# Patient Record
Sex: Female | Born: 1995 | Race: Black or African American | Hispanic: No | Marital: Single | Smoking: Never smoker
Health system: Southern US, Community
[De-identification: ages and names within clinical notes are randomized; demographics above are authoritative.]

## PROBLEM LIST (undated history)

## (undated) ENCOUNTER — Inpatient Hospital Stay (HOSPITAL_COMMUNITY): Payer: Self-pay

## (undated) ENCOUNTER — Ambulatory Visit: Admission: EM | Disposition: A | Discharge status: Home / Self Care | Payer: BC Managed Care – PPO

## (undated) DIAGNOSIS — O24419 Gestational diabetes mellitus in pregnancy, unspecified control: Secondary | ICD-10-CM

## (undated) DIAGNOSIS — E871 Hypo-osmolality and hyponatremia: Secondary | ICD-10-CM

## (undated) DIAGNOSIS — I959 Hypotension, unspecified: Secondary | ICD-10-CM

## (undated) DIAGNOSIS — F419 Anxiety disorder, unspecified: Secondary | ICD-10-CM

## (undated) HISTORY — DX: Hypo-osmolality and hyponatremia: E87.1

## (undated) HISTORY — DX: Hypotension, unspecified: I95.9

---
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
## 2018-02-21 ENCOUNTER — Other Ambulatory Visit: Payer: Self-pay

## 2018-02-21 ENCOUNTER — Emergency Department (HOSPITAL_COMMUNITY): Payer: No Typology Code available for payment source

## 2018-02-21 ENCOUNTER — Emergency Department (HOSPITAL_COMMUNITY)
Admission: EM | Admit: 2018-02-21 | Discharge: 2018-02-21 | Disposition: A | Discharge status: Home / Self Care | Payer: No Typology Code available for payment source | Attending: Emergency Medicine | Admitting: Emergency Medicine

## 2018-02-21 ENCOUNTER — Encounter (HOSPITAL_COMMUNITY): Payer: Self-pay | Admitting: Emergency Medicine

## 2018-02-21 DIAGNOSIS — S29019A Strain of muscle and tendon of unspecified wall of thorax, initial encounter: Secondary | ICD-10-CM

## 2018-02-21 DIAGNOSIS — S39012A Strain of muscle, fascia and tendon of lower back, initial encounter: Secondary | ICD-10-CM | POA: Diagnosis not present

## 2018-02-21 DIAGNOSIS — Y998 Other external cause status: Secondary | ICD-10-CM | POA: Insufficient documentation

## 2018-02-21 DIAGNOSIS — Y92410 Unspecified street and highway as the place of occurrence of the external cause: Secondary | ICD-10-CM | POA: Diagnosis not present

## 2018-02-21 DIAGNOSIS — Y939 Activity, unspecified: Secondary | ICD-10-CM | POA: Diagnosis not present

## 2018-02-21 DIAGNOSIS — S29012A Strain of muscle and tendon of back wall of thorax, initial encounter: Secondary | ICD-10-CM | POA: Insufficient documentation

## 2018-02-21 DIAGNOSIS — S299XXA Unspecified injury of thorax, initial encounter: Secondary | ICD-10-CM | POA: Diagnosis present

## 2018-02-21 MED ORDER — DIAZEPAM 5 MG PO TABS
5.0000 mg | ORAL_TABLET | Freq: Once | ORAL | Status: AC
  Administered 2018-02-21: 5 mg via ORAL
  Filled 2018-02-21: qty 1

## 2018-02-21 MED ORDER — IBUPROFEN 600 MG PO TABS: 600.0000 mg | ORAL_TABLET | Freq: Four times a day (QID) | ORAL | 0 refills | Status: DC | PRN

## 2018-02-21 MED ORDER — KETOROLAC TROMETHAMINE 30 MG/ML IJ SOLN
30.0000 mg | Freq: Once | INTRAMUSCULAR | Status: AC
  Administered 2018-02-21: 30 mg via INTRAMUSCULAR
  Filled 2018-02-21: qty 1

## 2018-02-21 MED ORDER — DIAZEPAM 5 MG PO TABS: 5.0000 mg | ORAL_TABLET | Freq: Two times a day (BID) | ORAL | 0 refills | Status: DC

## 2018-02-21 MED ORDER — HYDROCODONE-ACETAMINOPHEN 5-325 MG PO TABS: 1.0000 | ORAL_TABLET | ORAL | 0 refills | Status: DC | PRN
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
## 2018-05-27 ENCOUNTER — Ambulatory Visit: Payer: Self-pay

## 2018-05-27 NOTE — Telephone Encounter (Signed)
Incoming call from Patient who comlains of having a period, then it stops  Then Patient reports  That she had another period heavy,which included heavy bleeding with mucous.  Very painfull Rates it moderate to severe. Recommended that Patient consult  With GYN.  For further evaluation.  Provided Care advise. Patient reports that She has no Money for the Dr. Melynda Keller transportation.  This occurred 2 to 3 weeks ago  Reason for Disposition . Passed tissue (e.g., gray-white)  Answer Assessment - Initial Assessment Questions 1. LOCATION: "Where does it hurt?"      vaginal 2. ONSET: "When did this episode of pain begin?"       2to 3 weeks ago 3. SEVERITY: "How bad is the pain?" "Are you missing school or work because of the pain?"  (e.g., Scale 1-10; mild, moderate, or severe)   - MILD (1-3): doesn't interfere with normal activities, lasting 1-2 days    - MODERATE (4-7): interferes with normal activities (missing work or school), lasting 2-3 days, some associated GI symptoms    - SEVERE (8-10): excruciating pain, lasting 2-7 days, associated GI symptoms, pain radiating into thighs and back    Severe 4. VAGINAL BLEEDING: "Describe the bleeding that you are having." "How much bleeding is there?"    - SPOTTING: spotting, or pinkish / brownish mucous discharge; does not fill panti-liner or pad    - MILD:  less than 1 pad / hour; less than patient's usual menstrual bleeding   - MODERATE: 1-2 pads / hour; small-medium blood clots (e.g., pea, grape, small coin)    - SEVERE: soaking 2 or more pads/hour for 2 or more hours; bleeding not contained by pads or continuous red blood from vagina; large blood clots (e.g., golf ball, large coin)     More than moderate.   5. MENSTRUAL HISTORY:  "When did this menstrual period begin?", "Is this a normal period for you?"      no 6. LMP:  "When did your last menstrual period begin?"    denies 7. OTHER SYMPTOMS: "What other symptoms are you having with the pain?" (e.g.,  fever, dizzy/lighthead, vomiting, diarrhea, vaginal discharge)    denie8. PREGNANCY: "Is there any chance you are pregnant?" (e.g., unprotected intercourse, missed birth control pill, broken condom)     Had not felt pregnacy Sx.   At all  Protocols used: ABORTION - THREATENED MISCARRIAGE FOLLOW-UP CALL-A-AH, ABDOMINAL PAIN - MENSTRUAL CRAMPS-A-AH

## 2018-08-25 ENCOUNTER — Ambulatory Visit: Payer: Self-pay | Admitting: *Deleted

## 2018-08-25 NOTE — Telephone Encounter (Signed)
Pt reports irregular periods, occurring every 2 weeks, some heaver than others. States this AM mild-moderate bleeding, no clots. Pt reports last month passed large piece of tissue. Had been taking BC pills until 2 months ago, stopped, periods then became irregular. Denies fever, no dizziness or weakness. Reporst 7-8/10 abdominal cramping. Pt does not have PCP or Gyne.  States she will go to [facility] for advise, recommendations. Care advise given per protocol. Pt verbalizes understanding.  Reason for Disposition . [1] Bleeding or spotting between regular periods AND [2] occurs more than three cycles (3 months) this past year  Answer Assessment - Initial Assessment Questions 1. AMOUNT: "Describe the bleeding that you are having."    - SPOTTING: spotting, or pinkish / brownish mucous discharge; does not fill panti-liner or pad    - MILD:  less than 1 pad / hour; less than patient's usual menstrual bleeding   - MODERATE: 1-2 pads / hour; 1 menstrual cup every 6 hours; small-medium blood clots (e.g., pea, grape, small coin)   - SEVERE: soaking 2 or more pads/hour for 2 or more hours; 1 menstrual cup every 2 hours; bleeding not contained by pads or continuous red blood from vagina; large blood clots (e.g., golf ball, large coin)      Mild to moderate today 2. ONSET: "When did the bleeding begin?" "Is it continuing now?"     This AM. Had menses 2 weeks ago 3. MENSTRUAL PERIOD: "When was the last normal menstrual period?" "How is this different than your period?"     2 months ago 4. REGULARITY: "How regular are your periods?"     Regular until 2 months ago 5. ABDOMINAL PAIN: "Do you have any pain?" "How bad is the pain?"  (e.g., Scale 1-10; mild, moderate, or severe)   - MILD (1-3): doesn't interfere with normal activities, abdomen soft and not tender to touch    - MODERATE (4-7): interferes with normal activities or awakens from sleep, tender to touch    - SEVERE (8-10): excruciating pain,  doubled over, unable to do any normal activities      Moderate 7-8/10 6. PREGNANCY: "Could you be pregnant?" "Are you sexually active?" "Did you recently give birth?"     no 7. BREASTFEEDING: "Are you breastfeeding?"     no 8. HORMONES: "Are you taking any hormone medications, prescription or OTC?" (e.g., birth control pills, estrogen)     No. Stopped BC 2 months ago. Took  9. BLOOD THINNERS: "Do you take any blood thinners?" (e.g., Coumadin/warfarin, Pradaxa/dabigatran, aspirin)     no 10. CAUSE: "What do you think is causing the bleeding?" (e.g., recent gyn surgery, recent gyn procedure; known bleeding disorder, cervical cancer, polycystic ovarian disease, fibroids)         Not sure 11. HEMODYNAMIC STATUS: "Are you weak or feeling lightheaded?" If so, ask: "Can you stand and walk normally?"        no 12. OTHER SYMPTOMS: "What other symptoms are you having with the bleeding?" (e.g., passed tissue, vaginal discharge, fever, menstrual-type cramps)      Cramps. Did pass large piece of tissue last month after not having period for 2 months. "Blood is different, very light in color."  Protocols used: VAGINAL BLEEDING - ABNORMAL-A-AH

## 2018-08-26 ENCOUNTER — Emergency Department (HOSPITAL_COMMUNITY)
Admission: EM | Admit: 2018-08-26 | Discharge: 2018-08-26 | Disposition: A | Discharge status: Home / Self Care | Payer: Self-pay | Attending: Emergency Medicine | Admitting: Emergency Medicine

## 2018-08-26 ENCOUNTER — Other Ambulatory Visit: Payer: Self-pay

## 2018-08-26 ENCOUNTER — Emergency Department (HOSPITAL_COMMUNITY): Payer: Self-pay

## 2018-08-26 DIAGNOSIS — K649 Unspecified hemorrhoids: Secondary | ICD-10-CM | POA: Insufficient documentation

## 2018-08-26 DIAGNOSIS — R55 Syncope and collapse: Secondary | ICD-10-CM | POA: Insufficient documentation

## 2018-08-26 DIAGNOSIS — R079 Chest pain, unspecified: Secondary | ICD-10-CM | POA: Insufficient documentation

## 2018-08-26 DIAGNOSIS — F419 Anxiety disorder, unspecified: Secondary | ICD-10-CM | POA: Insufficient documentation

## 2018-08-26 DIAGNOSIS — N939 Abnormal uterine and vaginal bleeding, unspecified: Secondary | ICD-10-CM | POA: Insufficient documentation

## 2018-08-26 DIAGNOSIS — K644 Residual hemorrhoidal skin tags: Secondary | ICD-10-CM

## 2018-08-26 LAB — CBC
HCT: 41.5 % (ref 36.0–46.0)
Hemoglobin: 13.2 g/dL (ref 12.0–15.0)
MCH: 30.7 pg (ref 26.0–34.0)
MCHC: 31.8 g/dL (ref 30.0–36.0)
MCV: 96.5 fL (ref 80.0–100.0)
Platelets: 224 10*3/uL (ref 150–400)
RBC: 4.3 MIL/uL (ref 3.87–5.11)
RDW: 12.6 % (ref 11.5–15.5)
WBC: 8.5 10*3/uL (ref 4.0–10.5)
nRBC: 0 % (ref 0.0–0.2)

## 2018-08-26 LAB — URINALYSIS, ROUTINE W REFLEX MICROSCOPIC
Bacteria, UA: NONE SEEN
Bilirubin Urine: NEGATIVE
Glucose, UA: NEGATIVE mg/dL
Ketones, ur: NEGATIVE mg/dL
Leukocytes,Ua: NEGATIVE
Nitrite: NEGATIVE
Protein, ur: 100 mg/dL — AB
RBC / HPF: >50 RBC/hpf — ABNORMAL HIGH (ref 0–5)
Specific Gravity, Urine: 1.025 (ref 1.005–1.030)
pH: 6 (ref 5.0–8.0)

## 2018-08-26 LAB — CBG MONITORING, ED: Glucose-Capillary: 103 mg/dL — ABNORMAL HIGH (ref 70–99)

## 2018-08-26 LAB — BASIC METABOLIC PANEL
Anion gap: 8 (ref 5–15)
BUN: 11 mg/dL (ref 6–20)
CO2: 24 mmol/L (ref 22–32)
Calcium: 9.1 mg/dL (ref 8.9–10.3)
Chloride: 106 mmol/L (ref 98–111)
Creatinine, Ser: 0.66 mg/dL (ref 0.44–1.00)
GFR calc Af Amer: >60 mL/min (ref >60)
GFR calc non Af Amer: >60 mL/min (ref >60)
Glucose, Bld: 148 mg/dL — ABNORMAL HIGH (ref 70–99)
Potassium: 4.4 mmol/L (ref 3.5–5.1)
Sodium: 138 mmol/L (ref 135–145)

## 2018-08-26 LAB — I-STAT BETA HCG BLOOD, ED (MC, WL, AP ONLY): I-stat hCG, quantitative: <5 m[IU]/mL (ref <5)

## 2018-08-26 MED ORDER — SODIUM CHLORIDE 0.9% FLUSH: 3.0000 mL | Freq: Once | INTRAVENOUS | Status: DC

## 2018-08-26 MED ORDER — NORGESTIMATE-ETH ESTRADIOL 0.25-35 MG-MCG PO TABS: 1.0000 | ORAL_TABLET | Freq: Every day | ORAL | 11 refills | Status: DC

## 2018-08-26 MED ORDER — HYDROXYZINE HCL 25 MG PO TABS: 25.0000 mg | ORAL_TABLET | Freq: Four times a day (QID) | ORAL | 0 refills | Status: DC

## 2018-08-26 NOTE — ED Provider Notes (Signed)
Patient seen/examined in the Emergency Department in conjunction with Midlevel Provider McDonald Patient reports syncopal episode Exam : awake/alert, no distress, resting comfortably Plan: low risk for cardiac dysrhythmia Will d/c home    Ripley Fraise, MD 08/26/18 2352
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
## 2018-08-26 NOTE — Discharge Instructions (Addendum)
Thank you for allowing me to care for you today in the Emergency Department.   Make sure that you are drinking plenty of fluids, at least 64 of water daily.  You should also make sure that you are drinking plenty of fiber so that your bowel movements are soft and you do not have to strain.  Straining will make the hemorrhoid worse.  Start taking 1 tablet of Sprintec daily for your irregular periods.  You can follow-up with the walk-in gynecology clinic.  You can follow-up with [facility] for the episodes that sound consistent with panic attacks.  If you are feeling more anxious or feel 1 of these episodes coming on, you can try taking 1 tablet of hydroxyzine every 6 hours for anxiety.  Make sure that you are not taking this medication while you are drinking alcohol.  I have also provided you with 3 outpatient referrals to follow-up regarding the episodes of passing out that you have been having.  These are all affiliated with the hospital and do not require you to have insurance.  Please call their office to schedule follow-up appointment.  Return to the emergency department if you develop respiratory distress, if you pass out and have new weakness, numbness, changes in your vision, chest pain and shortness of breath that do not resolve, or other new, concerning symptoms.

## 2018-08-26 NOTE — ED Provider Notes (Signed)
[facility] EMERGENCY [facility]PART[facility] Provider Note   CSN: [id number] Arrival date & time: 08/26/18  1644    History   Chief Complaint Chief Complaint  Patient presents with  . Loss of Consciousness    HPI Terri Ramirez is a 23 y.o. female who presents to the emergency department with a chief complaint of syncope.  The patient reports that she had 2 syncopal episodes earlier today.  She reports that both episodes happened earlier today while she was showering.  She reports that she has been having frequent syncopal episodes over the last few months.  She reports that she has had similar episodes while straining on the toilet.  She reports that she had prodromal symptoms prior to the episode coming on and was able to go from a standing position to "kneeling down" into the bathtub.  She denies hitting her head, nausea, vomiting, or headache.  She also reports that she has been having recurrent episodes of chest pain and shortness of breath over the last few months.  She reports that the symptoms come on suddenly and resolve spontaneously within 20 minutes. She characterizes the pain as tightness and having difficulty breathing and states "I feel like I'm going to die."  She reports that there is no correlation between these symptoms and syncopal episodes.  She also reports that she has been having heavy menstrual cycles for the last 2 months.  She was taking an OCP, but discontinue the medication.  Shortly after stopping this medication, she began having heavy, longer cycles accompanied by nausea, and is severe bilateral lower abdominal cramps.  She has not established with an OB/GYN.  She reports that she is not currently sexually active and has previously only been sexually active with one female partner.  She has no concerns for STIs at this time.  She is unable to quantify the amount of vaginal bleeding that she has been experiencing. She does note that she has been more  constipated and has been straining to have BMs and has noticed a small "bump" near her rectum.  She reports worsening depression over the last few months she recently went through a break-up.  She reports that she has had decreased p.o. intake and sometimes will not eat for a day or 2.  She states that for several weeks that she felt so weak that she could barely get off the couch.  During this time, she also endorses heavy, daily alcohol use, but reports that alcohol use has significantly subsided over the last month.  No other IV recreational drug use.  She denies fever, chills, shortness of breath at this time, visual changes, dizziness, lightheadedness, leg swelling, palpitations, diarrhea, hematochezia, melena, or vomiting.  She has not established with a therapist or psychiatrist.  No history of anxiety or depression.  She reports that she has not spent a significant amount of time outside in the heat.  She has no chronic medical problems and takes no daily medications.     The history is provided by the patient. No language interpreter was used.    No past medical history on file.  There are no active problems to display for this patient.   No past surgical history on file.   OB History   No obstetric history on file.      Home Medications    Prior to Admission medications   Medication Sig Start Date End Date Taking? Authorizing Provider  diazepam (VALIUM) 5 MG tablet Take 1 tablet (  5 mg total) by mouth 2 (two) times daily. 02/21/18   Isla Pence, MD  HYDROcodone-acetaminophen (NORCO/VICODIN) 5-325 MG tablet Take 1 tablet by mouth every 4 (four) hours as needed. 02/21/18   Isla Pence, MD  hydrOXYzine (ATARAX/VISTARIL) 25 MG tablet Take 1 tablet (25 mg total) by mouth every 6 (six) hours. 08/26/18   Makenzy Krist A, PA-C  ibuprofen (ADVIL,MOTRIN) 600 MG tablet Take 1 tablet (600 mg total) by mouth every 6 (six) hours as needed. 02/21/18   Isla Pence, MD   norgestimate-ethinyl estradiol ([place] 28) 0.25-35 MG-MCG tablet Take 1 tablet by mouth daily. 08/26/18   Tyffani Foglesong A, PA-C    Family History No family history on file.  Social History Social History   Tobacco Use  . Smoking status: Not on file  Substance Use Topics  . Alcohol use: Not on file  . Drug use: Not on file     Allergies   Patient has no known allergies.   Review of Systems Review of Systems  Constitutional: Negative for activity change, chills and fever.  HENT: Negative for congestion, sinus pressure, sinus pain and sore throat.   Eyes: Negative for visual disturbance.  Respiratory: Positive for shortness of breath. Negative for cough and wheezing.   Cardiovascular: Positive for chest pain. Negative for palpitations.  Gastrointestinal: Positive for abdominal pain, constipation and nausea. Negative for abdominal distention, anal bleeding, blood in stool, diarrhea and vomiting.  Genitourinary: Positive for menstrual problem and vaginal bleeding. Negative for dysuria, flank pain, frequency, urgency, vaginal discharge and vaginal pain.  Musculoskeletal: Negative for arthralgias, back pain, joint swelling, myalgias, neck pain and neck stiffness.  Skin: Negative for rash.  Allergic/Immunologic: Negative for immunocompromised state.  Neurological: Negative for dizziness, weakness, numbness and headaches.  Psychiatric/Behavioral: Positive for dysphoric mood. Negative for confusion, self-injury and sleep disturbance. The patient is nervous/anxious. The patient is not hyperactive.    Physical Exam Updated Vital Signs BP 120/78   Pulse 65   Temp 98.2 F (36.8 C) (Oral)   Resp 15   LMP  (LMP Unknown)   SpO2 100%   Physical Exam Vitals signs and nursing note reviewed.  Constitutional:      General: She is not in acute distress. HENT:     Head: Normocephalic.  Eyes:     Conjunctiva/sclera: Conjunctivae normal.  Neck:     Musculoskeletal: Neck supple.   Cardiovascular:     Rate and Rhythm: Normal rate and regular rhythm.     Pulses: Normal pulses.          Radial pulses are 2+ on the right side and 2+ on the left side.       Dorsalis pedis pulses are 2+ on the right side and 2+ on the left side.     Heart sounds: Normal heart sounds. No murmur. No friction rub. No gallop.   Pulmonary:     Effort: Pulmonary effort is normal. No respiratory distress.     Breath sounds: No stridor. No wheezing, rhonchi or rales.  Chest:     Chest wall: No tenderness.  Abdominal:     General: There is no distension.     Palpations: Abdomen is soft. There is no mass.     Tenderness: There is no right CVA tenderness, left CVA tenderness, guarding or rebound.     Hernia: No hernia is present.     Comments: Abdomen is soft, nondistended.  Normoactive bowel sounds.  Minimal discomfort with palpation in the bilateral lower  quadrants.  No rebound or guarding.  No tenderness over McBurney's point.  Genitourinary:    Comments: There is a small, nonthrombosed external hemorrhoid. Musculoskeletal:     Right lower leg: No edema.     Left lower leg: No edema.  Skin:    General: Skin is warm.     Findings: No rash.  Neurological:     Mental Status: She is alert.     Comments: Gait is not ataxic.  No focal neurologic deficits.  Psychiatric:        Thought Content: Thought content does not include homicidal or suicidal ideation.     Comments: Anxious appearing      ED Treatments / Results  Labs (all labs ordered are listed, but only abnormal results are displayed) Labs Reviewed  BASIC METABOLIC PANEL - Abnormal; Notable for the following components:      Result Value   Glucose, Bld 148 (*)    All other components within normal limits  URINALYSIS, ROUTINE W REFLEX MICROSCOPIC - Abnormal; Notable for the following components:   Color, Urine AMBER (*)    APPearance CLOUDY (*)    Hgb urine dipstick LARGE (*)    Protein, ur 100 (*)    RBC / HPF >50 (*)     All other components within normal limits  CBG MONITORING, ED - Abnormal; Notable for the following components:   Glucose-Capillary 103 (*)    All other components within normal limits  CBC  I-STAT BETA HCG BLOOD, ED ([facility], [facility], [facility] ONLY)    EKG EKG Interpretation  Date/Time:  Wednesday August 26 2018 17:16:20 EDT Ventricular Rate:  71 PR Interval:  114 QRS Duration: 90 QT Interval:  374 QTC Calculation: 406 R Axis:   81 Text Interpretation:  Normal sinus rhythm with sinus arrhythmia Normal ECG No previous ECGs available Confirmed by Zadie RhineWickline, Donald ([id number]) on 08/26/2018 11:08:45 PM   Radiology Dg Chest 2 View  Result Date: 08/26/2018 CLINICAL DATA:  Shortness of breath EXAM: CHEST - 2 VIEW COMPARISON:  None. FINDINGS: The heart size and mediastinal contours are within normal limits. Both lungs are clear. The visualized skeletal structures are unremarkable. IMPRESSION: No acute cardiopulmonary process. Electronically Signed   By: Jonna ClarkBindu  Avutu M.D.   On: 08/26/2018 23:26    Procedures Procedures (including critical care time)  Medications Ordered in ED Medications  sodium chloride flush (NS) 0.9 % injection 3 mL (has no administration in time range)     Initial Impression / Assessment and Plan / ED Course  I have reviewed the triage vital signs and the nursing notes.  Pertinent labs & imaging results that were available during my care of the patient were reviewed by me and considered in my medical decision making (see chart for details).        23 year old female with no pertinent past medical history presenting to the ER with multiple complaints.  She has been having recurrent syncopal episodes for the last few months and had 2 episodes earlier today while in the shower.  No focal neurologic deficits on exam.  EKG with sinus arrhythmia, but otherwise unremarkable.  Labs are reassuring.  Strong suspicion for vasovagal syncope given recurrent episodes.  I suspect the symptoms  are exacerbated by poor p.o. intake and depression after the patient recently went through a break-up over the last few months.  She reports that she is also been constipated and did have an external hemorrhoid that she asked me to evaluate on her  exam.  Patient was strongly encouraged to increase her fluid intake as well as increasing the amount of fiber in her diet.  She was ambulated in the department and did not have any dizziness or lightheadedness.  She has also been having abnormal uterine bleeding for the last few months since discontinuing her home OCP.  Hemoglobin is normal today.  Will restart the patient on OCP and provide her with follow-up to the walk-in gynecology clinic.  Work-up is not concerning for symptomatic anemia.  She also has been having episodes of chest pain or shortness of breath over the last few months that resolved within 20 minutes.  She is asymptomatic at this time.  Symptoms sound very concerning for recurrent panic attacks.  She does seem very anxious on exam today and has endorsed worsening depression after going through a break-up for the last few months.  Of note, the symptoms are not associated with syncopal episodes that she has been having.  Chest x-ray was unremarkable.  Symptoms do not sound consistent with ACS.  She is PERC negative.  Will start the patient on hydroxyzine and provide her with a follow-up to monitor.   The patient was seen and independently evaluated by Dr. Bebe ShaggyWickline, attending physician.  She is hemodynamically stable and in no acute distress.  Safe for discharge home with outpatient follow-up.  Final Clinical Impressions(s) / ED Diagnoses   Final diagnoses:  Syncope, unspecified syncope type  Abnormal uterine and vaginal bleeding, unspecified  External hemorrhoid  Anxiety    ED Discharge Orders         Ordered    norgestimate-ethinyl estradiol ([place] 28) 0.25-35 MG-MCG tablet  Daily     08/26/18 2307    hydrOXYzine (ATARAX/VISTARIL)  25 MG tablet  Every 6 hours     08/26/18 2307           Frederik PearMcDonald, Airik Goodlin A, PA-C 08/27/18 0125    Zadie RhineWickline, Donald, MD 08/27/18 [phone]

## 2018-08-26 NOTE — ED Notes (Signed)
Discharge instructions discussed with pt. Pt verbalized understanding. No questions at this time. Pt to go home with brother.

## 2018-08-26 NOTE — ED Triage Notes (Signed)
Pt reports while in the shower she had a syncope episode. Pt reports irregular periods and in the last 2 months has had 2 full menstrual periods and just restarted yesterday. Pt currently has generalized abd pain with nausea as well.

## 2018-08-29 ENCOUNTER — Telehealth: Payer: Self-pay

## 2018-08-29 NOTE — Telephone Encounter (Signed)
Received call from ED CSW that patient was having difficulty getting Rx's. Called patient and she said when she went through the drive through to get her prescriptions they were not ready. She stated she was not aware she needed a prescription for them. Per ED records, prescriptions were printed. CM informed her that they should be with her DC papers, for her to look for them and take them to the pharmacy. Instructed Terri Ramirez that if she could not find them to call me back.

## 2018-11-17 ENCOUNTER — Emergency Department (HOSPITAL_COMMUNITY): Payer: Self-pay

## 2018-11-17 ENCOUNTER — Other Ambulatory Visit: Payer: Self-pay

## 2018-11-17 ENCOUNTER — Emergency Department (HOSPITAL_COMMUNITY)
Admission: EM | Admit: 2018-11-17 | Discharge: 2018-11-17 | Disposition: A | Discharge status: Home / Self Care | Payer: Self-pay | Attending: Emergency Medicine | Admitting: Emergency Medicine

## 2018-11-17 ENCOUNTER — Encounter (HOSPITAL_COMMUNITY): Payer: Self-pay | Admitting: Emergency Medicine

## 2018-11-17 DIAGNOSIS — Z79899 Other long term (current) drug therapy: Secondary | ICD-10-CM | POA: Insufficient documentation

## 2018-11-17 DIAGNOSIS — N12 Tubulo-interstitial nephritis, not specified as acute or chronic: Secondary | ICD-10-CM

## 2018-11-17 LAB — URINALYSIS, ROUTINE W REFLEX MICROSCOPIC
Bacteria, UA: NONE SEEN
Bilirubin Urine: NEGATIVE
Glucose, UA: NEGATIVE mg/dL
Hgb urine dipstick: NEGATIVE
Ketones, ur: 80 mg/dL — AB
Nitrite: NEGATIVE
Protein, ur: 100 mg/dL — AB
Specific Gravity, Urine: 1.01 (ref 1.005–1.030)
WBC, UA: >50 WBC/hpf — ABNORMAL HIGH (ref 0–5)
pH: 6 (ref 5.0–8.0)

## 2018-11-17 LAB — CBC
HCT: 36.5 % (ref 36.0–46.0)
Hemoglobin: 12.4 g/dL (ref 12.0–15.0)
MCH: 31.2 pg (ref 26.0–34.0)
MCHC: 34 g/dL (ref 30.0–36.0)
MCV: 91.7 fL (ref 80.0–100.0)
Platelets: 213 10*3/uL (ref 150–400)
RBC: 3.98 MIL/uL (ref 3.87–5.11)
RDW: 11.7 % (ref 11.5–15.5)
WBC: 18.5 10*3/uL — ABNORMAL HIGH (ref 4.0–10.5)
nRBC: 0 % (ref 0.0–0.2)

## 2018-11-17 LAB — LIPASE, BLOOD: Lipase: 20 U/L (ref 11–51)

## 2018-11-17 LAB — COMPREHENSIVE METABOLIC PANEL
ALT: 16 U/L (ref 0–44)
AST: 13 U/L — ABNORMAL LOW (ref 15–41)
Albumin: 4 g/dL (ref 3.5–5.0)
Alkaline Phosphatase: 69 U/L (ref 38–126)
Anion gap: 12 (ref 5–15)
BUN: <5 mg/dL — ABNORMAL LOW (ref 6–20)
CO2: 21 mmol/L — ABNORMAL LOW (ref 22–32)
Calcium: 9 mg/dL (ref 8.9–10.3)
Chloride: 101 mmol/L (ref 98–111)
Creatinine, Ser: 0.69 mg/dL (ref 0.44–1.00)
GFR calc Af Amer: >60 mL/min (ref >60)
GFR calc non Af Amer: >60 mL/min (ref >60)
Glucose, Bld: 128 mg/dL — ABNORMAL HIGH (ref 70–99)
Potassium: 3.4 mmol/L — ABNORMAL LOW (ref 3.5–5.1)
Sodium: 134 mmol/L — ABNORMAL LOW (ref 135–145)
Total Bilirubin: 1.6 mg/dL — ABNORMAL HIGH (ref 0.3–1.2)
Total Protein: 8.1 g/dL (ref 6.5–8.1)

## 2018-11-17 LAB — I-STAT BETA HCG BLOOD, ED (MC, WL, AP ONLY): I-stat hCG, quantitative: <5 m[IU]/mL (ref <5)

## 2018-11-17 LAB — LACTIC ACID, PLASMA: Lactic Acid, Venous: 0.9 mmol/L (ref 0.5–1.9)

## 2018-11-17 MED ORDER — SODIUM CHLORIDE 0.9 % IV BOLUS
1000.0000 mL | Freq: Once | INTRAVENOUS | Status: AC
  Administered 2018-11-17: 1000 mL via INTRAVENOUS

## 2018-11-17 MED ORDER — FLUCONAZOLE 150 MG PO TABS: 150.0000 mg | ORAL_TABLET | Freq: Once | ORAL | 0 refills | Status: AC

## 2018-11-17 MED ORDER — IOHEXOL 300 MG/ML  SOLN
100.0000 mL | Freq: Once | INTRAMUSCULAR | Status: AC | PRN
  Administered 2018-11-17: 100 mL via INTRAVENOUS

## 2018-11-17 MED ORDER — PHENAZOPYRIDINE HCL 200 MG PO TABS: 200.0000 mg | ORAL_TABLET | Freq: Three times a day (TID) | ORAL | 0 refills | Status: DC

## 2018-11-17 MED ORDER — MORPHINE SULFATE (PF) 4 MG/ML IV SOLN
4.0000 mg | Freq: Once | INTRAVENOUS | Status: AC
  Administered 2018-11-17: 4 mg via INTRAVENOUS
  Filled 2018-11-17: qty 1

## 2018-11-17 MED ORDER — ONDANSETRON HCL 4 MG/2ML IJ SOLN
4.0000 mg | Freq: Once | INTRAMUSCULAR | Status: AC
  Administered 2018-11-17: 4 mg via INTRAVENOUS
  Filled 2018-11-17: qty 2

## 2018-11-17 MED ORDER — HYDROCODONE-ACETAMINOPHEN 5-325 MG PO TABS: 1.0000 | ORAL_TABLET | Freq: Four times a day (QID) | ORAL | 0 refills | Status: DC | PRN

## 2018-11-17 MED ORDER — CEPHALEXIN 500 MG PO CAPS: 500.0000 mg | ORAL_CAPSULE | Freq: Four times a day (QID) | ORAL | 0 refills | Status: AC

## 2018-11-17 MED ORDER — ONDANSETRON 4 MG PO TBDP: 4.0000 mg | ORAL_TABLET | Freq: Three times a day (TID) | ORAL | 0 refills | Status: DC | PRN

## 2018-11-17 MED ORDER — SODIUM CHLORIDE 0.9 % IV SOLN
1.0000 g | Freq: Once | INTRAVENOUS | Status: AC
  Administered 2018-11-17: 1 g via INTRAVENOUS
  Filled 2018-11-17: qty 10

## 2018-11-17 NOTE — ED Triage Notes (Signed)
Pt c/o abd pains, fatigue, nausea, urinating a lot. Reports when she holds her breath too long will start coughing.

## 2018-11-17 NOTE — ED Provider Notes (Signed)
Michalla Ringer is a 23 y.o. female, presenting to the ED with abdominal pain.  Accompanied by nausea and back pain, mostly on the left.  HPI from Alecia Lemming, PA-C: "Patient with no past surgical history presents with multiple complaints.  Patient states that over the past 1 and 1/2 weeks she has had generalized abdominal pain with associated nausea.  She states that she has been very fatigued and that her heart has been racing at times and she feels short of breath.  Associated CP that is generalized.  She denies any fevers, URI symptoms, sore throat, or persistent cough.  She has had dysuria with increased frequency and urgency and "very yellow" urine.  She denies any vaginal discharge or bleeding.  She is sexually active with 1 partner. Last menstrual period was 1 to 2 weeks ago and was normal for her.  She denies any diarrhea or constipation or blood in the stool.  No treatments prior to arrival.  No known sick contacts including those with coronavirus.  Patient denies risk factors for pulmonary embolism including: unilateral leg swelling, history of DVT/PE/other blood clotsrecent immobilizations, recent surgery, recent travel (>4hr segment), malignancy, hemoptysis. She was restarted on OCP during ED visit in 08/2018."  History reviewed. No pertinent past medical history.    Physical Exam  BP (!) 153/90   Pulse (!) 115   Temp 99.6 F (37.6 C) (Oral)   Resp 19   LMP 11/04/2018   SpO2 100%   Physical Exam Vitals signs and nursing note reviewed.  Constitutional:      General: She is not in acute distress.    Appearance: She is well-developed. She is not diaphoretic.  HENT:     Head: Normocephalic and atraumatic.     Mouth/Throat:     Mouth: Mucous membranes are moist.     Pharynx: Oropharynx is clear.  Eyes:     Conjunctiva/sclera: Conjunctivae normal.  Neck:     Musculoskeletal: Neck supple.  Cardiovascular:     Rate and Rhythm: Normal rate and regular rhythm.     Pulses: Normal  pulses.          Radial pulses are 2+ on the right side and 2+ on the left side.       Posterior tibial pulses are 2+ on the right side and 2+ on the left side.     Heart sounds: Normal heart sounds.     Comments: Tactile temperature in the extremities appropriate and equal bilaterally. Pulmonary:     Effort: Pulmonary effort is normal. No respiratory distress.     Breath sounds: Normal breath sounds.  Abdominal:     Palpations: Abdomen is soft.     Tenderness: There is generalized abdominal tenderness. There is left CVA tenderness. There is no right CVA tenderness or guarding.  Musculoskeletal:     Right lower leg: No edema.     Left lower leg: No edema.  Lymphadenopathy:     Cervical: No cervical adenopathy.  Skin:    General: Skin is warm and dry.  Neurological:     Mental Status: She is alert.  Psychiatric:        Mood and Affect: Mood and affect normal.        Speech: Speech normal.        Behavior: Behavior normal.     ED Course/Procedures    Procedures   Abnormal Labs Reviewed  COMPREHENSIVE METABOLIC PANEL - Abnormal; Notable for the following components:  Result Value   Sodium 134 (*)    Potassium 3.4 (*)    CO2 21 (*)    Glucose, Bld 128 (*)    BUN <5 (*)    AST 13 (*)    Total Bilirubin 1.6 (*)    All other components within normal limits  CBC - Abnormal; Notable for the following components:   WBC 18.5 (*)    All other components within normal limits  URINALYSIS, ROUTINE W REFLEX MICROSCOPIC - Abnormal; Notable for the following components:   Ketones, ur 80 (*)    Protein, ur 100 (*)    Leukocytes,Ua MODERATE (*)    WBC, UA >50 (*)    All other components within normal limits    Ct Abdomen Pelvis W Contrast  Result Date: 11/17/2018 CLINICAL DATA:  Abdominal pain with fatigue and nausea EXAM: CT ABDOMEN AND PELVIS WITH CONTRAST TECHNIQUE: Multidetector CT imaging of the abdomen and pelvis was performed using the standard protocol following bolus  administration of intravenous contrast. CONTRAST:  OMNIPAQUE IOHEXOL 300 MG/ML  SOLN COMPARISON:  None. FINDINGS: Lower chest: No acute abnormality. Hepatobiliary: No focal liver abnormality is seen. No gallstones, gallbladder wall thickening, or biliary dilatation. Pancreas: Unremarkable. No pancreatic ductal dilatation or surrounding inflammatory changes. Spleen: Normal in size without focal abnormality. Adrenals/Urinary Tract: Adrenal glands are normal. No hydronephrosis. Areas of hypoenhancement within the left greater than right kidneys, most notable in the upper poles. Urinary bladder is slightly thick walled. Stomach/Bowel: Stomach is within normal limits. Appendix appears normal. No evidence of bowel wall thickening, distention, or inflammatory changes. Vascular/Lymphatic: No significant vascular findings are present. No enlarged abdominal or pelvic lymph nodes. Reproductive: Uterus and bilateral adnexa are unremarkable. Other: Negative for free air or free fluid Musculoskeletal: No acute or significant osseous findings. IMPRESSION: 1. Slightly thick-walled appearance of the urinary bladder, possible cystitis. Patchy areas of hypoenhancement involving the left greater than right kidneys, suspect for acute pyelonephritis in the appropriate clinical setting. No hydronephrosis. Electronically Signed   By: Jasmine Pang M.D.   On: 11/17/2018 18:01   Dg Chest Port 1 View  Result Date: 11/17/2018 CLINICAL DATA:  Palpitations EXAM: PORTABLE CHEST 1 VIEW COMPARISON:  August 26, 2018 FINDINGS: The heart size and mediastinal contours are within normal limits. Both lungs are clear. The visualized skeletal structures are unremarkable. IMPRESSION: No active disease. Electronically Signed   By: Guadlupe Spanish M.D.   On: 11/17/2018 15:53    MDM        Patient care handoff report received from [facility], [place]. Plan: Patient awaiting CT abdomen/pelvis.   Patient presents with generalized abdominal  pain for the last 7 to 10 days that she describes as a bloating or soreness.  She has had suprapubic pressure, especially after urination, for the past 2 weeks that she states is consistent with previous UTIs. Patient is nontoxic appearing, afebrile, not tachycardic on my exam, not tachypneic, not hypotensive, maintains excellent SPO2 on room air, and is in no apparent distress.  Evidence of cystitis and possible pyelonephritis on CT, correlates with findings on UA. Pain adequately managed during her ED course. Tolerating p.o. at time of discharge. When I informed the patient that her requested pharmacy would be closed by the time she was discharged, she states she will pick up her prescriptions tomorrow. Requests antifungal due to history of yeast infections with antibiotic use. The patient was given instructions for home care as well as return precautions. Patient voices understanding of  these instructions, accepts the plan, and is comfortable with discharge.  Vitals:   11/17/18 1203 11/17/18 1544 11/17/18 1556  BP: (!) 153/90 (!) 141/77 134/82  Pulse: (!) 115 94 98  Resp: 19 16 18   Temp: 99.6 F (37.6 C) 98 F (36.7 C)   TempSrc: Oral Oral   SpO2: 100% 100% 100%      Concepcion LivingJoy, Roverto Bodmer C, PA-C 11/17/18 1916    Bethann BerkshireZammit, Joseph, MD 11/17/18 2213

## 2018-11-17 NOTE — Discharge Instructions (Addendum)
Pyelonephritis  There is evidence of an infection in the kidney called pyelonephritis.  Pain/Fever:  Antiinflammatory medications: Take 600 mg of ibuprofen every 6 hours or 440 mg (over the counter dose) to 500 mg (prescription dose) of naproxen every 12 hours for the next 3 days. After this time, these medications may be used as needed for pain. Take these medications with food to avoid upset stomach. Choose only one of these medications, do not take them together. Acetaminophen (generic for Tylenol): Should you continue to have additional pain while taking the ibuprofen or naproxen, you may add in acetaminophen as needed. Your daily total maximum amount of acetaminophen from all sources should be limited to 4000mg /day for persons without liver problems, or 2000mg /day for those with liver problems. Vicodin: May take Vicodin (hydrocodone-acetaminophen) as needed for severe pain.   Do not drive or perform other dangerous activities while taking this medication as it can cause drowsiness as well as changes in reaction time and judgement.   Please note that each pill of Vicodin contains 325 mg of acetaminophen (generic for Tylenol) and the above dosage limits apply.  Nausea/vomiting: Use the ondansetron (generic for Zofran) for nausea or vomiting.  This medication may not prevent all vomiting or nausea, but can help facilitate better hydration. Things that can help with nausea/vomiting also include peppermint/menthol candies, vitamin B12, and ginger.  Antibiotics: Please take all of your antibiotics until finished!   You may develop abdominal discomfort or diarrhea from the antibiotic.  You may help offset this with probiotics which you can buy or get in yogurt. Do not eat or take the probiotics until 2 hours after your antibiotic.   Hydration: Symptoms of any other illness will be intensified and complicated by dehydration. Dehydration can also extend the duration of symptoms. Dehydration typically  causes its own symptoms including lightheadedness, nausea, headaches, fatigue increased thirst, and generally feeling unwell. Drink plenty of fluids and get plenty of rest. You should be drinking at least half a liter of water every hour or two to stay hydrated. Electrolyte drinks (ex. Gatorade, Powerade, Pedialyte) are also encouraged. You should be drinking enough fluids to make your urine light yellow, almost clear. If this is not the case, you are not drinking enough water.  Follow-up: Most instances of pyelonephritis may be followed up upon by a primary care provider.  Should symptoms fail to begin to resolve after a few days or they fail to resolve by the end of the antibiotic course, follow-up with a urologist.  Return: Return to the emergency department for significantly worsening pain, inability or significant difficulty urinating, uncontrolled vomiting, spreading pain, or any other major concerns.

## 2018-11-17 NOTE — TOC Initial Note (Signed)
Transition of Care [facility]) - Initial/Assessment Note    Patient Details  Name: Terri Ramirez MRN: [id number] Date of Birth: 09-Mar-1995  Transition of Care [facility]) CM/SW Contact:    Erenest Rasher, RN Phone Number: [phone] 11/17/2018, 4:36 PM  Clinical Narrative:   2 ED visits in 6 months, no insurance, no PCP                Spoke to pt and states she uses goodrx for her meds. Will check price of meds to ensure reasonable for pt to pay out of pocket. She does not work and currently without insurance. appt arranged for [facility] on 12/09/2018 at 3:30 pm. Provided pt with brochure for clinic with appt time.    Expected Discharge Plan: Home/Self Care Barriers to Discharge: No Barriers Identified   Patient Goals and CMS Choice        Expected Discharge Plan and Services Expected Discharge Plan: Home/Self Care In-house Referral: NA Discharge Planning Services: CM Consult, Follow-up appt scheduled, Medication Assistance   Living arrangements for the past 2 months: Apartment                                      Prior Living Arrangements/Services Living arrangements for the past 2 months: Apartment Lives with:: Self Patient language and need for interpreter reviewed:: Yes Do you feel safe going back to the place where you live?: Yes      Need for Family Participation in Patient Care: No (Comment)     Criminal Activity/Legal Involvement Pertinent to Current Situation/Hospitalization: No - Comment as needed  Activities of Daily Living      Permission Sought/Granted Permission sought to share information with : Case Manager, PCP, Family Supports Permission granted to share information with : Yes, Verbal Permission Granted              Emotional Assessment Appearance:: Appears stated age Attitude/Demeanor/Rapport: Gracious, Engaged Affect (typically observed): Accepting Orientation: : Oriented to Self, Oriented to Place, Oriented to  Time,  Oriented to Situation Alcohol / Substance Use: Tobacco Use Psych Involvement: No (comment)  Admission diagnosis:  Nausea, BAck pain, abd PAin, rapid heartbeat There are no active problems to display for this patient.  PCP:  Patient, No Pcp Per Pharmacy:  No Pharmacies Listed    Social Determinants of Health (SDOH) Interventions    Readmission Risk Interventions No flowsheet data found.

## 2018-11-17 NOTE — ED Provider Notes (Signed)
[facility]-EMERGENCY DEPT Provider Note   CSN: [id number] Arrival date & time: 11/17/18  1132     History   Chief Complaint Chief Complaint  Patient presents with  . Abdominal Pain  . Fatigue  . Nausea    HPI Terri Ramirez is a 23 y.o. female.     Patient with no past surgical history presents with multiple complaints.  Patient states that over the past 1 and 1/2 weeks she has had generalized abdominal pain with associated nausea.  She states that she has been very fatigued and that her heart has been racing at times and she feels short of breath.  Associated CP that is generalized.  She denies any fevers, URI symptoms, sore throat, or persistent cough.  She has had dysuria with increased frequency and urgency and "very yellow" urine.  She denies any vaginal discharge or bleeding.  She is sexually active with 1 partner. Last menstrual period was 1 to 2 weeks ago and was normal for her.  She denies any diarrhea or constipation or blood in the stool.  No treatments prior to arrival.  No known sick contacts including those with coronavirus.  Patient denies risk factors for pulmonary embolism including: unilateral leg swelling, history of DVT/PE/other blood clotsrecent immobilizations, recent surgery, recent travel (>4hr segment), malignancy, hemoptysis. She was restarted on OCP during ED visit in 08/2018.       History reviewed. No pertinent past medical history.  There are no active problems to display for this patient.   History reviewed. No pertinent surgical history.   OB History   No obstetric history on file.      Home Medications    Prior to Admission medications   Medication Sig Start Date End Date Taking? Authorizing Provider  diazepam (VALIUM) 5 MG tablet Take 1 tablet (5 mg total) by mouth 2 (two) times daily. 02/21/18   Jacalyn LefevreHaviland, Julie, MD  HYDROcodone-acetaminophen (NORCO/VICODIN) 5-325 MG tablet Take 1 tablet by mouth every 4 (four) hours  as needed. 02/21/18   Jacalyn LefevreHaviland, Julie, MD  hydrOXYzine (ATARAX/VISTARIL) 25 MG tablet Take 1 tablet (25 mg total) by mouth every 6 (six) hours. 08/26/18   McDonald, Mia A, PA-C  ibuprofen (ADVIL,MOTRIN) 600 MG tablet Take 1 tablet (600 mg total) by mouth every 6 (six) hours as needed. 02/21/18   Jacalyn LefevreHaviland, Julie, MD  norgestimate-ethinyl estradiol (SPRINTEC 28) 0.25-35 MG-MCG tablet Take 1 tablet by mouth daily. 08/26/18   McDonald, Mia A, PA-C    Family History No family history on file.  Social History Social History   Tobacco Use  . Smoking status: Never Smoker  . Smokeless tobacco: Never Used  Substance Use Topics  . Alcohol use: Not on file  . Drug use: Yes    Types: Marijuana     Allergies   Patient has no known allergies.   Review of Systems Review of Systems  Constitutional: Positive for fatigue. Negative for fever.  HENT: Negative for rhinorrhea and sore throat.   Eyes: Negative for redness.  Respiratory: Positive for shortness of breath. Negative for cough.   Cardiovascular: Positive for chest pain and palpitations.  Gastrointestinal: Positive for abdominal pain and nausea. Negative for diarrhea and vomiting.  Genitourinary: Positive for dysuria, frequency and urgency. Negative for vaginal bleeding and vaginal discharge.  Musculoskeletal: Negative for myalgias.  Skin: Negative for rash.  Neurological: Negative for headaches.     Physical Exam Updated Vital Signs BP (!) 153/90   Pulse (!) 115  Temp 99.6 F (37.6 C) (Oral)   Resp 19   LMP 11/04/2018   SpO2 100%   Physical Exam Vitals signs and nursing note reviewed.  Constitutional:      Appearance: She is well-developed.  HENT:     Head: Normocephalic and atraumatic.  Eyes:     General:        Right eye: No discharge.        Left eye: No discharge.     Conjunctiva/sclera: Conjunctivae normal.  Neck:     Musculoskeletal: Normal range of motion and neck supple.  Cardiovascular:     Rate and Rhythm:  Regular rhythm. Tachycardia present.     Heart sounds: Normal heart sounds.  Pulmonary:     Effort: Pulmonary effort is normal.     Breath sounds: Normal breath sounds.  Abdominal:     Palpations: Abdomen is soft.     Tenderness: There is generalized abdominal tenderness (mild-moderate). There is right CVA tenderness and left CVA tenderness. Negative signs include Murphy's sign, Rovsing's sign and McBurney's sign.  Skin:    General: Skin is warm and dry.  Neurological:     Mental Status: She is alert.      ED Treatments / Results  Labs (all labs ordered are listed, but only abnormal results are displayed) Labs Reviewed  COMPREHENSIVE METABOLIC PANEL - Abnormal; Notable for the following components:      Result Value   Sodium 134 (*)    Potassium 3.4 (*)    CO2 21 (*)    Glucose, Bld 128 (*)    BUN <5 (*)    AST 13 (*)    Total Bilirubin 1.6 (*)    All other components within normal limits  CBC - Abnormal; Notable for the following components:   WBC 18.5 (*)    All other components within normal limits  URINALYSIS, ROUTINE W REFLEX MICROSCOPIC - Abnormal; Notable for the following components:   Ketones, ur 80 (*)    Protein, ur 100 (*)    Leukocytes,Ua MODERATE (*)    WBC, UA >50 (*)    All other components within normal limits  URINE CULTURE  LIPASE, BLOOD  LACTIC ACID, PLASMA  I-STAT BETA HCG BLOOD, ED ([facility], [facility], [facility] ONLY)    EKG None  Radiology No results found.  Procedures Procedures (including critical care time)  Medications Ordered in ED Medications  sodium chloride 0.9 % bolus 1,000 mL (has no administration in time range)  cefTRIAXone (ROCEPHIN) 1 g in sodium chloride 0.9 % 100 mL IVPB (has no administration in time range)  morphine 4 MG/ML injection 4 mg (has no administration in time range)  ondansetron (ZOFRAN) injection 4 mg (has no administration in time range)  sodium chloride 0.9 % bolus 1,000 mL (1,000 mLs Intravenous New Bag/Given 11/17/18  1444)     Initial Impression / Assessment and Plan / ED Course  I have reviewed the triage vital signs and the nursing notes.  Pertinent labs & imaging results that were available during my care of the patient were reviewed by me and considered in my medical decision making (see chart for details).        Patient seen and examined. Work-up initiated. Medications ordered.  Fluid bolus ordered.  Added lactate.  White blood cell count noted to be 18.5 thousand.   Vital signs reviewed and are as follows: BP (!) 153/90   Pulse (!) 115   Temp 99.6 F (37.6 C) (Oral)  Resp 19   LMP 11/04/2018   SpO2 100%   UA suggestive of UTI.  Patient symptoms could be due to pyelonephritis, however she has abdominal pain that is out of proportion to what I would typically see with this.  For this reason, will obtain CT scan given tachycardia, abdominal pain, elevated white blood cell count.  Signout to [employer] at shift change.  If CT imaging is reassuring and chest x-ray is clear, she can be discharged to home on antibiotics for pyelonephritis.  She will be given Rocephin and IV fluids while waiting for CT.  Urine culture sent.  Patient continues to look nontoxic on reexam.  Final Clinical Impressions(s) / ED Diagnoses   Final diagnoses:  None   Pending completion of work-up.  ED Discharge Orders    None       Renne Crigler, [place] 11/17/18 1543    Sabas Sous, MD 11/19/18 1455

## 2018-11-17 NOTE — ED Notes (Signed)
Pt verbalized discharge instructions and follow up care. Alert and ambulatory. No IV.  

## 2018-11-20 LAB — URINE CULTURE: Culture: 40000 — AB

## 2018-11-21 ENCOUNTER — Telehealth: Payer: Self-pay | Admitting: Emergency Medicine

## 2018-11-21 NOTE — Progress Notes (Signed)
ED Antimicrobial Stewardship Positive Culture Follow Up   Terri Ramirez is an 23 y.o. female who presented to [facility] on 11/17/2018 with a chief complaint of  Chief Complaint  Patient presents with  . Abdominal Pain  . Fatigue  . Nausea    Recent Results (from the past 720 hour(s))  Urine Culture     Status: Abnormal   Collection Time: 11/17/18  2:45 PM   Specimen: Urine, Clean Catch  Result Value Ref Range Status   Specimen Description   Final    URINE, CLEAN CATCH Performed at [facility], [place] [address], [place], [place] [address]    Special Requests   Final    NONE Performed at [facility], [place] [address], [place], [place] [address]    Culture 40,000 COLONIES/mL STAPHYLOCOCCUS SAPROPHYTICUS (A)  Final   Report Status 11/20/2018 FINAL  Final   Organism ID, Bacteria STAPHYLOCOCCUS SAPROPHYTICUS (A)  Final      Susceptibility   Staphylococcus saprophyticus - MIC*    CIPROFLOXACIN <=0.5 SENSITIVE Sensitive     GENTAMICIN <=0.5 SENSITIVE Sensitive     NITROFURANTOIN <=16 SENSITIVE Sensitive     OXACILLIN 2 RESISTANT Resistant     TETRACYCLINE <=1 SENSITIVE Sensitive     VANCOMYCIN 1 SENSITIVE Sensitive     TRIMETH/SULFA <=10 SENSITIVE Sensitive     CLINDAMYCIN <=0.25 SENSITIVE Sensitive     RIFAMPIN <=0.5 SENSITIVE Sensitive     Inducible Clindamycin NEGATIVE Sensitive     * 40,000 COLONIES/mL STAPHYLOCOCCUS SAPROPHYTICUS    [x]  Treated with cephalexin and fluconazole, organism resistant to prescribed antimicrobial []  Patient discharged originally without antimicrobial agent and treatment is now indicated  New antibiotic prescription: Septra DS 1 tab PO BID x 10 days  ED Provider: Providence Lanius, Walker Kehr T 11/21/2018, 1:26 PM Clinical Pharmacist [phone]

## 2018-11-21 NOTE — Telephone Encounter (Signed)
Post ED Visit - Positive Culture Follow-up: Successful Patient Follow-Up  Culture assessed and recommendations reviewed by:  []  Elenor Quinones, Pharm.D. []  Heide Guile, Pharm.D., BCPS AQ-ID []  Parks Neptune, Pharm.D., BCPS []  Alycia Rossetti, Pharm.D., BCPS []  [place], Pharm.D., BCPS, AAHIVP [x]  Leodis Sias, Pharm.D., BCPS, AAHIVP []  Salome Arnt, PharmD, BCPS []  Johnnette Gourd, PharmD, BCPS []  Hughes Better, PharmD, BCPS []  Leeroy Cha, PharmD  Positive urine culture  []  Patient discharged without antimicrobial prescription and treatment is now indicated [x]  Organism is resistant to prescribed ED discharge antimicrobial []  Patient with positive blood cultures  Changes discussed with ED provider: Providence Lanius PA New antibiotic prescription Septra DS one tab PO BID X ten days Called to [place] ( [address]) [phone]  Contacted patient, date 11/21/2018, time [place] 11/21/2018, 4:10 PM

## 2018-12-09 ENCOUNTER — Ambulatory Visit (INDEPENDENT_AMBULATORY_CARE_PROVIDER_SITE_OTHER): Payer: Self-pay | Admitting: Primary Care

## 2018-12-09 ENCOUNTER — Other Ambulatory Visit: Payer: Self-pay

## 2018-12-09 ENCOUNTER — Encounter (INDEPENDENT_AMBULATORY_CARE_PROVIDER_SITE_OTHER): Payer: Self-pay | Admitting: Primary Care

## 2018-12-09 DIAGNOSIS — Z308 Encounter for other contraceptive management: Secondary | ICD-10-CM

## 2018-12-09 DIAGNOSIS — Z7689 Persons encountering health services in other specified circumstances: Secondary | ICD-10-CM

## 2018-12-09 DIAGNOSIS — Z09 Encounter for follow-up examination after completed treatment for conditions other than malignant neoplasm: Secondary | ICD-10-CM

## 2018-12-09 DIAGNOSIS — N10 Acute tubulo-interstitial nephritis: Secondary | ICD-10-CM

## 2018-12-09 DIAGNOSIS — Z309 Encounter for contraceptive management, unspecified: Secondary | ICD-10-CM

## 2018-12-09 NOTE — Progress Notes (Signed)
Complete ATB.  Has a little bit of pain in back around kidneys Drinks a lot of water because she is very thirsty.

## 2018-12-21 NOTE — Progress Notes (Addendum)
Established Patient Office Visit  Subjective:  Patient ID: Terri Ramirez, female    DOB: February 02, 1995  Age: 23 y.o. MRN: [id number]  CC:  Chief Complaint  Patient presents with  . Follow-up    HPI Terri Ramirez is having a tele visit  for establishment of care and hospital follow up polynephritis presented to the emergency room on 12/06/2018 and for control management.  She denies any complaints or concerns at this time.  No past medical history on file.  No past surgical history on file.  No family history on file.  Social History   Socioeconomic History  . Marital status: Single    Spouse name: Not on file  . Number of children: Not on file  . Years of education: Not on file  . Highest education level: Not on file  Occupational History  . Not on file  Social Needs  . Financial resource strain: Not on file  . Food insecurity    Worry: Not on file    Inability: Not on file  . Transportation needs    Medical: Not on file    Non-medical: Not on file  Tobacco Use  . Smoking status: Never Smoker  . Smokeless tobacco: Never Used  Substance and Sexual Activity  . Alcohol use: Not on file  . Drug use: Yes    Types: Marijuana  . Sexual activity: Not on file  Lifestyle  . Physical activity    Days per week: Not on file    Minutes per session: Not on file  . Stress: Not on file  Relationships  . Social Musician on phone: Not on file    Gets together: Not on file    Attends religious service: Not on file    Active member of club or organization: Not on file    Attends meetings of clubs or organizations: Not on file    Relationship status: Not on file  . Intimate partner violence    Fear of current or ex partner: Not on file    Emotionally abused: Not on file    Physically abused: Not on file    Forced sexual activity: Not on file  Other Topics Concern  . Not on file  Social History Narrative  . Not on file    Outpatient Medications Prior to  Visit  Medication Sig Dispense Refill  . norgestimate-ethinyl estradiol (SPRINTEC 28) 0.25-35 MG-MCG tablet Take 1 tablet by mouth daily. 1 Package 11  . diazepam (VALIUM) 5 MG tablet Take 1 tablet (5 mg total) by mouth 2 (two) times daily. (Patient not taking: Reported on 12/09/2018) 10 tablet 0  . HYDROcodone-acetaminophen (NORCO/VICODIN) 5-325 MG tablet Take 1-2 tablets by mouth every 6 (six) hours as needed for severe pain. (Patient not taking: Reported on 12/09/2018) 10 tablet 0  . hydrOXYzine (ATARAX/VISTARIL) 25 MG tablet Take 1 tablet (25 mg total) by mouth every 6 (six) hours. (Patient not taking: Reported on 12/09/2018) 12 tablet 0  . ibuprofen (ADVIL,MOTRIN) 600 MG tablet Take 1 tablet (600 mg total) by mouth every 6 (six) hours as needed. (Patient not taking: Reported on 12/09/2018) 30 tablet 0  . ondansetron (ZOFRAN ODT) 4 MG disintegrating tablet Take 1 tablet (4 mg total) by mouth every 8 (eight) hours as needed for nausea or vomiting. (Patient not taking: Reported on 12/09/2018) 20 tablet 0  . phenazopyridine (PYRIDIUM) 200 MG tablet Take 1 tablet (200 mg total) by mouth 3 (three) times daily. (Patient not  taking: Reported on 12/09/2018) 6 tablet 0   No facility-administered medications prior to visit.     No Known Allergies  ROS Review of Systems  All other systems reviewed and are negative.     Objective:     LMP 11/30/2018 (Approximate)  Wt Readings from Last 3 Encounters:  No data found for [facility]     Health Maintenance Due  Topic Date Due  . HIV Screening  12/25/2011  . TETANUS/TDAP  12/25/2015  . PAP-Cervical Cytology Screening  12/24/2017  . PAP SMEAR-Modifier  12/24/2017  . INFLUENZA VACCINE  08/15/2018    There are no preventive care reminders to display for this patient.  No results found for: TSH Lab Results  Component Value Date   WBC 18.5 (H) 11/17/2018   HGB 12.4 11/17/2018   HCT 36.5 11/17/2018   MCV 91.7 11/17/2018   PLT 213 11/17/2018    Lab Results  Component Value Date   NA 134 (L) 11/17/2018   K 3.4 (L) 11/17/2018   CO2 21 (L) 11/17/2018   GLUCOSE 128 (H) 11/17/2018   BUN <5 (L) 11/17/2018   CREATININE 0.69 11/17/2018   BILITOT 1.6 (H) 11/17/2018   ALKPHOS 69 11/17/2018   AST 13 (L) 11/17/2018   ALT 16 11/17/2018   PROT 8.1 11/17/2018   ALBUMIN 4.0 11/17/2018   CALCIUM 9.0 11/17/2018   ANIONGAP 12 11/17/2018     Assessment & Plan:  Terri Ramirez was seen today for follow-up.  Diagnoses and all orders for this visit:  Encounter for contraceptive management, unspecified type On oral contraception Sprintec prescribed 08/2018 with 11 refills. Informed refills were available at the pharmacy. -     Cancel: POCT urine pregnancy -     POCT urine pregnancy; Future  Hospital discharge follow-up Treated in ED on 11/17/2018 for  pyelonephritis treated with antibiotics and  antifungal hx of yeast infection.   Encounter to establish care Terri Mire, NP-C will be your  (PCP) mastered prepared that is able to that will  diagnosed and treatment able to answer health concern as well as continuing care of varied medical conditions, not limited by cause, organ system, or diagnosis.   No orders of the defined types were placed in this encounter.   Follow-up: Return if symptoms worsen or fail to improve.    Terri Perna, NP

## 2019-04-22 ENCOUNTER — Ambulatory Visit: Payer: Self-pay | Attending: Internal Medicine

## 2019-04-22 DIAGNOSIS — Z23 Encounter for immunization: Secondary | ICD-10-CM

## 2019-04-22 NOTE — Progress Notes (Signed)
   Covid-19 Vaccination Clinic  Name:  Terri Ramirez    MRN: [id number] DOB: 12-02-1995  04/22/2019  [name] was observed post Covid-19 immunization for 15 minutes without incident. She was provided with Vaccine Information Sheet and instruction to access the V-Safe system.   [name] was instructed to call 911 with any severe reactions post vaccine: Marland Kitchen Difficulty breathing  . Swelling of face and throat  . A fast heartbeat  . A bad rash all over body  . Dizziness and weakness   Immunizations Administered    Name Date Dose VIS Date Route   Pfizer COVID-19 Vaccine 04/22/2019 12:16 PM 0.3 mL 12/25/2018 Intramuscular   Manufacturer: [employer], [employer]   Lot: [id number]   NDC: [address]-8

## 2019-05-17 ENCOUNTER — Ambulatory Visit: Payer: Self-pay | Attending: Internal Medicine

## 2019-05-17 DIAGNOSIS — Z23 Encounter for immunization: Secondary | ICD-10-CM

## 2019-05-17 NOTE — Progress Notes (Signed)
   Covid-19 Vaccination Clinic  Name:  Terri Ramirez    MRN: [id number] DOB: 1995/10/25  05/17/2019  [name] was observed post Covid-19 immunization for 15 minutes without incident. She was provided with Vaccine Information Sheet and instruction to access the V-Safe system.   [name] was instructed to call 911 with any severe reactions post vaccine: Marland Kitchen Difficulty breathing  . Swelling of face and throat  . A fast heartbeat  . A bad rash all over body  . Dizziness and weakness   Immunizations Administered    Name Date Dose VIS Date Route   Pfizer COVID-19 Vaccine 05/17/2019  9:52 AM 0.3 mL 03/10/2018 Intramuscular   Manufacturer: [employer], [employer]   Lot: [id number]   NDC: [address]-7

## 2019-10-30 ENCOUNTER — Emergency Department (HOSPITAL_COMMUNITY)
Admission: EM | Admit: 2019-10-30 | Discharge: 2019-10-30 | Disposition: A | Discharge status: Home / Self Care | Payer: Self-pay | Attending: Emergency Medicine | Admitting: Emergency Medicine

## 2019-10-30 ENCOUNTER — Other Ambulatory Visit: Payer: Self-pay

## 2019-10-30 ENCOUNTER — Encounter (HOSPITAL_COMMUNITY): Payer: Self-pay

## 2019-10-30 ENCOUNTER — Emergency Department (HOSPITAL_COMMUNITY): Payer: Self-pay

## 2019-10-30 DIAGNOSIS — Y9269 Other specified industrial and construction area as the place of occurrence of the external cause: Secondary | ICD-10-CM | POA: Insufficient documentation

## 2019-10-30 DIAGNOSIS — X500XXA Overexertion from strenuous movement or load, initial encounter: Secondary | ICD-10-CM | POA: Insufficient documentation

## 2019-10-30 DIAGNOSIS — T148XXA Other injury of unspecified body region, initial encounter: Secondary | ICD-10-CM

## 2019-10-30 DIAGNOSIS — S39011A Strain of muscle, fascia and tendon of abdomen, initial encounter: Secondary | ICD-10-CM | POA: Insufficient documentation

## 2019-10-30 LAB — URINALYSIS, ROUTINE W REFLEX MICROSCOPIC
Bilirubin Urine: NEGATIVE
Glucose, UA: NEGATIVE mg/dL
Hgb urine dipstick: NEGATIVE
Ketones, ur: NEGATIVE mg/dL
Leukocytes,Ua: NEGATIVE
Nitrite: NEGATIVE
Protein, ur: NEGATIVE mg/dL
Specific Gravity, Urine: 1.011 (ref 1.005–1.030)
pH: 7 (ref 5.0–8.0)

## 2019-10-30 LAB — CBC WITH DIFFERENTIAL/PLATELET
Abs Immature Granulocytes: 0.03 10*3/uL (ref 0.00–0.07)
Basophils Absolute: 0.1 10*3/uL (ref 0.0–0.1)
Basophils Relative: 1 %
Eosinophils Absolute: 0.1 10*3/uL (ref 0.0–0.5)
Eosinophils Relative: 2 %
HCT: 37.4 % (ref 36.0–46.0)
Hemoglobin: 13 g/dL (ref 12.0–15.0)
Immature Granulocytes: 0 %
Lymphocytes Relative: 29 %
Lymphs Abs: 2.2 10*3/uL (ref 0.7–4.0)
MCH: 31.9 pg (ref 26.0–34.0)
MCHC: 34.8 g/dL (ref 30.0–36.0)
MCV: 91.7 fL (ref 80.0–100.0)
Monocytes Absolute: 0.6 10*3/uL (ref 0.1–1.0)
Monocytes Relative: 9 %
Neutro Abs: 4.5 10*3/uL (ref 1.7–7.7)
Neutrophils Relative %: 59 %
Platelets: 216 10*3/uL (ref 150–400)
RBC: 4.08 MIL/uL (ref 3.87–5.11)
RDW: 12.1 % (ref 11.5–15.5)
WBC: 7.5 10*3/uL (ref 4.0–10.5)
nRBC: 0 % (ref 0.0–0.2)

## 2019-10-30 LAB — COMPREHENSIVE METABOLIC PANEL
ALT: 19 U/L (ref 0–44)
AST: 20 U/L (ref 15–41)
Albumin: 4 g/dL (ref 3.5–5.0)
Alkaline Phosphatase: 53 U/L (ref 38–126)
Anion gap: 9 (ref 5–15)
BUN: 7 mg/dL (ref 6–20)
CO2: 23 mmol/L (ref 22–32)
Calcium: 8.9 mg/dL (ref 8.9–10.3)
Chloride: 104 mmol/L (ref 98–111)
Creatinine, Ser: 0.46 mg/dL (ref 0.44–1.00)
GFR, Estimated: >60 mL/min (ref >60)
Glucose, Bld: 106 mg/dL — ABNORMAL HIGH (ref 70–99)
Potassium: 3.6 mmol/L (ref 3.5–5.1)
Sodium: 136 mmol/L (ref 135–145)
Total Bilirubin: 1.1 mg/dL (ref 0.3–1.2)
Total Protein: 7.3 g/dL (ref 6.5–8.1)

## 2019-10-30 LAB — I-STAT BETA HCG BLOOD, ED (MC, WL, AP ONLY): I-stat hCG, quantitative: <5 m[IU]/mL (ref <5)

## 2019-10-30 LAB — LIPASE, BLOOD: Lipase: 25 U/L (ref 11–51)

## 2019-10-30 NOTE — Discharge Instructions (Addendum)
Your work-up today was reassuring.  I think you most likely have muscle strain as we spoke about, I want you to use NSAIDs such as ibuprofen or naproxen for the next 10 days as prescribed on the bottle.  Over the next 10 days I want you to lift less than 5 pounds.  If you have any new or worsening concerning symptoms please come back to the emergency department.  Please have this rechecked in the next couple of days by primary care, you can schedule an appointment with [facility] health community health and wellness.

## 2019-10-30 NOTE — ED Triage Notes (Signed)
Patient c/o intermittent right flank pain x 6 days and states she has a bulging area on the RUQ for a few months, but worse now.  Patient denies any urinary frequency or dysuria.

## 2019-10-30 NOTE — ED Provider Notes (Signed)
[facility]-EMERGENCY DEPT Provider Note   CSN: [id number] Arrival date & time: 10/30/19  0818     History Chief Complaint  Patient presents with  . Flank Pain    Terri Ramirez is a 24 y.o. female with no pertinent past medical history that presents the emergency department today for right-sided flank pain for the past 6 days.  Patient states that pain is intermittent, can last up to a day or 15 minutes, does not radiate anywhere.  Patient states that when pain comes on it feels like a cramping sensation is severe.  States that she does feel nauseous with this.  Currently not having any pain or nausea.  Denies any falls to that area.  Denies any previous history of kidney stones.  Denies any dysuria, hematuria, vaginal pain, vaginal discharge, vaginal bleeding.  Denies any chance of STDs or pregnancy.  Denies any fevers, chills.  Normal appetite.  No diarrhea, constipation, melena, bright red blood per rectum.  Denies any previous abdominal surgeries.  Patient has not tried anything for this.  Also states that she feels a moving sensation in her upper abdomen for the past couple of months when she distends her stomach.  Denies any cough, URI symptoms.  Denies any sick contacts.  No alcohol or IV drug use.  Has been vaccinated against Covid.  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  HPI     History reviewed. No pertinent past medical history.  There are no problems to display for this patient.   History reviewed. No pertinent surgical history.   OB History   No obstetric history on file.     Family History  Adopted: Yes    Social History   Tobacco Use  . Smoking status: Never Smoker  . Smokeless tobacco: Never Used  Vaping Use  . Vaping Use: Never used  Substance Use Topics  . Alcohol use: Never  . Drug use: Yes    Types: Marijuana    Home Medications Prior to Admission medications     Medication Sig Start Date End Date Taking? Authorizing Provider  norgestimate-ethinyl estradiol (SPRINTEC 28) 0.25-35 MG-MCG tablet Take 1 tablet by mouth daily. 08/26/18  Yes McDonald, Mia A, PA-C  diazepam (VALIUM) 5 MG tablet Take 1 tablet (5 mg total) by mouth 2 (two) times daily. Patient not taking: Reported on 12/09/2018 02/21/18   Jacalyn Lefevre, MD  HYDROcodone-acetaminophen (NORCO/VICODIN) 5-325 MG tablet Take 1-2 tablets by mouth every 6 (six) hours as needed for severe pain. Patient not taking: Reported on 12/09/2018 11/17/18   Harolyn Rutherford C, PA-C  hydrOXYzine (ATARAX/VISTARIL) 25 MG tablet Take 1 tablet (25 mg total) by mouth every 6 (six) hours. Patient not taking: Reported on 12/09/2018 08/26/18   McDonald, Mia A, PA-C  ibuprofen (ADVIL,MOTRIN) 600 MG tablet Take 1 tablet (600 mg total) by mouth every 6 (six) hours as needed. Patient not taking: Reported on 12/09/2018 02/21/18   Jacalyn Lefevre, MD  ondansetron (ZOFRAN ODT) 4 MG disintegrating tablet Take 1 tablet (4 mg total) by mouth every 8 (eight) hours as needed for nausea or vomiting. Patient not taking: Reported on 12/09/2018 11/17/18   Anselm Pancoast, PA-C  phenazopyridine (PYRIDIUM) 200 MG tablet Take 1 tablet (200 mg total) by mouth 3 (three) times daily. Patient not taking: Reported on 12/09/2018 11/17/18   Anselm Pancoast, PA-C    Allergies    Patient has no known  allergies.  Review of Systems   Review of Systems  Constitutional: Negative for chills, diaphoresis, fatigue and fever.  HENT: Negative for congestion, sore throat and trouble swallowing.   Eyes: Negative for pain and visual disturbance.  Respiratory: Negative for cough, shortness of breath and wheezing.   Cardiovascular: Negative for chest pain, palpitations and leg swelling.  Gastrointestinal: Positive for nausea. Negative for abdominal distention, abdominal pain, diarrhea and vomiting.  Genitourinary: Positive for flank pain. Negative for decreased urine volume,  difficulty urinating, dysuria, frequency, genital sores, hematuria, menstrual problem, urgency, vaginal bleeding, vaginal discharge and vaginal pain.  Musculoskeletal: Negative for back pain, neck pain and neck stiffness.  Skin: Negative for pallor.  Neurological: Negative for dizziness, speech difficulty, weakness and headaches.  Psychiatric/Behavioral: Negative for confusion.    Physical Exam Updated Vital Signs BP 119/79   Pulse 68   Temp 98.4 F (36.9 C) (Oral)   Resp 20   Ht 5\' 6"  (1.676 m)   Wt 59 kg   LMP 09/30/2019 (Approximate)   SpO2 100%   BMI 20.98 kg/m   Physical Exam Constitutional:      General: She is not in acute distress.    Appearance: Normal appearance. She is not ill-appearing, toxic-appearing or diaphoretic.  HENT:     Mouth/Throat:     Mouth: Mucous membranes are moist.     Pharynx: Oropharynx is clear.  Eyes:     General: No scleral icterus.    Extraocular Movements: Extraocular movements intact.     Pupils: Pupils are equal, round, and reactive to light.  Cardiovascular:     Rate and Rhythm: Normal rate and regular rhythm.     Pulses: Normal pulses.     Heart sounds: Normal heart sounds.  Pulmonary:     Effort: Pulmonary effort is normal. No respiratory distress.     Breath sounds: Normal breath sounds. No stridor. No wheezing, rhonchi or rales.  Chest:     Chest wall: No tenderness.  Abdominal:     General: Abdomen is flat. Bowel sounds are normal. There is no distension.     Palpations: Abdomen is soft.     Tenderness: There is no abdominal tenderness. There is no right CVA tenderness, left CVA tenderness, guarding or rebound. Negative signs include Murphy's sign, Rovsing's sign and McBurney's sign.     Hernia: No hernia is present. There is no hernia in the umbilical area.       Comments: Patient with tenderness to area depicted above on right flank, no true abdominal tenderness.  No ecchymosis noted.  No guarding noted.  No rib pain.    Musculoskeletal:        General: No swelling or tenderness. Normal range of motion.     Cervical back: Normal range of motion and neck supple. No rigidity.     Right lower leg: No edema.     Left lower leg: No edema.  Skin:    General: Skin is warm and dry.     Capillary Refill: Capillary refill takes less than 2 seconds.     Coloration: Skin is not pale.  Neurological:     General: No focal deficit present.     Mental Status: She is alert and oriented to person, place, and time.  Psychiatric:        Mood and Affect: Mood normal.        Behavior: Behavior normal.     ED Results / Procedures / Treatments   Labs (all labs  ordered are listed, but only abnormal results are displayed) Labs Reviewed  COMPREHENSIVE METABOLIC PANEL - Abnormal; Notable for the following components:      Result Value   Glucose, Bld 106 (*)    All other components within normal limits  URINALYSIS, ROUTINE W REFLEX MICROSCOPIC  CBC WITH DIFFERENTIAL/PLATELET  LIPASE, BLOOD  I-STAT BETA HCG BLOOD, ED ([facility], [facility], [facility] ONLY)    EKG None  Radiology CT Renal Stone Study  Result Date: 10/30/2019 CLINICAL DATA:  24 year old female with flank pain for 6 days, bulging area in the right upper quadrant. EXAM: CT ABDOMEN AND PELVIS WITHOUT CONTRAST TECHNIQUE: Multidetector CT imaging of the abdomen and pelvis was performed following the standard protocol without IV contrast. COMPARISON:  None. FINDINGS: Lower chest: No acute abnormality. Hepatobiliary: No focal liver abnormality is seen. No gallstones, gallbladder wall thickening, or biliary dilatation. Pancreas: Unremarkable. No pancreatic ductal dilatation or surrounding inflammatory changes. Spleen: Normal in size without focal abnormality. Adrenals/Urinary Tract: Adrenal glands are unremarkable. Kidneys are normal, without renal calculi, focal lesion, or hydronephrosis. Bladder is unremarkable. Stomach/Bowel: Stomach is within normal limits. Appendix appears normal.  No evidence of bowel wall thickening, distention, or inflammatory changes. Vascular/Lymphatic: No significant vascular findings are present. No enlarged abdominal or pelvic lymph nodes. Reproductive: Uterus and bilateral adnexa are unremarkable. Other: No abdominal wall hernia or abnormality. No abdominopelvic ascites. Musculoskeletal: No acute or significant osseous findings. IMPRESSION: No acute abdominopelvic abnormality. Specifically, no evidence nephrolithiasis as queried. Electronically Signed   By: Marliss Coots MD   On: 10/30/2019 11:52    Procedures Procedures (including critical care time)  Medications Ordered in ED Medications - No data to display  ED Course  I have reviewed the triage vital signs and the nursing notes.  Pertinent labs & imaging results that were available during my care of the patient were reviewed by me and considered in my medical decision making (see chart for details).    MDM Rules/Calculators/A&P                         Terri Ramirez is a 24 y.o. female with no pertinent past medical history that presents the emergency department today for right-sided flank pain for the past 6 days. Differential diagnoses considered include nephrolithiasis, renal colic, muscle strain.  Does not appear to be rib pain or lung pathology, no respiratory symptoms and patient is not having any rib pain.  Initial interventions none, patient not currently nauseous or in pain.  Labs demonstrated unremarkable CBC, CMP, lipase, urinalysis. CT renal negative, no acute abdominal pathology. Upon reassessment patient still has not had any pain while being here for 4 hours, still does not feel nauseous.  Given the above findings, my suspicion is that patient most likely has muscle strain from heavy lifting, did discuss this in depth with patient including symptomatic treatment and PCP follow-up.  Patient agreeable.In regard to patient feeling things moving in her abdomen, this is most  likely peristalsis from stool moving through her colon, did discuss this with patient.  Doubt need for further emergent work up at this time. I explained the diagnosis and have given explicit precautions to return to the ER including for any other new or worsening symptoms. The patient understands and accepts the medical plan as it's been dictated and I have answered their questions. Discharge instructions concerning home care and prescriptions have been given. The patient is STABLE and is discharged to home in good condition.  Final Clinical Impression(s) / ED Diagnoses Final diagnoses:  Muscle strain    Rx / DC Orders ED Discharge Orders    None       Farrel Gordon, PA-C 10/30/19 1255    Gwyneth Sprout, MD 10/30/19 1932

## 2020-01-21 ENCOUNTER — Other Ambulatory Visit: Payer: Self-pay

## 2020-01-21 DIAGNOSIS — Z20822 Contact with and (suspected) exposure to covid-19: Secondary | ICD-10-CM

## 2020-01-24 LAB — NOVEL CORONAVIRUS, NAA: SARS-CoV-2, NAA: NOT DETECTED

## 2020-01-31 ENCOUNTER — Other Ambulatory Visit: Payer: Self-pay

## 2020-05-01 ENCOUNTER — Other Ambulatory Visit: Payer: Self-pay

## 2020-05-01 ENCOUNTER — Emergency Department (HOSPITAL_COMMUNITY)
Admission: EM | Admit: 2020-05-01 | Discharge: 2020-05-01 | Disposition: A | Discharge status: Home / Self Care | Payer: Self-pay | Attending: Physician Assistant | Admitting: Physician Assistant

## 2020-05-01 ENCOUNTER — Encounter (HOSPITAL_COMMUNITY): Payer: Self-pay | Admitting: *Deleted

## 2020-05-01 DIAGNOSIS — F419 Anxiety disorder, unspecified: Secondary | ICD-10-CM | POA: Insufficient documentation

## 2020-05-01 DIAGNOSIS — Z5321 Procedure and treatment not carried out due to patient leaving prior to being seen by health care provider: Secondary | ICD-10-CM | POA: Insufficient documentation

## 2020-05-01 DIAGNOSIS — R002 Palpitations: Secondary | ICD-10-CM | POA: Insufficient documentation

## 2020-05-01 DIAGNOSIS — Z760 Encounter for issue of repeat prescription: Secondary | ICD-10-CM | POA: Insufficient documentation

## 2020-05-01 NOTE — ED Triage Notes (Addendum)
Pt reports having anxiety and palpitations. Hx of same but is out of her medication. Denies SI or HI.

## 2020-05-01 NOTE — ED Notes (Signed)
Called Pt to be roomed no answer. 

## 2020-05-01 NOTE — ED Notes (Signed)
Called Pt for vitals no answer. 

## 2020-05-01 NOTE — ED Notes (Signed)
Pt called 2x times no answer

## 2020-05-01 NOTE — ED Triage Notes (Signed)
Emergency Medicine Provider Triage Evaluation Note  Terri Ramirez , a 25 y.o. female  was evaluated in triage.  Pt complains of anxiety, palpitations, panic attacks. She is requesting a med refill. Was previously on hydroxyzine which was helpful but she ran out of the prescription.   Review of Systems  Positive: anxiety Negative: Chest pain   Physical Exam  BP 136/82 (BP Location: Right Arm)   Pulse 78   Temp 97.9 F (36.6 C) (Oral)   Resp 17   SpO2 100%  Gen:   Awake, no distress   HEENT:  Atraumatic  Resp:  Normal effort  Cardiac:  Normal rate  Abd:   Nondistended, nontender  MSK:   Moves extremities without difficulty  Neuro:  Speech clear   Medical Decision Making  Medically screening exam initiated at 5:59 PM.  Appropriate orders placed.  Terri Ramirez was informed that the remainder of the evaluation will be completed by another provider, this initial triage assessment does not replace that evaluation, and the importance of remaining in the ED until their evaluation is complete.  Clinical Impression   Requesting med refill for hydroxyzine.  MSE was initiated and I personally evaluated the patient and placed orders (if any) at  5:59 PM on May 01, 2020.  The patient appears stable so that the remainder of the MSE may be completed by another provider.    Karrie Meres, [place] 05/01/20 1835

## 2020-05-11 ENCOUNTER — Emergency Department (HOSPITAL_COMMUNITY)
Admission: EM | Admit: 2020-05-11 | Discharge: 2020-05-11 | Disposition: A | Discharge status: Home / Self Care | Payer: Self-pay | Attending: Emergency Medicine | Admitting: Emergency Medicine

## 2020-05-11 ENCOUNTER — Encounter (HOSPITAL_COMMUNITY): Payer: Self-pay | Admitting: Emergency Medicine

## 2020-05-11 ENCOUNTER — Emergency Department (HOSPITAL_COMMUNITY): Payer: Self-pay

## 2020-05-11 ENCOUNTER — Other Ambulatory Visit: Payer: Self-pay

## 2020-05-11 DIAGNOSIS — M545 Low back pain, unspecified: Secondary | ICD-10-CM | POA: Insufficient documentation

## 2020-05-11 DIAGNOSIS — R109 Unspecified abdominal pain: Secondary | ICD-10-CM | POA: Insufficient documentation

## 2020-05-11 LAB — CBC WITH DIFFERENTIAL/PLATELET
Abs Immature Granulocytes: 0.03 K/uL (ref 0.00–0.07)
Basophils Absolute: 0 K/uL (ref 0.0–0.1)
Basophils Relative: 1 %
Eosinophils Absolute: 0.1 K/uL (ref 0.0–0.5)
Eosinophils Relative: 1 %
HCT: 40.8 % (ref 36.0–46.0)
Hemoglobin: 13.9 g/dL (ref 12.0–15.0)
Immature Granulocytes: 0 %
Lymphocytes Relative: 27 %
Lymphs Abs: 1.9 K/uL (ref 0.7–4.0)
MCH: 31.9 pg (ref 26.0–34.0)
MCHC: 34.1 g/dL (ref 30.0–36.0)
MCV: 93.6 fL (ref 80.0–100.0)
Monocytes Absolute: 0.5 K/uL (ref 0.1–1.0)
Monocytes Relative: 8 %
Neutro Abs: 4.4 K/uL (ref 1.7–7.7)
Neutrophils Relative %: 63 %
Platelets: 213 K/uL (ref 150–400)
RBC: 4.36 MIL/uL (ref 3.87–5.11)
RDW: 12.4 % (ref 11.5–15.5)
WBC: 7 K/uL (ref 4.0–10.5)
nRBC: 0 % (ref 0.0–0.2)

## 2020-05-11 LAB — COMPREHENSIVE METABOLIC PANEL
ALT: 22 U/L (ref 0–44)
AST: 22 U/L (ref 15–41)
Albumin: 4.2 g/dL (ref 3.5–5.0)
Alkaline Phosphatase: 57 U/L (ref 38–126)
Anion gap: 8 (ref 5–15)
BUN: 10 mg/dL (ref 6–20)
CO2: 23 mmol/L (ref 22–32)
Calcium: 9.2 mg/dL (ref 8.9–10.3)
Chloride: 103 mmol/L (ref 98–111)
Creatinine, Ser: 0.54 mg/dL (ref 0.44–1.00)
GFR, Estimated: >60 mL/min (ref >60)
Glucose, Bld: 167 mg/dL — ABNORMAL HIGH (ref 70–99)
Potassium: 4.4 mmol/L (ref 3.5–5.1)
Sodium: 134 mmol/L — ABNORMAL LOW (ref 135–145)
Total Bilirubin: 0.7 mg/dL (ref 0.3–1.2)
Total Protein: 7.5 g/dL (ref 6.5–8.1)

## 2020-05-11 LAB — LIPASE, BLOOD: Lipase: 30 U/L (ref 11–51)

## 2020-05-11 LAB — URINALYSIS, ROUTINE W REFLEX MICROSCOPIC
Bilirubin Urine: NEGATIVE
Glucose, UA: NEGATIVE mg/dL
Hgb urine dipstick: NEGATIVE
Ketones, ur: NEGATIVE mg/dL
Leukocytes,Ua: NEGATIVE
Nitrite: NEGATIVE
Protein, ur: NEGATIVE mg/dL
Specific Gravity, Urine: 1.013 (ref 1.005–1.030)
pH: 8 (ref 5.0–8.0)

## 2020-05-11 LAB — PREGNANCY, URINE: Preg Test, Ur: NEGATIVE

## 2020-05-11 MED ORDER — HYDROXYZINE HCL 25 MG PO TABS: 25.0000 mg | ORAL_TABLET | Freq: Four times a day (QID) | ORAL | 1 refills | Status: DC | PRN

## 2020-05-11 NOTE — ED Provider Notes (Signed)
[facility]-EMERGENCY DEPT Provider Note   CSN: [id number] Arrival date & time: 05/11/20  1013     History Chief Complaint  Patient presents with  . Flank Pain    Terri Ramirez is a 25 y.o. female.  HPI      Terri Ramirez is a 25 y.o. female, presenting to the ED primarily complaining of left lower back and left flank pain for the last 10 days. Described as an aching, moderate to severe, constant, nonradiating. She also mentions she was previously prescribed medication for anxiety, but has since run out and would like this refilled.  Denies persistent fever, vomiting, dysuria, difficulty urinating, hematuria, acute abdominal pain, diarrhea, constipation, abnormal vaginal discharge/bleeding, or any other complaints.  History reviewed. No pertinent past medical history.  There are no problems to display for this patient.   History reviewed. No pertinent surgical history.   OB History   No obstetric history on file.     Family History  Adopted: Yes    Social History   Tobacco Use  . Smoking status: Never Smoker  . Smokeless tobacco: Never Used  Vaping Use  . Vaping Use: Never used  Substance Use Topics  . Alcohol use: Never  . Drug use: Yes    Types: Marijuana    Home Medications Prior to Admission medications   Medication Sig Start Date End Date Taking? Authorizing Provider  hydrOXYzine (ATARAX/VISTARIL) 25 MG tablet Take 1 tablet (25 mg total) by mouth every 6 (six) hours as needed for anxiety. 05/11/20  Yes Ezriel Boffa C, PA-C  ibuprofen (ADVIL) 200 MG tablet Take 200 mg by mouth every 6 (six) hours as needed for mild pain.   Yes [provider]  HYDROcodone-acetaminophen (NORCO/VICODIN) 5-325 MG tablet Take 1-2 tablets by mouth every 6 (six) hours as needed for severe pain. Patient not taking: No sig reported 11/17/18   Genevieve Ritzel C, PA-C  ibuprofen (ADVIL,MOTRIN) 600 MG tablet Take 1 tablet (600 mg total) by mouth every 6  (six) hours as needed. Patient not taking: No sig reported 02/21/18   Jacalyn LefevreHaviland, Julie, MD  norgestimate-ethinyl estradiol (SPRINTEC 28) 0.25-35 MG-MCG tablet Take 1 tablet by mouth daily. Patient not taking: Reported on 05/11/2020 08/26/18   McDonald, Mia A, PA-C  ondansetron (ZOFRAN ODT) 4 MG disintegrating tablet Take 1 tablet (4 mg total) by mouth every 8 (eight) hours as needed for nausea or vomiting. Patient not taking: No sig reported 11/17/18   Dejon Lukas C, PA-C  phenazopyridine (PYRIDIUM) 200 MG tablet Take 1 tablet (200 mg total) by mouth 3 (three) times daily. Patient not taking: No sig reported 11/17/18   Harolyn RutherfordJoy, Kariya Lavergne C, PA-C    Allergies    Patient has no known allergies.  Review of Systems   Review of Systems  Constitutional: Negative for chills.  Respiratory: Negative for shortness of breath.   Cardiovascular: Negative for chest pain.  Gastrointestinal: Positive for abdominal pain. Negative for blood in stool, constipation, diarrhea, nausea and vomiting.  Genitourinary: Positive for flank pain. Negative for difficulty urinating, dysuria, frequency, hematuria, vaginal bleeding and vaginal discharge.  Musculoskeletal: Positive for back pain.  Neurological: Negative for weakness and numbness.  All other systems reviewed and are negative.   Physical Exam Updated Vital Signs BP (!) 144/100 (BP Location: Right Arm)   Pulse 81   Temp 98.5 F (36.9 C) (Oral)   Resp 18   Ht 5\' 4"  (1.626 m)   Wt 59 kg   LMP 04/10/2020  SpO2 99%   BMI 22.31 kg/m   Physical Exam Vitals and nursing note reviewed.  Constitutional:      General: She is not in acute distress.    Appearance: She is well-developed. She is not diaphoretic.  HENT:     Head: Normocephalic and atraumatic.     Mouth/Throat:     Mouth: Mucous membranes are moist.     Pharynx: Oropharynx is clear.  Eyes:     Conjunctiva/sclera: Conjunctivae normal.  Cardiovascular:     Rate and Rhythm: Normal rate and regular  rhythm.     Pulses: Normal pulses.  Pulmonary:     Effort: Pulmonary effort is normal. No respiratory distress.  Abdominal:     Palpations: Abdomen is soft.     Tenderness: There is no abdominal tenderness. There is no right CVA tenderness, left CVA tenderness or guarding.  Musculoskeletal:     Cervical back: Neck supple.  Skin:    General: Skin is warm and dry.  Neurological:     Mental Status: She is alert.  Psychiatric:        Mood and Affect: Mood and affect normal.        Speech: Speech normal.        Behavior: Behavior normal.     ED Results / Procedures / Treatments   Labs (all labs ordered are listed, but only abnormal results are displayed) Labs Reviewed  URINALYSIS, ROUTINE W REFLEX MICROSCOPIC - Abnormal; Notable for the following components:      Result Value   Color, Urine STRAW (*)    All other components within normal limits  COMPREHENSIVE METABOLIC PANEL - Abnormal; Notable for the following components:   Sodium 134 (*)    Glucose, Bld 167 (*)    All other components within normal limits  CBC WITH DIFFERENTIAL/PLATELET  LIPASE, BLOOD  PREGNANCY, URINE    EKG None  Radiology CT Renal Stone Study  Result Date: 05/11/2020 CLINICAL DATA:  Flank pain, suspected kidney stone in a 25 year old female. EXAM: CT ABDOMEN AND PELVIS WITHOUT CONTRAST TECHNIQUE: Multidetector CT imaging of the abdomen and pelvis was performed following the standard protocol without IV contrast. COMPARISON:  October 30, 2019 FINDINGS: Lower chest: Incidental imaging of the lung bases without effusion or sign of consolidation. Hepatobiliary: Liver with normal contour. No pericholecystic stranding. Pancreas: Pancreas with normal contour, no signs of adjacent inflammation. Spleen: Normal size and contour. Adrenals/Urinary Tract: Adrenal glands are normal. Smooth contour of the bilateral kidneys. No hydronephrosis. No calcification along the course of the ureters. No perivesical stranding. No  nephrolithiasis. Stomach/Bowel: Colon with normal caliber. No pericolonic stranding. Mildly dilated proximal small bowel loops terminating at the level of suspected short-segment intussusception of the small bowel in the central abdomen. Best seen on image 36 of series 2 in the distal jejunum or proximal ileum. Appendix not visualized, by report surgically absent. Vascular/Lymphatic: Normal caliber abdominal aorta. Smooth contour of the IVC. There is no gastrohepatic or hepatoduodenal ligament lymphadenopathy. No retroperitoneal or mesenteric lymphadenopathy. No pelvic sidewall lymphadenopathy. Reproductive: Small amount of dense material in the vagina mixed with a small amount of gas. This is of uncertain significance. Reproductive structures are otherwise unremarkable. Other: No ascites.  No free air. Musculoskeletal: No acute bone finding or destructive bone process. IMPRESSION: 1. Mildly dilated proximal small bowel loops terminating at the level of suspected short-segment intussusception of the small bowel in the central abdomen. These are often transient. The presence of symptoms and mild proximal bowel  distension may warrant further evaluation. Could consider CT enterography as warranted for further assessment. 2. Small amount of dense material in the vagina mixed with a small amount of gas. This is of uncertain significance. Correlate with any signs of bleeding or discharge. Density higher than expected for blood though at 100 Hounsfield units. No contrast was administered either in the GI tract or intravenously on this exam, significance uncertain. 3. No evidence of nephrolithiasis or hydronephrosis. Urinary bladder is moderately dilated. Correlate with any signs of urinary retention. No perivesical stranding. 4. No nephrolithiasis. Electronically Signed   By: Donzetta Kohut M.D.   On: 05/11/2020 14:08    Procedures Procedures   Medications Ordered in ED Medications - No data to display  ED Course   I have reviewed the triage vital signs and the nursing notes.  Pertinent labs & imaging results that were available during my care of the patient were reviewed by me and considered in my medical decision making (see chart for details).  Clinical Course as of 05/11/20 1552  Thu May 11, 2020  1505 Spoke with Dr. Nadene Rubins, radiologist who read the patient's CT study. We discussed the mention findings of possible short segment intussusception.  He reiterates that this type of finding is typically transient and he would not normally mention it other than the fact that the patient did not have a renal stone on this study and he wanted to make sure everything was mentioned in case it was clinically relevant. He states if the patient has acute symptoms in the upper abdomen, such as pain, vomiting, fever, inability to have bowel movements, then he would recommend obtaining CT of the abdomen with IV and oral contrast.  Put in the comments "enterography protocol to evaluate for small bowel abnormality." If she does not have acute symptoms, she will still need to be evaluated, but this can typically be done by gastroenterology as an outpatient. [SJ]    Clinical Course User Index [SJ] Florance Paolillo, Hillard Danker, PA-C   MDM Rules/Calculators/A&P                           Patient presents primarily complaining of left lower back and left flank pain. Patient is nontoxic appearing, afebrile, not tachycardic, not tachypneic, not hypotensive, maintains excellent SPO2 on room air, and is in no apparent distress.   I have reviewed the patient's chart to obtain more information.   I reviewed and interpreted the patient's labs and radiological studies.  After the CT read returned, I discussed findings with the patient.  I had to take more in-depth about any epigastric symptoms as this was not originally a primary concern for her.  She states she has had bloating and upper abdominal discomfort after eating for the past 2 years.   This has not increased or changed. She is not vomiting, she is tolerating p.o. here in the ED, she has no acute abdominal complaints.  I discussed with her that for further evaluation we would need to obtain additional CT scan.  She prefers not to do this in the ED.  She will follow-up with GI on this matter. The patient was given instructions for home care as well as strict return precautions. Patient voices understanding of these instructions, accepts the plan, and is comfortable with discharge.   Vitals:   05/11/20 1300 05/11/20 1407 05/11/20 1430 05/11/20 1500  BP: (!) 130/97 129/88 129/82 (!) 133/95  Pulse: (!) 59 (!) 57 Marland Kitchen)  57 67  Resp: 16 18  18   Temp:      TempSrc:      SpO2: 100% 100% 100% 100%  Weight:      Height:          Final Clinical Impression(s) / ED Diagnoses Final diagnoses:  Flank pain    Rx / DC Orders ED Discharge Orders         Ordered    hydrOXYzine (ATARAX/VISTARIL) 25 MG tablet  Every 6 hours PRN        05/11/20 1510           Kahlil Cowans, 05/13/20, PA-C 05/11/20 1600    [place], DO 05/12/20 0710

## 2020-05-11 NOTE — Discharge Instructions (Addendum)
  Hydroxyzine: May use the hydroxyzine, as needed, for anxiety.  Use caution as hydroxyzine can make you drowsy.  We recommend follow-up with gastroenterology as you may need further evaluation of a possible abnormality noted on CT scan.  Return to the emergency department should you have worsening abdominal or epigastric pain, persistent vomiting, inability to have bowel movements with the symptoms, fever with the symptoms, or any other major concerns.

## 2020-05-11 NOTE — ED Notes (Signed)
Signature pad not working in room, patient attempted to sign. Verbalized consent for MSE.

## 2020-05-11 NOTE — ED Triage Notes (Signed)
Patient endorsers L flank pain x1.5 weeks, called it 'kidney pain.' Denies changes w/ urination or pain w/ urination, no blood, changes in color or odor. Endorses nausea, no emesis. Requesting anxiety medication as well.

## 2020-05-11 NOTE — ED Triage Notes (Signed)
Emergency Medicine Provider Triage Evaluation Note  Terri Ramirez , a 25 y.o. female  was evaluated in triage.  Pt complains of left flank and lower back pain for the past 10 days.  Described as an aching, moderate to severe, nonradiating. She also endorses occasional epigastric pain and bloating. She states she would like to have a refill for her anxiety medication as well.  Review of Systems  Positive: Primarily, left flank and lower back pain, nausea, fever yesterday Negative: Dysuria, hematuria, persistent abdominal pain, vomiting, diarrhea, abnormal vaginal discharge/bleeding  Physical Exam  BP (!) 144/100 (BP Location: Right Arm)   Pulse 81   Temp 98.5 F (36.9 C) (Oral)   Resp 18   Ht 5\' 4"  (1.626 m)   Wt 59 kg   LMP 04/10/2020   SpO2 99%   BMI 22.31 kg/m  Gen:   Awake, no distress   HEENT:  Atraumatic  Resp:  Normal effort  Cardiac:  Normal rate  Abd:   Nondistended, no CVA tenderness MSK:   Moves extremities without difficulty  Neuro:  Speech clear   Medical Decision Making  Medically screening exam initiated at 10:46 AM.  Appropriate orders placed.  Terri Ramirez was informed that the remainder of the evaluation will be completed by another provider, this initial triage assessment does not replace that evaluation, and the importance of remaining in the ED until their evaluation is complete.  Clinical Impression   Flank pain.  History of pyelonephritis.  Possible recurrence.    Lanier Ensign, PA-C 05/11/20 1049

## 2020-07-05 ENCOUNTER — Emergency Department (HOSPITAL_COMMUNITY)
Admission: EM | Admit: 2020-07-05 | Discharge: 2020-07-05 | Disposition: A | Discharge status: Home / Self Care | Payer: Self-pay | Attending: Emergency Medicine | Admitting: Emergency Medicine

## 2020-07-05 ENCOUNTER — Other Ambulatory Visit: Payer: Self-pay

## 2020-07-05 ENCOUNTER — Encounter (HOSPITAL_COMMUNITY): Payer: Self-pay | Admitting: *Deleted

## 2020-07-05 ENCOUNTER — Emergency Department (HOSPITAL_COMMUNITY): Payer: Self-pay

## 2020-07-05 DIAGNOSIS — R109 Unspecified abdominal pain: Secondary | ICD-10-CM

## 2020-07-05 DIAGNOSIS — Z20822 Contact with and (suspected) exposure to covid-19: Secondary | ICD-10-CM | POA: Insufficient documentation

## 2020-07-05 DIAGNOSIS — R1013 Epigastric pain: Secondary | ICD-10-CM | POA: Insufficient documentation

## 2020-07-05 DIAGNOSIS — R1110 Vomiting, unspecified: Secondary | ICD-10-CM | POA: Insufficient documentation

## 2020-07-05 LAB — COMPREHENSIVE METABOLIC PANEL
ALT: 20 U/L (ref 0–44)
AST: 17 U/L (ref 15–41)
Albumin: 3.9 g/dL (ref 3.5–5.0)
Alkaline Phosphatase: 62 U/L (ref 38–126)
Anion gap: 8 (ref 5–15)
BUN: 9 mg/dL (ref 6–20)
CO2: 25 mmol/L (ref 22–32)
Calcium: 9.2 mg/dL (ref 8.9–10.3)
Chloride: 101 mmol/L (ref 98–111)
Creatinine, Ser: 0.45 mg/dL (ref 0.44–1.00)
GFR, Estimated: >60 mL/min (ref >60)
Glucose, Bld: 120 mg/dL — ABNORMAL HIGH (ref 70–99)
Potassium: 4.1 mmol/L (ref 3.5–5.1)
Sodium: 134 mmol/L — ABNORMAL LOW (ref 135–145)
Total Bilirubin: 1.5 mg/dL — ABNORMAL HIGH (ref 0.3–1.2)
Total Protein: 7.8 g/dL (ref 6.5–8.1)

## 2020-07-05 LAB — CBC
HCT: 39.7 % (ref 36.0–46.0)
Hemoglobin: 13.7 g/dL (ref 12.0–15.0)
MCH: 32.2 pg (ref 26.0–34.0)
MCHC: 34.5 g/dL (ref 30.0–36.0)
MCV: 93.2 fL (ref 80.0–100.0)
Platelets: 276 10*3/uL (ref 150–400)
RBC: 4.26 MIL/uL (ref 3.87–5.11)
RDW: 12 % (ref 11.5–15.5)
WBC: 18.8 10*3/uL — ABNORMAL HIGH (ref 4.0–10.5)
nRBC: 0 % (ref 0.0–0.2)

## 2020-07-05 LAB — URINALYSIS, ROUTINE W REFLEX MICROSCOPIC
Bilirubin Urine: NEGATIVE
Glucose, UA: NEGATIVE mg/dL
Ketones, ur: NEGATIVE mg/dL
Nitrite: NEGATIVE
Protein, ur: 30 mg/dL — AB
Specific Gravity, Urine: 1.012 (ref 1.005–1.030)
pH: 6 (ref 5.0–8.0)

## 2020-07-05 LAB — I-STAT BETA HCG BLOOD, ED (MC, WL, AP ONLY): I-stat hCG, quantitative: <5 m[IU]/mL (ref <5)

## 2020-07-05 LAB — RESP PANEL BY RT-PCR (FLU A&B, COVID) ARPGX2
Influenza A by PCR: NEGATIVE
Influenza B by PCR: NEGATIVE
SARS Coronavirus 2 by RT PCR: NEGATIVE

## 2020-07-05 LAB — LIPASE, BLOOD: Lipase: 25 U/L (ref 11–51)

## 2020-07-05 MED ORDER — SODIUM CHLORIDE 0.9 % IV SOLN
1.0000 g | Freq: Once | INTRAVENOUS | Status: AC
  Administered 2020-07-05: 1 g via INTRAVENOUS
  Filled 2020-07-05: qty 10

## 2020-07-05 MED ORDER — ONDANSETRON 4 MG PO TBDP: 4.0000 mg | ORAL_TABLET | Freq: Three times a day (TID) | ORAL | 0 refills | Status: DC | PRN

## 2020-07-05 MED ORDER — DIPHENHYDRAMINE HCL 50 MG/ML IJ SOLN
12.5000 mg | Freq: Once | INTRAMUSCULAR | Status: AC
  Administered 2020-07-05: 12.5 mg via INTRAVENOUS
  Filled 2020-07-05: qty 1

## 2020-07-05 MED ORDER — SODIUM CHLORIDE (PF) 0.9 % IJ SOLN
INTRAMUSCULAR | Status: AC
  Filled 2020-07-05: qty 50

## 2020-07-05 MED ORDER — SODIUM CHLORIDE 0.9 % IV BOLUS
1000.0000 mL | Freq: Once | INTRAVENOUS | Status: AC
  Administered 2020-07-05: 1000 mL via INTRAVENOUS

## 2020-07-05 MED ORDER — CEFDINIR 300 MG PO CAPS: 300.0000 mg | ORAL_CAPSULE | Freq: Two times a day (BID) | ORAL | 0 refills | Status: DC

## 2020-07-05 MED ORDER — METOCLOPRAMIDE HCL 5 MG/ML IJ SOLN
10.0000 mg | Freq: Once | INTRAMUSCULAR | Status: AC
  Administered 2020-07-05: 10 mg via INTRAVENOUS
  Filled 2020-07-05: qty 2

## 2020-07-05 MED ORDER — IOHEXOL 300 MG/ML  SOLN
100.0000 mL | Freq: Once | INTRAMUSCULAR | Status: AC | PRN
  Administered 2020-07-05: 100 mL via INTRAVENOUS

## 2020-07-05 NOTE — ED Triage Notes (Signed)
Pt complains of abdominal pain, chills, fever x 4 days.

## 2020-07-05 NOTE — Discharge Instructions (Signed)
Please read and follow all provided instructions.  Your diagnoses today include:  1. Flank pain     Tests performed today include: Blood cell counts and platelets - high white blood cell count Kidney and liver function tests Pancreas function test (called lipase) Urine test to look for infection - does show some signs of infection A blood or urine test for pregnancy (women only) CT scan - shows some small bowel that looks inflamed, because you have had this multiple times it would be good for you to follow-up with a gastroenterologist Vital signs. See below for your results today.   Medications prescribed:  Cefdinir - antibiotic for kidney infection  Zofran (ondansetron) - for nausea and vomiting  Take any prescribed medications only as directed.  Home care instructions:  Follow any educational materials contained in this packet.  Follow-up instructions: Please follow-up with your primary care provider or the GI doctor listed in the next 7 days for further evaluation of your symptoms.   Return instructions:  SEEK IMMEDIATE MEDICAL ATTENTION IF: The pain does not go away or becomes severe  A temperature above 101F develops  Repeated vomiting occurs (multiple episodes)  The pain becomes localized to portions of the abdomen. The right side could possibly be appendicitis. In an adult, the left lower portion of the abdomen could be colitis or diverticulitis.  Blood is being passed in stools or vomit (bright red or black tarry stools)  You develop chest pain, difficulty breathing, dizziness or fainting, or become confused, poorly responsive, or inconsolable (young children) If you have any other emergent concerns regarding your health  Additional Information: Abdominal (belly) pain can be caused by many things. Your caregiver performed an examination and possibly ordered blood/urine tests and imaging (CT scan, x-rays, ultrasound). Many cases can be observed and treated at home after  initial evaluation in the emergency department. Even though you are being discharged home, abdominal pain can be unpredictable. Therefore, you need a repeated exam if your pain does not resolve, returns, or worsens. Most patients with abdominal pain don't have to be admitted to the hospital or have surgery, but serious problems like appendicitis and gallbladder attacks can start out as nonspecific pain. Many abdominal conditions cannot be diagnosed in one visit, so follow-up evaluations are very important.  Your vital signs today were: BP 129/73   Pulse 84   Temp 98.3 F (36.8 C) (Oral)   Resp 16   Ht 5\' 4"  (1.626 m)   Wt 61.2 kg   LMP 06/20/2020 Comment: negative HCG blood test 07-05-2020  SpO2 100%   BMI 23.17 kg/m  If your blood pressure (bp) was elevated above 135/85 this visit, please have this repeated by your doctor within one month. --------------

## 2020-07-05 NOTE — ED Provider Notes (Signed)
[facility]-EMERGENCY DEPT Provider Note   CSN: [id number] Arrival date & time: 07/05/20  2536     History Chief Complaint  Patient presents with   Abdominal Pain    Terri Ramirez is a 25 y.o. female.  Patient with history of pyelonephritis in 2020, abnormal CT scan earlier this year but no subsequent GI follow-up --presents the emergency department today for evaluation of abdominal pain, chills, fever, vomiting.  Patient states that Terri Ramirez started feeling ill 4 days ago.  At that time Terri Ramirez had chills, fatigue, fever to 100.9 F, and aches in her back.  2 days ago Terri Ramirez developed more generalized abdominal pain with vomiting.  Terri Ramirez denies dysuria or hematuria but has had increased frequency.  Terri Ramirez also has had intermittent headaches which have been more persistent, but less so this morning.  Headache is currently 2 out of 10.  Home medications have not been helping.  No known sick contacts.  Terri Ramirez had COVID a couple years ago.  The onset of this condition was acute. The course is constant. Aggravating factors: none. Alleviating factors: none.        History reviewed. No pertinent past medical history.  There are no problems to display for this patient.   History reviewed. No pertinent surgical history.   OB History   No obstetric history on file.     Family History  Adopted: Yes    Social History   Tobacco Use   Smoking status: Never   Smokeless tobacco: Never  Vaping Use   Vaping Use: Never used  Substance Use Topics   Alcohol use: Never   Drug use: Yes    Types: Marijuana    Home Medications Prior to Admission medications   Medication Sig Start Date End Date Taking? Authorizing Provider  HYDROcodone-acetaminophen (NORCO/VICODIN) 5-325 MG tablet Take 1-2 tablets by mouth every 6 (six) hours as needed for severe pain. Patient not taking: No sig reported 11/17/18   Joy, Shawn C, PA-C  hydrOXYzine (ATARAX/VISTARIL) 25 MG tablet Take 1 tablet (25 mg  total) by mouth every 6 (six) hours as needed for anxiety. 05/11/20   Joy, Shawn C, PA-C  ibuprofen (ADVIL) 200 MG tablet Take 200 mg by mouth every 6 (six) hours as needed for mild pain.    [provider]  ibuprofen (ADVIL,MOTRIN) 600 MG tablet Take 1 tablet (600 mg total) by mouth every 6 (six) hours as needed. Patient not taking: No sig reported 02/21/18   Jacalyn Lefevre, MD  norgestimate-ethinyl estradiol (SPRINTEC 28) 0.25-35 MG-MCG tablet Take 1 tablet by mouth daily. Patient not taking: Reported on 05/11/2020 08/26/18   McDonald, Mia A, PA-C  ondansetron (ZOFRAN ODT) 4 MG disintegrating tablet Take 1 tablet (4 mg total) by mouth every 8 (eight) hours as needed for nausea or vomiting. Patient not taking: No sig reported 11/17/18   Joy, Shawn C, PA-C  phenazopyridine (PYRIDIUM) 200 MG tablet Take 1 tablet (200 mg total) by mouth 3 (three) times daily. Patient not taking: No sig reported 11/17/18   Harolyn Rutherford C, PA-C    Allergies    Patient has no known allergies.  Review of Systems   Review of Systems  Constitutional:  Positive for appetite change, chills and fever.  HENT:  Negative for rhinorrhea and sore throat.   Eyes:  Negative for redness.  Respiratory:  Negative for cough.   Cardiovascular:  Negative for chest pain.  Gastrointestinal:  Positive for abdominal pain, constipation, nausea and vomiting. Negative for  diarrhea.  Genitourinary:  Positive for flank pain and frequency. Negative for dysuria, hematuria and urgency.  Musculoskeletal:  Positive for back pain and myalgias.  Skin:  Negative for rash.  Neurological:  Positive for headaches.   Physical Exam Updated Vital Signs BP 122/78 (BP Location: Right Arm)   Pulse 80   Temp 98.3 F (36.8 C) (Oral)   Resp 18   Ht 5\' 4"  (1.626 m)   Wt 61.2 kg   LMP 06/20/2020   SpO2 100%   BMI 23.17 kg/m   Physical Exam Vitals and nursing note reviewed.  Constitutional:      General: Terri Ramirez is not in acute distress.     Appearance: Terri Ramirez is well-developed.  HENT:     Head: Normocephalic and atraumatic.     Right Ear: External ear normal.     Left Ear: External ear normal.     Nose: Nose normal.  Eyes:     Conjunctiva/sclera: Conjunctivae normal.  Cardiovascular:     Rate and Rhythm: Normal rate and regular rhythm.     Heart sounds: No murmur heard. Pulmonary:     Effort: No respiratory distress.     Breath sounds: No wheezing, rhonchi or rales.  Abdominal:     Palpations: Abdomen is soft.     Tenderness: There is abdominal tenderness in the epigastric area and left upper quadrant. There is no guarding or rebound. Negative signs include Murphy's sign and McBurney's sign.     Comments: CVA tenderness on the left, mild  Musculoskeletal:     Cervical back: Normal range of motion and neck supple.     Right lower leg: No edema.     Left lower leg: No edema.  Skin:    General: Skin is warm and dry.     Findings: No rash.  Neurological:     General: No focal deficit present.     Mental Status: Terri Ramirez is alert. Mental status is at baseline.     Motor: No weakness.  Psychiatric:        Mood and Affect: Mood normal.    ED Results / Procedures / Treatments   Labs (all labs ordered are listed, but only abnormal results are displayed) Labs Reviewed  COMPREHENSIVE METABOLIC PANEL - Abnormal; Notable for the following components:      Result Value   Sodium 134 (*)    Glucose, Bld 120 (*)    Total Bilirubin 1.5 (*)    All other components within normal limits  CBC - Abnormal; Notable for the following components:   WBC 18.8 (*)    All other components within normal limits  URINALYSIS, ROUTINE W REFLEX MICROSCOPIC - Abnormal; Notable for the following components:   APPearance HAZY (*)    Hgb urine dipstick SMALL (*)    Protein, ur 30 (*)    Leukocytes,Ua MODERATE (*)    Bacteria, UA RARE (*)    Non Squamous Epithelial 0-5 (*)    All other components within normal limits  URINE CULTURE  RESP PANEL BY  RT-PCR (FLU A&B, COVID) ARPGX2  LIPASE, BLOOD  I-STAT BETA HCG BLOOD, ED ([facility], [facility], [facility] ONLY)    EKG None  Radiology CT ABDOMEN PELVIS W CONTRAST  Result Date: 07/05/2020 CLINICAL DATA:  Left lower quadrant pain EXAM: CT ABDOMEN AND PELVIS WITH CONTRAST TECHNIQUE: Multidetector CT imaging of the abdomen and pelvis was performed using the standard protocol following bolus administration of intravenous contrast. CONTRAST:  07/07/2020 OMNIPAQUE IOHEXOL 300 MG/ML  SOLN COMPARISON:  CT 05/11/2020, 10/30/2019 FINDINGS: Lower chest: No acute abnormality. Hepatobiliary: No focal liver abnormality is seen. No gallstones, gallbladder wall thickening, or biliary dilatation. Pancreas: Unremarkable. No pancreatic ductal dilatation or surrounding inflammatory changes. Spleen: Normal in size without focal abnormality. Adrenals/Urinary Tract: Adrenal glands are unremarkable. Kidneys are normal, without renal calculi, focal lesion, or hydronephrosis. Bladder is unremarkable. Stomach/Bowel: Stomach is within normal limits. Appendix not clearly identified. Possible mild wall thickening of jejunal bowel loops in the left upper quadrant and left mid abdomen. Vascular/Lymphatic: No significant vascular findings are present. No enlarged abdominal or pelvic lymph nodes. Reproductive: Uterus and bilateral adnexa are unremarkable. Other: No abdominal wall hernia or abnormality. No abdominopelvic ascites. Musculoskeletal: No acute or significant osseous findings. IMPRESSION: 1. Possible mild wall thickening of left upper quadrant and mid abdominal jejunal small bowel loops suggestive of enteritis either due to infection or inflammatory bowel disease. 2. Otherwise no CT evidence for acute intra-abdominal or pelvic abnormality. Electronically Signed   By: Jasmine Pang M.D.   On: 07/05/2020 15:23    Procedures Procedures   Medications Ordered in ED Medications  sodium chloride (PF) 0.9 % injection (  Not Given 07/05/20 1451)   cefTRIAXone (ROCEPHIN) 1 g in sodium chloride 0.9 % 100 mL IVPB (has no administration in time range)  sodium chloride 0.9 % bolus 1,000 mL (has no administration in time range)  metoCLOPramide (REGLAN) injection 10 mg (10 mg Intravenous Given 07/05/20 1305)  diphenhydrAMINE (BENADRYL) injection 12.5 mg (12.5 mg Intravenous Given 07/05/20 1304)  iohexol (OMNIPAQUE) 300 MG/ML solution 100 mL (100 mLs Intravenous Contrast Given 07/05/20 1430)    ED Course  I have reviewed the triage vital signs and the nursing notes.  Pertinent labs & imaging results that were available during my care of the patient were reviewed by me and considered in my medical decision making (see chart for details).  Patient seen and examined. Work-up initiated. Medications ordered.   Vital signs reviewed and are as follows: BP 122/78 (BP Location: Right Arm)   Pulse 80   Temp 98.3 F (36.8 C) (Oral)   Resp 18   Ht 5\' 4"  (1.626 m)   Wt 61.2 kg   LMP 06/20/2020   SpO2 100%   BMI 23.17 kg/m   Overall, symptoms along with elevated white blood cell count, some white blood cells in urine, CVA tenderness are suggestive of pyelonephritis.  However patient does have moderate left-sided abdominal pain as well.  No focal right lower or right upper quadrant tenderness.  White blood cell count is 18,000.  Shared decision making regarding CT imaging.  Previous CT showed question of short segment intussusception.  We will proceed with imaging today after risks and benefits discussion.  3:40 PM imaging shows thickening of the jejunal consistent with enteritis or inflammatory bowel disease.  Given that patient has had multiple episodes of similar symptoms over the past several months, feel that would be helpful for her to follow-up with gastroenterology for their thoughts.  We discussed possibility of inflammatory bowel disease.   Otherwise, Terri Ramirez does have an elevated white count with white blood cells in the urine with positive CVA  tenderness.  Will cover for pyelonephritis.  Will give IV fluid bolus and IV Rocephin.  If patient does well, feel that Terri Ramirez can be discharged home.  Prescription for oral cefdinir and ODT Zofran.  Patient given referral information for [facility] gastro[facility].  The patient was urged to return to the Emergency Department immediately  with worsening of current symptoms, worsening abdominal pain, persistent vomiting, blood noted in stools, fever, or any other concerns. The patient verbalized understanding.   Roxan Hockeyobinson PA-C aware of patient at shift change.     MDM Rules/Calculators/A&P                          Patient with left-sided abdominal pain and flank pain.  Patient with CT evidence of jejunal thickening, enteritis versus inflammatory bowel disease.  UA with white blood cells and elevated white blood cell count on CBC.  Patient looks well, nontoxic.  Not currently vomiting.  Vital signs look good.  Terri Ramirez had a fever at home --took ibuprofen prior to arrival.  Terri Ramirez received IV fluids and a dose of Rocephin as above.  Terri Ramirez is stable for discharge to home if Terri Ramirez does well.  Strongly encouraged GI follow-up.  Final Clinical Impression(s) / ED Diagnoses Final diagnoses:  Flank pain    Rx / DC Orders ED Discharge Orders          Ordered    cefdinir (OMNICEF) 300 MG capsule  2 times daily        07/05/20 1538    ondansetron (ZOFRAN ODT) 4 MG disintegrating tablet  Every 8 hours PRN        07/05/20 1538             Renne CriglerGeiple, Ryenne Lynam, PA-C 07/05/20 1543    Wynetta FinesMessick, Peter C, MD 07/06/20 [phone]

## 2020-07-07 LAB — URINE CULTURE: Culture: 40000 — AB

## 2021-05-28 ENCOUNTER — Other Ambulatory Visit: Payer: Self-pay

## 2021-05-28 ENCOUNTER — Emergency Department (HOSPITAL_COMMUNITY)
Admission: EM | Admit: 2021-05-28 | Discharge: 2021-05-28 | Disposition: A | Discharge status: Home / Self Care | Payer: Commercial Managed Care - HMO | Attending: Emergency Medicine | Admitting: Emergency Medicine

## 2021-05-28 ENCOUNTER — Encounter (HOSPITAL_COMMUNITY): Payer: Self-pay

## 2021-05-28 ENCOUNTER — Emergency Department (HOSPITAL_COMMUNITY): Payer: Commercial Managed Care - HMO

## 2021-05-28 DIAGNOSIS — R0789 Other chest pain: Secondary | ICD-10-CM | POA: Diagnosis not present

## 2021-05-28 DIAGNOSIS — R079 Chest pain, unspecified: Secondary | ICD-10-CM

## 2021-05-28 HISTORY — DX: Anxiety disorder, unspecified: F41.9

## 2021-05-28 LAB — CBC
HCT: 45.3 % (ref 36.0–46.0)
Hemoglobin: 15.4 g/dL — ABNORMAL HIGH (ref 12.0–15.0)
MCH: 32.4 pg (ref 26.0–34.0)
MCHC: 34 g/dL (ref 30.0–36.0)
MCV: 95.4 fL (ref 80.0–100.0)
Platelets: 247 10*3/uL (ref 150–400)
RBC: 4.75 MIL/uL (ref 3.87–5.11)
RDW: 12.1 % (ref 11.5–15.5)
WBC: 10.8 10*3/uL — ABNORMAL HIGH (ref 4.0–10.5)
nRBC: 0 % (ref 0.0–0.2)

## 2021-05-28 LAB — BASIC METABOLIC PANEL
Anion gap: 9 (ref 5–15)
BUN: 6 mg/dL (ref 6–20)
CO2: 20 mmol/L — ABNORMAL LOW (ref 22–32)
Calcium: 8.9 mg/dL (ref 8.9–10.3)
Chloride: 105 mmol/L (ref 98–111)
Creatinine, Ser: 0.57 mg/dL (ref 0.44–1.00)
GFR, Estimated: >60 mL/min (ref >60)
Glucose, Bld: 118 mg/dL — ABNORMAL HIGH (ref 70–99)
Potassium: 3.7 mmol/L (ref 3.5–5.1)
Sodium: 134 mmol/L — ABNORMAL LOW (ref 135–145)

## 2021-05-28 LAB — I-STAT BETA HCG BLOOD, ED (MC, WL, AP ONLY): I-stat hCG, quantitative: <5 m[IU]/mL (ref <5)

## 2021-05-28 LAB — TROPONIN I (HIGH SENSITIVITY): Troponin I (High Sensitivity): <2 ng/L (ref <18)

## 2021-05-28 NOTE — ED Provider Notes (Signed)
?[facility]PT ?Provider Note ? ? ?CSN: [id number] ?Arrival date & time: 05/28/21  3716 ? ?  ? ?History ? ?Chief Complaint  ?Patient presents with  ? Chest Pain  ? heart fluttering  ? ? ?Terri Ramirez is a 26 y.o. female. ? ?HPI ?26 year old female history of anxiety presents today with some chest pressure.  He states that the pressure began yesterday in the afternoon lasted for several hours and she was able to go to sleep last night.  Today when she is on her way to work the pain returned.  It feels like a brick on the front of her chest.  She denies any associated symptoms, specifically she does not have any fever, chills, shortness of breath, leg swelling, history of DVT or PE.  States that she did vomit 1 time yesterday prior to the resolution of her symptoms.  Is unclear whether or not this caused the resolution.  She states she has had these symptoms and has passed out multiple times in the past several times thought to be related to her anxiety and other times unclear etiology.  She states she has been seen here for these events.  She does not think that she is pregnant and has regular menses.  She denies any illicit drug use. ? ?HPI: A 26 year old patient presents for evaluation of chest pain. Initial onset of pain was more than 6 hours ago. The patient's chest pain is described as heaviness/pressure/tightness and is not worse with exertion. The patient complains of nausea. The patient's chest pain is middle- or left-sided, is not well-localized, is not sharp and does not radiate to the arms/jaw/neck. The patient denies diaphoresis. The patient has no history of stroke, has no history of peripheral artery disease, has not smoked in the past 90 days, denies any history of treated diabetes, has no relevant family history of coronary artery disease (first degree relative at less than age 72), is not hypertensive, has no history of hypercholesterolemia and does not have an  elevated BMI (>=30).  ? ?Home Medications ?Prior to Admission medications   ?Medication Sig Start Date End Date Taking? Authorizing Provider  ?cefdinir (OMNICEF) 300 MG capsule Take 1 capsule (300 mg total) by mouth 2 (two) times daily. 07/05/20   Renne Crigler, PA-C  ?hydrOXYzine (ATARAX/VISTARIL) 25 MG tablet Take 1 tablet (25 mg total) by mouth every 6 (six) hours as needed for anxiety. 05/11/20   Joy, Shawn C, PA-C  ?ibuprofen (ADVIL) 200 MG tablet Take 200 mg by mouth every 6 (six) hours as needed for mild pain.    [provider]  ?ondansetron (ZOFRAN ODT) 4 MG disintegrating tablet Take 1 tablet (4 mg total) by mouth every 8 (eight) hours as needed for nausea or vomiting. 07/05/20   Renne Crigler, PA-C  ?   ? ?Allergies    ?Patient has no known allergies.   ? ?Review of Systems   ?Review of Systems ? ?Physical Exam ?Updated Vital Signs ?BP 111/64   Pulse 67   Temp 98.5 ?F (36.9 ?C) (Oral)   Resp 18   Ht 1.6 m (5\' 3" )   Wt 62.6 kg   LMP 04/28/2021 (Approximate)   SpO2 100%   BMI 24.45 kg/m?  ?Physical Exam ?Vitals and nursing note reviewed.  ?Constitutional:   ?   General: She is not in acute distress. ?   Appearance: She is well-developed.  ?HENT:  ?   Head: Normocephalic and atraumatic.  ?   Right Ear:  External ear normal.  ?   Left Ear: External ear normal.  ?   Nose: Nose normal.  ?Eyes:  ?   Conjunctiva/sclera: Conjunctivae normal.  ?   Pupils: Pupils are equal, round, and reactive to light.  ?Cardiovascular:  ?   Rate and Rhythm: Normal rate and regular rhythm.  ?   Heart sounds: Normal heart sounds.  ?Pulmonary:  ?   Effort: Pulmonary effort is normal.  ?   Breath sounds: Normal breath sounds.  ?Abdominal:  ?   General: Bowel sounds are normal.  ?   Palpations: Abdomen is soft.  ?Musculoskeletal:     ?   General: Normal range of motion.  ?   Cervical back: Normal range of motion and neck supple.  ?Skin: ?   General: Skin is warm and dry.  ?Neurological:  ?   Mental Status: She is alert  and oriented to person, place, and time.  ?   Motor: No abnormal muscle tone.  ?   Coordination: Coordination normal.  ?Psychiatric:     ?   Behavior: Behavior normal.     ?   Thought Content: Thought content normal.  ? ? ?ED Results / Procedures / Treatments   ?Labs ?(all labs ordered are listed, but only abnormal results are displayed) ?Labs Reviewed  ?BASIC METABOLIC PANEL - Abnormal; Notable for the following components:  ?    Result Value  ? Sodium 134 (*)   ? CO2 20 (*)   ? Glucose, Bld 118 (*)   ? All other components within normal limits  ?CBC - Abnormal; Notable for the following components:  ? WBC 10.8 (*)   ? Hemoglobin 15.4 (*)   ? All other components within normal limits  ?I-STAT BETA HCG BLOOD, ED ([facility], [facility], [facility] ONLY)  ?TROPONIN I (HIGH SENSITIVITY)  ?TROPONIN I (HIGH SENSITIVITY)  ? ? ?EKG ?EKG Interpretation ? ?Date/Time:  Monday May 28 2021 08:31:47 EDT ?Ventricular Rate:  74 ?PR Interval:  122 ?QRS Duration: 98 ?QT Interval:  357 ?QTC Calculation: 396 ?R Axis:   80 ?Text Interpretation: Sinus rhythm RSR' in V1 or V2, right VCD or RVH Confirmed by Margarita Grizzle [phone]) on 05/28/2021 8:39:09 AM ? ?Radiology ?DG Chest 2 View ? ?Result Date: 05/28/2021 ?CLINICAL DATA:  Chest pain. EXAM: CHEST - 2 VIEW COMPARISON:  November 17, 2018 chest x-Terri Ramirez. FINDINGS: No consolidation. No visible pleural effusions or pneumothorax. Cardiomediastinal silhouette is within normal limits. No displaced fracture. IMPRESSION: No evidence of acute cardiopulmonary disease. Electronically Signed   By: Feliberto Harts M.D.   On: 05/28/2021 08:44   ? ?Procedures ?Procedures  ? ? ?Medications Ordered in ED ?Medications - No data to display ? ?ED Course/ Medical Decision Making/ A&P ?Clinical Course as of 05/28/21 1024  ?Mon May 28, 2021  ?7017 CBC(!) ?CBC reviewed interpreted and mild leukocytosis of 10,800 noted ?Hemoglobin slightly elevated at 15,400 ?Platelets normal 247,000 ?I-STAT beta-hCG reviewed interpreted and less than 5  indicating a pregnancy [DR]  ?7939 Chest x-Terri Ramirez reviewed interpreted no evidence of acute abnormality noted radiologist interpretation reviewed and concurs [DR]  ?  ?Clinical Course User Index ?[DR] Margarita Grizzle, MD  ? ?HEAR Score: 2                       ?Medical Decision Making ?Patient seen and evaluated for chest pain.  Differential diagnosis of serious/life threatening causes of chest pain includes ACS, other diseases of the heart such as myocarditis  or pericarditis, lung etiologies such as infection or pneumothorax, diseases of the great vessels such as aortic dissection or AAA, pulmonary embolism, or GI sources such as cholecystitis or other upper abdominal causes. ?Doubt ACS- heart score documented, EKG reviewed, Given the timing of pain to ER presentation, single troponin*, was * so doubt NSTEMI troponin and repeat troponin obtained and WNL ?Doubt myocarditis/pericarditis/tamponade based on history, review of ekg and labs ?Doubt aortic dissection based on history and review of imaging ?Doubt intrinsic lung causes such as pneumonia or pneumothorax, based on history, physical exam, and studies obtained. ?Doubt PE based on history, physical exam, and PERC ?Doubt acute GI etiology requiring intervention based on history, physical exam and labs. ?Patient appears stable for discharge. ?Return precautions and need for follow up discussed and patient voices understanding  ? ?Amount and/or Complexity of Data Reviewed ?Labs: ordered. Decision-making details documented in ED Course. ?Radiology: ordered. ?Discussion of management or test interpretation with external provider(s): Patient with atypical chest pain.  Multiple previous evaluations for similar.  Low index of suspicion for significant disease as noted above.  Work up c.w. patient's prior anxiety. ?Advised re return precautions and need for follow up. ? ?Risk ?Decision regarding hospitalization. ? ? ? ? ? ? ? ? ? ? ?Final Clinical Impression(s) / ED  Diagnoses ?Final diagnoses:  ?Chest pain, unspecified type  ? ? ?Rx / DC Orders ?ED Discharge Orders   ? ? None  ? ?  ? ? ?  ?Margarita Grizzleay, Hadrian Yarbrough, MD ?05/28/21 1024 ? ?

## 2021-05-28 NOTE — ED Triage Notes (Signed)
Patient reports that she began having left chest pain and heart fluttering yesterday at 1400. Patient states she fell asleep and felt it again on the way to work today. Patient does report a history of anxiety. ?

## 2022-02-15 ENCOUNTER — Encounter: Payer: Self-pay | Admitting: Physician Assistant

## 2022-02-15 ENCOUNTER — Ambulatory Visit: Payer: BC Managed Care – PPO | Admitting: Physician Assistant

## 2022-02-15 VITALS — BP 110/60 | HR 59 | Temp 97.5°F | Ht 64.0 in | Wt 129.6 lb

## 2022-02-15 DIAGNOSIS — M2141 Flat foot [pes planus] (acquired), right foot: Secondary | ICD-10-CM

## 2022-02-15 DIAGNOSIS — Z23 Encounter for immunization: Secondary | ICD-10-CM

## 2022-02-15 DIAGNOSIS — F419 Anxiety disorder, unspecified: Secondary | ICD-10-CM

## 2022-02-15 NOTE — Patient Instructions (Signed)
Welcome to [facility] at [facility]! It was a pleasure meeting you today.  As discussed, Please schedule a 1 month follow up visit today.  PLEASE NOTE:  If you had any LAB tests please let us know if you have not heard back within a few days. You may see your results on MyChart before we have a chance to review them but we will give you a call once they are reviewed by us. If we ordered any REFERRALS today, please let us know if you have not heard from their office within the next two weeks. Let us know through MyChart if you are needing REFILLS, or have your pharmacy send us the request. You can also use MyChart to communicate with me or any office staff.  Please try these tips to maintain a healthy lifestyle:  Eat most of your calories during the day when you are active. Eliminate processed foods including packaged sweets (pies, cakes, cookies), reduce intake of potatoes, white bread, white pasta, and white rice. Look for whole grain options, oat flour or almond flour.  Each meal should contain half fruits/vegetables, one quarter protein, and one quarter carbs (no bigger than a computer mouse).  Cut down on sweet beverages. This includes juice, soda, and sweet tea. Also watch fruit intake, though this is a healthier sweet option, it still contains natural sugar! Limit to 3 servings daily.  Drink at least 1 glass of water with each meal and aim for at least 8 glasses (64 ounces) per day.  Exercise at least 150 minutes every week to the best of your ability.    Take Care,  Dresden Lozito, PA-C    

## 2022-02-15 NOTE — Assessment & Plan Note (Signed)
Referral to PT at our office with Ander Purpura

## 2022-02-15 NOTE — Progress Notes (Signed)
Subjective:    Patient ID: Terri Ramirez, female    DOB: Aug 13, 1995, 27 y.o.   MRN: [id number]  Chief Complaint  Patient presents with   New Patient (Initial Visit)    NP in office to establish care with PCP; routine check up, never been really checked out by a doctor except in ER for something. Was told she needed gastrointestinal surgery discuss with patient; pt was prescribed meds for anxiety and ran out but has been managing without it, admits she does have panic attacks from time to time; pt getting TDAP and flu vaccines today    HPI 27 y.o. patient presents today for new patient establishment with me. 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 Enjoys time with her best-friend who is expecting.   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 No pertinent surgical history.  Family History  Adopted: Yes  Family history unknown: Yes    Social History   Tobacco Use   Smoking status: Never   Smokeless tobacco: Never  Vaping Use   Vaping Use: Never used  Substance Use Topics   Alcohol use: Not Currently   Drug use: Not Currently    Types: Marijuana     No Known Allergies  Review of Systems NEGATIVE UNLESS OTHERWISE INDICATED IN HPI      Objective:     BP 110/60 (BP Location: Left Arm)   Pulse (!) 59   Temp (!) 97.5 F (36.4 C) (Temporal)   Ht 5\' 4"  (1.626 m)   Wt 129 lb 9.6 oz (58.8 kg)   LMP 02/07/2022 (Exact Date)   SpO2 99%   BMI 22.25 kg/m   Wt Readings from Last 3 Encounters:  02/15/22 129 lb 9.6 oz (58.8 kg)  05/28/21 138 lb (62.6 kg)  07/05/20 135 lb (61.2 kg)    BP Readings from Last 3 Encounters:  02/15/22 110/60  05/28/21 111/64  07/05/20 116/71      Physical Exam Vitals and nursing note reviewed.  Constitutional:      Appearance: Normal appearance. She is normal weight. She is not toxic-appearing.  HENT:     Head: Normocephalic and atraumatic.     Nose: Nose normal.  Eyes:     Extraocular Movements: Extraocular movements intact.     Conjunctiva/sclera: Conjunctivae normal.     Pupils: Pupils are equal, round, and reactive to light.  Cardiovascular:     Rate and Rhythm: Normal rate and regular rhythm.     Pulses: Normal pulses.     Heart sounds: Normal heart sounds.  Pulmonary:     Effort: Pulmonary effort is normal.     Breath sounds: Normal breath sounds.  Musculoskeletal:        General: No swelling or deformity. Normal range of motion.     Cervical back: Normal range of motion and neck supple.     Comments: Right foot, flat arch, hyper-mobile around ankle  Skin:    General: Skin is warm and dry.  Neurological:     General: No focal deficit present.     Mental Status: She is alert and oriented to person, place, and time.  Psychiatric:        Mood and Affect: Mood normal.        Behavior: Behavior normal.        Thought Content: Thought content normal.        Judgment: Judgment normal.        Assessment & Plan:  Hypermobile flat foot, right Assessment & Plan: Referral to PT at our office with Lauren   Orders: -     Ambulatory referral to Physical Therapy  Anxiety Assessment & Plan: Overall doing ok, stable at this time, will be following with counseling soon. Will reach out if needing additional help.    Need for prophylactic vaccination with combined diphtheria-tetanus-pertussis (DTP) vaccine -     Tdap vaccine greater than or equal to 7yo IM    Very pleasant new patient establishment today, Will see her back soon for annual physical. Pt to call sooner if any questions or concerns.     Return in about 1 month (around 03/16/2022) for CPE, fasting labs .    Issiah Huffaker M Caedon Bond, PA-C

## 2022-02-15 NOTE — Assessment & Plan Note (Signed)
Overall doing ok, stable at this time, will be following with counseling soon. Will reach out if needing additional help.

## 2022-02-20 DIAGNOSIS — F4312 Post-traumatic stress disorder, chronic: Secondary | ICD-10-CM | POA: Diagnosis not present

## 2022-02-26 DIAGNOSIS — F4312 Post-traumatic stress disorder, chronic: Secondary | ICD-10-CM | POA: Diagnosis not present

## 2022-03-06 ENCOUNTER — Ambulatory Visit: Payer: BC Managed Care – PPO | Admitting: Physical Therapy

## 2022-03-06 DIAGNOSIS — Z113 Encounter for screening for infections with a predominantly sexual mode of transmission: Secondary | ICD-10-CM | POA: Diagnosis not present

## 2022-03-06 DIAGNOSIS — F4312 Post-traumatic stress disorder, chronic: Secondary | ICD-10-CM | POA: Diagnosis not present

## 2022-03-06 DIAGNOSIS — Z124 Encounter for screening for malignant neoplasm of cervix: Secondary | ICD-10-CM | POA: Diagnosis not present

## 2022-03-06 DIAGNOSIS — Z01419 Encounter for gynecological examination (general) (routine) without abnormal findings: Secondary | ICD-10-CM | POA: Diagnosis not present

## 2022-03-06 DIAGNOSIS — Z1151 Encounter for screening for human papillomavirus (HPV): Secondary | ICD-10-CM | POA: Diagnosis not present

## 2022-03-07 ENCOUNTER — Ambulatory Visit: Payer: Commercial Managed Care - HMO | Admitting: Physical Therapy

## 2022-03-12 ENCOUNTER — Ambulatory Visit (INDEPENDENT_AMBULATORY_CARE_PROVIDER_SITE_OTHER): Payer: Commercial Managed Care - HMO | Admitting: Physician Assistant

## 2022-03-12 ENCOUNTER — Other Ambulatory Visit: Payer: Self-pay | Admitting: Physician Assistant

## 2022-03-12 ENCOUNTER — Encounter: Payer: Self-pay | Admitting: Physician Assistant

## 2022-03-12 ENCOUNTER — Telehealth: Payer: Self-pay | Admitting: Physician Assistant

## 2022-03-12 VITALS — BP 102/72 | HR 76 | Temp 97.8°F | Ht 64.0 in | Wt 130.6 lb

## 2022-03-12 DIAGNOSIS — Z0000 Encounter for general adult medical examination without abnormal findings: Secondary | ICD-10-CM | POA: Diagnosis not present

## 2022-03-12 DIAGNOSIS — Z131 Encounter for screening for diabetes mellitus: Secondary | ICD-10-CM | POA: Diagnosis not present

## 2022-03-12 DIAGNOSIS — F419 Anxiety disorder, unspecified: Secondary | ICD-10-CM | POA: Diagnosis not present

## 2022-03-12 DIAGNOSIS — Z23 Encounter for immunization: Secondary | ICD-10-CM | POA: Diagnosis not present

## 2022-03-12 DIAGNOSIS — F4312 Post-traumatic stress disorder, chronic: Secondary | ICD-10-CM | POA: Diagnosis not present

## 2022-03-12 LAB — COMPREHENSIVE METABOLIC PANEL
ALT: 19 U/L (ref 0–35)
AST: 18 U/L (ref 0–37)
Albumin: 4 g/dL (ref 3.5–5.2)
Alkaline Phosphatase: 58 U/L (ref 39–117)
BUN: 10 mg/dL (ref 6–23)
CO2: 25 mEq/L (ref 19–32)
Calcium: 9.7 mg/dL (ref 8.4–10.5)
Chloride: 107 mEq/L (ref 96–112)
Creatinine, Ser: 0.5 mg/dL (ref 0.40–1.20)
GFR: 130.55 mL/min (ref >60.00)
Glucose, Bld: 96 mg/dL (ref 70–99)
Potassium: 4.1 mEq/L (ref 3.5–5.1)
Sodium: 140 mEq/L (ref 135–145)
Total Bilirubin: 0.9 mg/dL (ref 0.2–1.2)
Total Protein: 6.8 g/dL (ref 6.0–8.3)

## 2022-03-12 LAB — CBC WITH DIFFERENTIAL/PLATELET
Basophils Absolute: 0 10*3/uL (ref 0.0–0.1)
Basophils Relative: 0.6 % (ref 0.0–3.0)
Eosinophils Absolute: 0.1 10*3/uL (ref 0.0–0.7)
Eosinophils Relative: 1 % (ref 0.0–5.0)
HCT: 39.1 % (ref 36.0–46.0)
Hemoglobin: 13.2 g/dL (ref 12.0–15.0)
Lymphocytes Relative: 32 % (ref 12.0–46.0)
Lymphs Abs: 2.3 10*3/uL (ref 0.7–4.0)
MCHC: 33.8 g/dL (ref 30.0–36.0)
MCV: 95.7 fl (ref 78.0–100.0)
Monocytes Absolute: 0.4 10*3/uL (ref 0.1–1.0)
Monocytes Relative: 6.1 % (ref 3.0–12.0)
Neutro Abs: 4.3 10*3/uL (ref 1.4–7.7)
Neutrophils Relative %: 60.3 % (ref 43.0–77.0)
Platelets: 244 10*3/uL (ref 150.0–400.0)
RBC: 4.08 Mil/uL (ref 3.87–5.11)
RDW: 12.4 % (ref 11.5–15.5)
WBC: 7.1 10*3/uL (ref 4.0–10.5)

## 2022-03-12 LAB — HEMOGLOBIN A1C: Hgb A1c MFr Bld: 5.6 % (ref 4.6–6.5)

## 2022-03-12 LAB — LIPID PANEL
Cholesterol: 158 mg/dL (ref 0–200)
HDL: 58.1 mg/dL (ref >39.00)
LDL Cholesterol: 87 mg/dL (ref 0–99)
NonHDL: 99.74
Total CHOL/HDL Ratio: 3
Triglycerides: 66 mg/dL (ref 0.0–149.0)
VLDL: 13.2 mg/dL (ref 0.0–40.0)

## 2022-03-12 LAB — HM PAP SMEAR

## 2022-03-12 LAB — TSH: TSH: 1.41 u[IU]/mL (ref 0.35–5.50)

## 2022-03-12 MED ORDER — MIRTAZAPINE 7.5 MG PO TABS: 7.5000 mg | ORAL_TABLET | Freq: Every day | ORAL | 0 refills | Status: DC

## 2022-03-12 NOTE — Telephone Encounter (Signed)
Please see note per patient call and advise

## 2022-03-12 NOTE — Progress Notes (Signed)
Subjective:    Patient ID: Terri Ramirez, female    DOB: 1995/03/10, 27 y.o.   MRN: [id number]  Chief Complaint  Patient presents with   Annual Exam    Pt in office for annual CPE and fasting labs; pt has no concerns, pt just had some labs and OBGYN visit to complete Pap Smear and requested results    HPI Patient is in today for annual exam.   Health maintenance: Lifestyle/ exercise: Staying active / on feet at work  Nutrition: Usually eats once day, snacking sometimes; protein shakes; not much of appetite, some binging she states  Mental health: Anxiety, seeing a counselor, discussing appetite as well  Caffeine: Rarely Sleep: Difficulty sleeping / falling asleep Substance use: Marijuana use  Sexual activity: one lifetime partner, no longer sexually active at this time  Immunizations: UTD Pap: Just completed well woman pap with Dr. Judge Stall, requesting records. Currently on treatment for BV and chlamydia.   Past Medical History:  Diagnosis Date   Anxiety    Low blood pressure    Low sodium levels     History reviewed. No pertinent surgical history.  Family History  Adopted: Yes  Family history unknown: Yes    Social History   Tobacco Use   Smoking status: Never   Smokeless tobacco: Never  Vaping Use   Vaping Use: Never used  Substance Use Topics   Alcohol use: Not Currently   Drug use: Not Currently    Types: Marijuana     No Known Allergies  Review of Systems NEGATIVE UNLESS OTHERWISE INDICATED IN HPI      Objective:     BP 102/72 (BP Location: Left Arm)   Pulse 76   Temp 97.8 F (36.6 C) (Temporal)   Ht '5\' 4"'$  (1.626 m)   Wt 130 lb 9.6 oz (59.2 kg)   LMP 03/10/2022 (Exact Date)   SpO2 97%   BMI 22.42 kg/m   Wt Readings from Last 3 Encounters:  03/12/22 130 lb 9.6 oz (59.2 kg)  02/15/22 129 lb 9.6 oz (58.8 kg)  05/28/21 138 lb (62.6 kg)    BP Readings from Last 3 Encounters:  03/12/22 102/72  02/15/22 110/60  05/28/21 111/64      Physical Exam Vitals and nursing note reviewed.  Constitutional:      Appearance: Normal appearance. She is normal weight. She is not toxic-appearing.  HENT:     Head: Normocephalic and atraumatic.     Right Ear: External ear normal.     Left Ear: External ear normal.     Nose: Nose normal.     Mouth/Throat:     Mouth: Mucous membranes are moist.  Eyes:     Extraocular Movements: Extraocular movements intact.     Conjunctiva/sclera: Conjunctivae normal.     Pupils: Pupils are equal, round, and reactive to light.  Cardiovascular:     Rate and Rhythm: Normal rate and regular rhythm.     Pulses: Normal pulses.     Heart sounds: Normal heart sounds.  Pulmonary:     Effort: Pulmonary effort is normal.     Breath sounds: Normal breath sounds.  Musculoskeletal:     Cervical back: Normal range of motion and neck supple.  Skin:    General: Skin is warm and dry.  Neurological:     General: No focal deficit present.     Mental Status: She is alert and oriented to person, place, and time.  Psychiatric:  Mood and Affect: Mood normal.        Behavior: Behavior normal.        Thought Content: Thought content normal.        Judgment: Judgment normal.        Assessment & Plan:  Encounter for annual physical exam -     CBC with Differential/Platelet -     Comprehensive metabolic panel -     Lipid panel -     Hemoglobin A1c -     TSH  Need for HPV vaccination -     HPV 9-valent vaccine,Recombinat  Diabetes mellitus screening -     Comprehensive metabolic panel -     Hemoglobin A1c  Anxiety Assessment & Plan: Using marijuana in the evenings to help with anxiety, appetite stimulation, and sleep. Will discuss with counseling. Consider SSRI such as remeron to help with symptoms. We can discuss again next visit.     Age-appropriate screening and counseling performed today. Will check labs and call with results. Preventive measures discussed and printed in AVS for patient.    Patient Counseling: '[x]'$   Nutrition: Stressed importance of moderation in sodium/caffeine intake, saturated fat and cholesterol, caloric balance, sufficient intake of fresh fruits, vegetables, and fiber.  '[x]'$   Stressed the importance of regular exercise.   '[]'$   Substance Abuse: Discussed cessation/primary prevention of tobacco, alcohol, or other drug use; driving or other dangerous activities under the influence; availability of treatment for abuse.   '[]'$   Injury prevention: Discussed safety belts, safety helmets, smoke detector, smoking near bedding or upholstery.   '[x]'$   Sexuality: Discussed sexually transmitted diseases, partner selection, use of condoms, avoidance of unintended pregnancy  and contraceptive alternatives.   '[x]'$   Dental health: Discussed importance of regular tooth brushing, flossing, and dental visits.  '[x]'$   Health maintenance and immunizations reviewed. Please refer to Health maintenance section.         Return in about 8 days (around 03/20/2022) for recheck / discussion about med possibility - schedule around PT time .    Othel Hoogendoorn M Eda Magnussen, PA-C

## 2022-03-12 NOTE — Telephone Encounter (Signed)
Patient states Therapist agrees with the new RX for Remeron.  Patient requests RX for Remeron be sent to:   [id number] [address] [facility] [address] Phone: [phone] Fax: [phone]

## 2022-03-12 NOTE — Assessment & Plan Note (Signed)
Using marijuana in the evenings to help with anxiety, appetite stimulation, and sleep. Will discuss with counseling. Consider SSRI such as remeron to help with symptoms. We can discuss again next visit.

## 2022-03-20 ENCOUNTER — Ambulatory Visit: Payer: Commercial Managed Care - HMO | Admitting: Physical Therapy

## 2022-03-20 ENCOUNTER — Ambulatory Visit: Payer: Commercial Managed Care - HMO | Admitting: Physician Assistant

## 2022-03-20 DIAGNOSIS — F4312 Post-traumatic stress disorder, chronic: Secondary | ICD-10-CM | POA: Diagnosis not present

## 2022-03-21 ENCOUNTER — Ambulatory Visit: Payer: BC Managed Care – PPO | Admitting: Physician Assistant

## 2022-03-26 DIAGNOSIS — F4312 Post-traumatic stress disorder, chronic: Secondary | ICD-10-CM | POA: Diagnosis not present

## 2022-03-29 ENCOUNTER — Ambulatory Visit: Payer: BC Managed Care – PPO | Admitting: Physical Therapy

## 2022-03-29 DIAGNOSIS — M2141 Flat foot [pes planus] (acquired), right foot: Secondary | ICD-10-CM

## 2022-03-29 DIAGNOSIS — M25561 Pain in right knee: Secondary | ICD-10-CM | POA: Diagnosis not present

## 2022-03-29 DIAGNOSIS — M25571 Pain in right ankle and joints of right foot: Secondary | ICD-10-CM

## 2022-03-29 DIAGNOSIS — G8929 Other chronic pain: Secondary | ICD-10-CM | POA: Diagnosis not present
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
## 2022-03-30 ENCOUNTER — Encounter: Payer: Self-pay | Admitting: Physical Therapy

## 2022-04-03 ENCOUNTER — Encounter: Payer: Self-pay | Admitting: Physician Assistant

## 2022-04-03 ENCOUNTER — Ambulatory Visit: Payer: BC Managed Care – PPO | Admitting: Physician Assistant

## 2022-04-03 VITALS — BP 110/64 | HR 90 | Temp 98.4°F | Ht 64.0 in | Wt 137.2 lb

## 2022-04-03 DIAGNOSIS — F419 Anxiety disorder, unspecified: Secondary | ICD-10-CM | POA: Diagnosis not present

## 2022-04-03 DIAGNOSIS — B009 Herpesviral infection, unspecified: Secondary | ICD-10-CM

## 2022-04-03 DIAGNOSIS — F4312 Post-traumatic stress disorder, chronic: Secondary | ICD-10-CM | POA: Diagnosis not present

## 2022-04-03 MED ORDER — VALACYCLOVIR HCL 1 G PO TABS: ORAL_TABLET | ORAL | 1 refills | Status: DC

## 2022-04-03 NOTE — Progress Notes (Signed)
Subjective:    Patient ID: Terri Ramirez, female    DOB: 30-May-1995, 27 y.o.   MRN: [id number]  Chief Complaint  Patient presents with   Follow-up    Pt in the office; discussion regarding medication possibility; wants to discuss bumps that just came up on lip; known to get cold sores as well;     HPI Patient is in today for medication discussion.  Usually every winter she has cold sore virus that pops up, usually lasts about 5 days, goes away on its own.   More stressed recently. Has constant running thoughts like what is she doing for the day, what's the next thing, - and wishes she could escape her mind. Thinks this is why she uses marijuana so much. Still working with a Veterinary surgeon. Started on Remeron in the evenings, not sure it's helping much, but does seem to be sleeping better. Doesn't have much family support.     Past Medical History:  Diagnosis Date   Anxiety    Low blood pressure    Low sodium levels     History reviewed. No pertinent surgical history.  Family History  Adopted: Yes  Family history unknown: Yes    Social History   Tobacco Use   Smoking status: Never   Smokeless tobacco: Never  Vaping Use   Vaping Use: Never used  Substance Use Topics   Alcohol use: Not Currently   Drug use: Not Currently    Types: Marijuana     No Known Allergies  Review of Systems NEGATIVE UNLESS OTHERWISE INDICATED IN HPI      Objective:     BP 110/64 (BP Location: Left Arm)   Pulse 90   Temp 98.4 F (36.9 C) (Temporal)   Ht 5\' 4"  (1.626 m)   Wt 137 lb 3.2 oz (62.2 kg)   LMP 03/10/2022 (Exact Date)   SpO2 97%   BMI 23.55 kg/m   Wt Readings from Last 3 Encounters:  04/03/22 137 lb 3.2 oz (62.2 kg)  03/12/22 130 lb 9.6 oz (59.2 kg)  02/15/22 129 lb 9.6 oz (58.8 kg)    BP Readings from Last 3 Encounters:  04/03/22 110/64  03/12/22 102/72  02/15/22 110/60     Physical Exam Vitals and nursing note reviewed.  Constitutional:      Appearance:  Normal appearance.  Eyes:     Extraocular Movements: Extraocular movements intact.     Conjunctiva/sclera: Conjunctivae normal.     Pupils: Pupils are equal, round, and reactive to light.  Cardiovascular:     Rate and Rhythm: Normal rate and regular rhythm.  Pulmonary:     Effort: Pulmonary effort is normal.     Breath sounds: Normal breath sounds.  Skin:    Findings: Lesion (right upper lip cluster of small vesicles) present.  Neurological:     Mental Status: She is alert.  Psychiatric:     Comments: Somewhat tearful         Assessment & Plan:  Herpes simplex infection Assessment & Plan: Active sore today. Usually about one per year in winter per pt. Will take Valtrex 1 g once daily for 5 days.    Anxiety Assessment & Plan: Started Remeron 7.5 mg daily. Not much change, but sleeping better. Will consider increase to 15 mg. Keep up with counseling, needs to find freedom for her thoughts other than cannabis use.     Other orders -     valACYclovir HCl; Take 1 g once daily for  5 days  Dispense: 20 tablet; Refill: 1      Return in about 4 weeks (around 05/01/2022) for recheck .  This note was prepared with assistance of Conservation officer, historic buildings. Occasional wrong-word or sound-a-like substitutions may have occurred due to the inherent limitations of voice recognition software.  Time Spent: 39 minutes of total time was spent on the date of the encounter performing the following actions: chart review prior to seeing the patient, obtaining history, performing a medically necessary exam, counseling on the treatment plan, placing orders, and documenting in our EHR.       Jedd Schulenburg M Brent Taillon, PA-C

## 2022-04-04 DIAGNOSIS — B009 Herpesviral infection, unspecified: Secondary | ICD-10-CM | POA: Insufficient documentation

## 2022-04-04 NOTE — Assessment & Plan Note (Signed)
Started Remeron 7.5 mg daily. Not much change, but sleeping better. Will consider increase to 15 mg. Keep up with counseling, needs to find freedom for her thoughts other than cannabis use.

## 2022-04-04 NOTE — Assessment & Plan Note (Signed)
Active sore today. Usually about one per year in winter per pt. Will take Valtrex 1 g once daily for 5 days.

## 2022-04-07 ENCOUNTER — Other Ambulatory Visit: Payer: Self-pay | Admitting: Physician Assistant

## 2022-04-09 DIAGNOSIS — F4312 Post-traumatic stress disorder, chronic: Secondary | ICD-10-CM | POA: Diagnosis not present

## 2022-04-17 ENCOUNTER — Encounter: Payer: Commercial Managed Care - HMO | Admitting: Physical Therapy

## 2022-04-17 DIAGNOSIS — F4312 Post-traumatic stress disorder, chronic: Secondary | ICD-10-CM | POA: Diagnosis not present

## 2022-04-23 ENCOUNTER — Encounter: Payer: BC Managed Care – PPO | Admitting: Physical Therapy

## 2022-05-01 ENCOUNTER — Encounter: Payer: BC Managed Care – PPO | Admitting: Physical Therapy

## 2022-05-01 ENCOUNTER — Ambulatory Visit: Payer: BC Managed Care – PPO | Admitting: Physician Assistant

## 2022-05-02 DIAGNOSIS — A749 Chlamydial infection, unspecified: Secondary | ICD-10-CM | POA: Diagnosis not present

## 2022-05-02 DIAGNOSIS — N923 Ovulation bleeding: Secondary | ICD-10-CM | POA: Diagnosis not present

## 2022-05-02 DIAGNOSIS — F4312 Post-traumatic stress disorder, chronic: Secondary | ICD-10-CM | POA: Diagnosis not present

## 2022-05-02 DIAGNOSIS — N898 Other specified noninflammatory disorders of vagina: Secondary | ICD-10-CM | POA: Diagnosis not present

## 2022-05-02 DIAGNOSIS — R3989 Other symptoms and signs involving the genitourinary system: Secondary | ICD-10-CM | POA: Diagnosis not present

## 2022-05-06 DIAGNOSIS — F4312 Post-traumatic stress disorder, chronic: Secondary | ICD-10-CM | POA: Diagnosis not present

## 2022-05-15 DIAGNOSIS — F4312 Post-traumatic stress disorder, chronic: Secondary | ICD-10-CM | POA: Diagnosis not present

## 2022-05-16 ENCOUNTER — Ambulatory Visit: Payer: BC Managed Care – PPO | Admitting: Physician Assistant

## 2022-05-21 DIAGNOSIS — F4312 Post-traumatic stress disorder, chronic: Secondary | ICD-10-CM | POA: Diagnosis not present

## 2022-05-24 ENCOUNTER — Ambulatory Visit: Payer: BC Managed Care – PPO | Admitting: Physician Assistant

## 2022-05-24 ENCOUNTER — Encounter: Payer: Self-pay | Admitting: Physician Assistant

## 2022-05-24 VITALS — BP 106/71 | Temp 97.8°F | Resp 16 | Ht 63.0 in | Wt 125.8 lb

## 2022-05-24 DIAGNOSIS — N926 Irregular menstruation, unspecified: Secondary | ICD-10-CM

## 2022-05-24 DIAGNOSIS — F419 Anxiety disorder, unspecified: Secondary | ICD-10-CM | POA: Diagnosis not present

## 2022-05-24 LAB — POCT URINE PREGNANCY: Preg Test, Ur: NEGATIVE

## 2022-05-24 MED ORDER — ESCITALOPRAM OXALATE 10 MG PO TABS: 10.0000 mg | ORAL_TABLET | Freq: Every day | ORAL | 1 refills | Status: DC

## 2022-05-24 NOTE — Progress Notes (Signed)
Subjective:    Patient ID: Terri Ramirez, female    DOB: 22-Sep-1995, 27 y.o.   MRN: [id number]  Chief Complaint  Patient presents with   Anxiety   Weight Loss    HPI Patient is in today for recheck from previous visit. Eating twice daily, trying to increase her calories lately, still not able to maintain weight well.  She has been donating plasma sometimes, about once per month if most, sometimes needs extra money to get to the next paycheck.   She was comfortable at 140; states that she continues to lose recently though.   Period is about 16 days late. Says she has been very stressed the last month.  Stopped taking Remeron on the 25th of April because she got a puppy and didn't want to be 'knocked out' hard enough to not wake up if she needed her.   Still feeling very anxious throughout the day.    Past Medical History:  Diagnosis Date   Anxiety    Low blood pressure    Low sodium levels     History reviewed. No pertinent surgical history.  Family History  Adopted: Yes  Family history unknown: Yes    Social History   Tobacco Use   Smoking status: Never   Smokeless tobacco: Never  Vaping Use   Vaping Use: Never used  Substance Use Topics   Alcohol use: Not Currently   Drug use: Not Currently    Types: Marijuana     No Known Allergies  Review of Systems NEGATIVE UNLESS OTHERWISE INDICATED IN HPI      Objective:     BP 106/71   Temp 97.8 F (36.6 C) (Temporal)   Resp 16   Ht 5\' 3"  (1.6 Terri)   Wt 125 lb 12.8 oz (57.1 kg)   SpO2 100%   BMI 22.28 kg/Terri   Wt Readings from Last 3 Encounters:  05/24/22 125 lb 12.8 oz (57.1 kg)  04/03/22 137 lb 3.2 oz (62.2 kg)  03/12/22 130 lb 9.6 oz (59.2 kg)    BP Readings from Last 3 Encounters:  05/24/22 106/71  04/03/22 110/64  03/12/22 102/72     Physical Exam Vitals and nursing note reviewed.  Constitutional:      Appearance: Normal appearance.  Cardiovascular:     Rate and Rhythm: Normal rate  and regular rhythm.     Heart sounds: No murmur heard. Pulmonary:     Effort: Pulmonary effort is normal.     Breath sounds: Normal breath sounds.  Skin:    Findings: No lesion or rash.  Neurological:     General: No focal deficit present.     Mental Status: She is alert and oriented to person, place, and time.  Psychiatric:        Mood and Affect: Mood normal.        Behavior: Behavior normal.        Assessment & Plan:  Anxiety Assessment & Plan:    05/24/2022    8:46 AM 03/12/2022    8:21 AM 02/15/2022    9:50 AM 12/09/2018    4:00 PM  GAD 7 : Generalized Anxiety Score  Nervous, Anxious, on Edge 1 0 0 0  Control/stop worrying 3 0 0 0  Worry too much - different things 3 0 0 0  Trouble relaxing 3 0 0 0  Restless 1 0 0 0  Easily annoyed or irritable 0 0 0 0  Afraid - awful might happen 1 0  0 0  Total GAD 7 Score 12 0 0 0  Anxiety Difficulty Very difficult Not difficult at all Not difficult at all     Plan to start on Lexapro 10 mg daily as directed. AVS provided. Pt aware of risks vs benefits and possible adverse reactions. Recheck in 6-8 weeks. She will stop the Remeron.   Orders: -     Escitalopram Oxalate; Take 1 tablet (10 mg total) by mouth daily.  Dispense: 30 tablet; Refill: 1  Menstrual period late -     POCT urine pregnancy  -UPT negative, will monitor at this time; likely stress related.       Return in about 8 weeks (around 07/19/2022) for recheck/follow-up.    Hikaru Delorenzo Terri Mersedes Alber, PA-C

## 2022-05-24 NOTE — Patient Instructions (Signed)
Good to see you today!  Your pregnancy test is negative. Most likely period fluctuation due to underlying stress.  Stop the Remeron. Start on Lexapro 1/2 tab daily x 1 week, then increase to one full tab daily after that. Call if any concerns.

## 2022-05-24 NOTE — Assessment & Plan Note (Signed)
    05/24/2022    8:46 AM 03/12/2022    8:21 AM 02/15/2022    9:50 AM 12/09/2018    4:00 PM  GAD 7 : Generalized Anxiety Score  Nervous, Anxious, on Edge 1 0 0 0  Control/stop worrying 3 0 0 0  Worry too much - different things 3 0 0 0  Trouble relaxing 3 0 0 0  Restless 1 0 0 0  Easily annoyed or irritable 0 0 0 0  Afraid - awful might happen 1 0 0 0  Total GAD 7 Score 12 0 0 0  Anxiety Difficulty Very difficult Not difficult at all Not difficult at all     Plan to start on Lexapro 10 mg daily as directed. AVS provided. Pt aware of risks vs benefits and possible adverse reactions. Recheck in 6-8 weeks. She will stop the Remeron.

## 2022-05-30 DIAGNOSIS — F4312 Post-traumatic stress disorder, chronic: Secondary | ICD-10-CM | POA: Diagnosis not present

## 2022-06-07 DIAGNOSIS — F4312 Post-traumatic stress disorder, chronic: Secondary | ICD-10-CM | POA: Diagnosis not present

## 2022-06-12 DIAGNOSIS — F4312 Post-traumatic stress disorder, chronic: Secondary | ICD-10-CM | POA: Diagnosis not present

## 2022-06-16 ENCOUNTER — Emergency Department (HOSPITAL_COMMUNITY): Payer: BC Managed Care – PPO

## 2022-06-16 ENCOUNTER — Emergency Department (HOSPITAL_COMMUNITY)
Admission: EM | Admit: 2022-06-16 | Discharge: 2022-06-16 | Disposition: A | Discharge status: Home / Self Care | Payer: BC Managed Care – PPO | Attending: Emergency Medicine | Admitting: Emergency Medicine

## 2022-06-16 ENCOUNTER — Encounter (HOSPITAL_COMMUNITY): Payer: Self-pay

## 2022-06-16 ENCOUNTER — Other Ambulatory Visit: Payer: Self-pay

## 2022-06-16 DIAGNOSIS — S93401A Sprain of unspecified ligament of right ankle, initial encounter: Secondary | ICD-10-CM | POA: Diagnosis not present

## 2022-06-16 DIAGNOSIS — S93491A Sprain of other ligament of right ankle, initial encounter: Secondary | ICD-10-CM | POA: Diagnosis not present

## 2022-06-16 DIAGNOSIS — S99911A Unspecified injury of right ankle, initial encounter: Secondary | ICD-10-CM | POA: Diagnosis not present

## 2022-06-16 DIAGNOSIS — W172XXA Fall into hole, initial encounter: Secondary | ICD-10-CM | POA: Insufficient documentation

## 2022-06-16 DIAGNOSIS — M7989 Other specified soft tissue disorders: Secondary | ICD-10-CM | POA: Diagnosis not present

## 2022-06-16 MED ORDER — IBUPROFEN 800 MG PO TABS: 800.0000 mg | ORAL_TABLET | Freq: Three times a day (TID) | ORAL | 0 refills | Status: DC | PRN

## 2022-06-16 MED ORDER — IBUPROFEN 800 MG PO TABS
800.0000 mg | ORAL_TABLET | Freq: Once | ORAL | Status: AC
  Administered 2022-06-16: 800 mg via ORAL
  Filled 2022-06-16: qty 1

## 2022-06-16 NOTE — ED Triage Notes (Addendum)
Pt reports with right ankle pain after falling into a ditch last night. Pt has swelling and is unable to walk on it without limping. Pt states that she passed out when she fell.

## 2022-06-16 NOTE — ED Provider Notes (Signed)
[place] EMERGENCY DEPARTMENT AT [facility] Provider Note   CSN: [id number] Arrival date & time: 06/16/22  0555     History  Chief Complaint  Patient presents with   Ankle Pain    Terri Ramirez is a 27 y.o. female.  The history is provided by the patient. No language interpreter was used.  Ankle Pain Location:  Ankle Time since incident:  1 hour Injury: no   Ankle location:  R ankle Pain details:    Quality:  Aching   Radiates to:  Does not radiate   Severity:  No pain   Timing:  Constant   Progression:  Worsening Chronicity:  New Dislocation: no   Relieved by:  Nothing Worsened by:  Nothing Ineffective treatments:  None tried Associated symptoms: swelling        Home Medications Prior to Admission medications   Medication Sig Start Date End Date Taking? Authorizing Provider  ibuprofen (ADVIL) 800 MG tablet Take 1 tablet (800 mg total) by mouth every 8 (eight) hours as needed. 06/16/22  Yes Cheron Schaumann K, PA-C  escitalopram (LEXAPRO) 10 MG tablet Take 1 tablet (10 mg total) by mouth daily. 05/24/22   Allwardt, Crist Infante, PA-C  valACYclovir (VALTREX) 1000 MG tablet Take 1 g once daily for 5 days 04/03/22   Allwardt, Crist Infante, PA-C      Allergies    Patient has no known allergies.    Review of Systems   Review of Systems  All other systems reviewed and are negative.   Physical Exam Updated Vital Signs BP 132/80   Pulse 80   Temp 98.6 F (37 C) (Oral)   Resp 19   Ht 5\' 3"  (1.6 m)   Wt 56.7 kg   SpO2 100%   BMI 22.14 kg/m  Physical Exam Vitals reviewed.  Constitutional:      Appearance: Normal appearance.  Musculoskeletal:        General: Swelling and tenderness present.     Comments: Swollen right ankle  decreased range of motion  nv and ns intact   Skin:    General: Skin is warm.  Neurological:     General: No focal deficit present.     Mental Status: She is alert.  Psychiatric:        Mood and Affect: Mood normal.     ED  Results / Procedures / Treatments   Labs (all labs ordered are listed, but only abnormal results are displayed) Labs Reviewed - No data to display  EKG None  Radiology DG Ankle Complete Right  Result Date: 06/16/2022 CLINICAL DATA:  27 year old female status post fall in ditch yesterday with pain and lateral swelling. EXAM: RIGHT ANKLE - COMPLETE 3+ VIEW COMPARISON:  None Available. FINDINGS: Bone mineralization is within normal limits. Maintained mortise joint alignment. No evidence of joint effusion. Talar dome intact. Asymmetric mild to moderate lateral soft tissue swelling. No osseous abnormality identified. IMPRESSION: Lateral soft tissue swelling with no fracture or dislocation identified about the right ankle. Electronically Signed   By: Odessa Fleming M.D.   On: 06/16/2022 06:50    Procedures Procedures    Medications Ordered in ED Medications  ibuprofen (ADVIL) tablet 800 mg (800 mg Oral Given 06/16/22 4098)    ED Course/ Medical Decision Making/ A&P                             Medical Decision Making Pt turned her  ankle last pm.  Pt stepped in a ditch   Amount and/or Complexity of Data Reviewed Radiology: ordered and independent interpretation performed. Decision-making details documented in ED Course.    Details: Xray ankle  no fx, soft tissue swelling   Risk Prescription drug management. Risk Details: Pt given rx for ibuprofen, placed in aso and given crutches            Final Clinical Impression(s) / ED Diagnoses Final diagnoses:  Sprain of right ankle, unspecified ligament, initial encounter    Rx / DC Orders ED Discharge Orders          Ordered    ibuprofen (ADVIL) 800 MG tablet  Every 8 hours PRN        06/16/22 0708           An After Visit Summary was printed and given to the patient.    Elson Areas, PA-C 06/16/22 0730    Pricilla Loveless, MD 06/18/22 [phone]

## 2022-06-16 NOTE — Discharge Instructions (Addendum)
Return if any problems.

## 2022-06-17 ENCOUNTER — Other Ambulatory Visit: Payer: Self-pay | Admitting: Physician Assistant

## 2022-06-17 DIAGNOSIS — F4312 Post-traumatic stress disorder, chronic: Secondary | ICD-10-CM | POA: Diagnosis not present

## 2022-06-17 DIAGNOSIS — F419 Anxiety disorder, unspecified: Secondary | ICD-10-CM

## 2022-06-20 ENCOUNTER — Ambulatory Visit (INDEPENDENT_AMBULATORY_CARE_PROVIDER_SITE_OTHER): Payer: BC Managed Care – PPO | Admitting: Family Medicine

## 2022-06-20 ENCOUNTER — Ambulatory Visit (INDEPENDENT_AMBULATORY_CARE_PROVIDER_SITE_OTHER): Payer: BC Managed Care – PPO | Admitting: Physician Assistant

## 2022-06-20 VITALS — BP 116/74 | HR 73 | Temp 97.7°F | Ht 63.0 in | Wt 127.0 lb

## 2022-06-20 VITALS — BP 116/74 | HR 73 | Ht 63.0 in | Wt 127.0 lb

## 2022-06-20 DIAGNOSIS — S93401D Sprain of unspecified ligament of right ankle, subsequent encounter: Secondary | ICD-10-CM | POA: Diagnosis not present

## 2022-06-20 DIAGNOSIS — M25571 Pain in right ankle and joints of right foot: Secondary | ICD-10-CM | POA: Diagnosis not present

## 2022-06-20 NOTE — Patient Instructions (Addendum)
Thank you for coming in today.   Lookup [facility] or [facility] self pay proices. It may be less than [employer].   Please go to [facility] supply to get the CAM Henrietta Dine we talked about today. You may also be able to get it from [employer].    Please complete the exercises that the athletic trainer went over with you:  View at [email or web address] using code: [email or web address]  I've referred you to Physical Therapy.  Let us know if you don't hear from them in one week.   Check back in 2-4 weeks

## 2022-06-20 NOTE — Progress Notes (Signed)
   Rubin Payor, PhD, LAT, ATC acting as a scribe for Clementeen Graham, MD.  Terri Ramirez is a 27 y.o. female who presents to [facility] at [facility] today for R ankle pain ongoing since June 2nd. Pt was seen at the [facility] ED after the injury. She was in her backyard and stepping into a hole. She thinks her ankle rolled into INV. She does not have a specific painful spot, but any movement is extremely painful. This is the 1st injury she's ever sustained.  She is hoping to return to work on Monday.  She works in a [employer].  R ankle swelling: yes Treatments tried: ankle brace,   Dx imaging: 06/16/22 R ankle XR  Pertinent review of systems: No fevers or chills  Relevant historical information: Anxiety   Exam:  BP 116/74   Pulse 73   Ht 5\' 3"  (1.6 m)   Wt 127 lb (57.6 kg)   LMP 05/27/2022   SpO2 98%   BMI 22.50 kg/m  General: Well Developed, well nourished, and in no acute distress.   MSK: Right ankle swelling and ATFL.  Tender palpation ATFL.  Decreased range of motion.  Strength difficult to assess patient is reluctant to move her ankle. Inadequate stability exam testing too much guarding nondiagnostic. Pulses cap refill and sensation are intact distally.   Lab and Radiology Results  DG Ankle Complete Right  Result Date: 06/16/2022 CLINICAL DATA:  27 year old female status post fall in ditch yesterday with pain and lateral swelling. EXAM: RIGHT ANKLE - COMPLETE 3+ VIEW COMPARISON:  None Available. FINDINGS: Bone mineralization is within normal limits. Maintained mortise joint alignment. No evidence of joint effusion. Talar dome intact. Asymmetric mild to moderate lateral soft tissue swelling. No osseous abnormality identified. IMPRESSION: Lateral soft tissue swelling with no fracture or dislocation identified about the right ankle. Electronically Signed   By: Odessa Fleming M.D.   On: 06/16/2022 06:50   I, Clementeen Graham, personally (independently) visualized and  performed the interpretation of the images attached in this note.      Assessment and Plan: 27 y.o. female with right ankle sprain.  She is having severe symptoms.  Recommend ASO ankle brace range of motion home exercise program and physical therapy.  Ibuprofen and Tylenol for pain control.  She is hoping to return to work in a few days which is can be challenging as she cannot walk normally right now.  Can modify work note if needed.  Try to limit work duties with work note today.  Recheck in 2 to 4 weeks.   PDMP not reviewed this encounter. Orders Placed This Encounter  Procedures   Ambulatory referral to Physical Therapy    Referral Priority:   Routine    Referral Type:   Physical Medicine    Referral Reason:   Specialty Services Required    Requested Specialty:   Physical Therapy    Number of Visits Requested:   1   No orders of the defined types were placed in this encounter.    Discussed warning signs or symptoms. Please see discharge instructions. Patient expresses understanding.   The above documentation has been reviewed and is accurate and complete Clementeen Graham, M.D.

## 2022-06-20 NOTE — Progress Notes (Signed)
   Subjective:    Patient ID: Jodene Mandez, female    DOB: 11/15/1995, 27 y.o.   MRN: [id number]  Chief Complaint  Patient presents with   Follow-up    Pt in office for ED follow up; pt injured right ankle when steeping into hole in back yard covered in grass; no breaks per Xray and ED note; pt also c/o injuring left knee hitting corner of table yesterday;     HPI Patient is in today for right ankle sprain, ED f/up.  DOI: 06/16/22; walking her puppy; felt a pop along lateral R ankle. XRAY negative at ED. Has not been doing ice or ibuprofen. Pain level still high where she doesn't want to move her ankle. Wearing a brace, but not using the crutches she was given.   Past Medical History:  Diagnosis Date   Anxiety    Low blood pressure    Low sodium levels     No past surgical history on file.  Family History  Adopted: Yes  Family history unknown: Yes    Social History   Tobacco Use   Smoking status: Never   Smokeless tobacco: Never  Vaping Use   Vaping Use: Never used  Substance Use Topics   Alcohol use: Not Currently   Drug use: Not Currently    Types: Marijuana     No Known Allergies  Review of Systems NEGATIVE UNLESS OTHERWISE INDICATED IN HPI      Objective:     BP 116/74 (BP Location: Left Arm)   Pulse 73   Temp 97.7 F (36.5 C) (Temporal)   Ht 5\' 3"  (1.6 m)   Wt 127 lb (57.6 kg)   LMP 05/27/2022   SpO2 98%   BMI 22.50 kg/m   Wt Readings from Last 3 Encounters:  06/20/22 127 lb (57.6 kg)  06/16/22 125 lb (56.7 kg)  05/24/22 125 lb 12.8 oz (57.1 kg)    BP Readings from Last 3 Encounters:  06/20/22 116/74  06/16/22 132/80  05/24/22 106/71     Physical Exam Vitals and nursing note reviewed.  Constitutional:      Appearance: Normal appearance.  Musculoskeletal:     Right ankle: Swelling and ecchymosis present. Tenderness present over the lateral malleolus and ATF ligament. Decreased range of motion. Normal pulse.     Comments: N/V intact  R ankle  Neurological:     Mental Status: She is alert.        Assessment & Plan:  Sprain of right ankle, unspecified ligament, subsequent encounter -     Ambulatory referral to Sports Medicine   -Reviewed XRAY from ED, no fracture -Will have her follow with sports med -Strongly encouraged her to start the ibuprofen regimen, REST, and ice; she should be using crutches.  -OOW note provided.      Return if symptoms worsen or fail to improve.     Treysean Petruzzi M Reginal Wojcicki, PA-C

## 2022-06-20 NOTE — Patient Instructions (Signed)
Please call sports med for appointment: Address: [address], [place], [place] [address] Phone: [phone]  Take ibuprofen with food as directed.  Ice 15 minutes at least 3 times per day. Elevate above heart level when resting.  Try to stay off this ankle as much as possible.

## 2022-07-01 DIAGNOSIS — F4312 Post-traumatic stress disorder, chronic: Secondary | ICD-10-CM | POA: Diagnosis not present

## 2022-07-05 ENCOUNTER — Ambulatory Visit: Payer: BC Managed Care – PPO | Admitting: Physical Therapy

## 2022-07-08 ENCOUNTER — Telehealth: Payer: Self-pay | Admitting: Physician Assistant

## 2022-07-08 NOTE — Telephone Encounter (Signed)
Called and spoke with patient informed her that insurance will not cover this because this an otc item, pt said that she could afford it and wanted what she sure do I advised pt that she check with different pharmacies to price check or check with [facility] to see it  there it any help. Patient stated that she will  call around to see.

## 2022-07-08 NOTE — Telephone Encounter (Signed)
Patient called stating she is in need of emergency contraceptive, Plan B. States she was active on 6/22 and 6/23. States pharmacy informed her PCP needs to send rx order for medication before they can fill for her.

## 2022-07-10 DIAGNOSIS — F4312 Post-traumatic stress disorder, chronic: Secondary | ICD-10-CM | POA: Diagnosis not present

## 2022-07-11 ENCOUNTER — Ambulatory Visit: Payer: BC Managed Care – PPO | Admitting: Family Medicine

## 2022-07-12 ENCOUNTER — Encounter: Payer: Self-pay | Admitting: Physician Assistant

## 2022-07-16 DIAGNOSIS — F4312 Post-traumatic stress disorder, chronic: Secondary | ICD-10-CM | POA: Diagnosis not present

## 2022-07-19 ENCOUNTER — Ambulatory Visit: Payer: BC Managed Care – PPO | Admitting: Physician Assistant

## 2022-07-24 ENCOUNTER — Ambulatory Visit: Payer: BC Managed Care – PPO | Admitting: Physician Assistant

## 2022-07-30 DIAGNOSIS — F4312 Post-traumatic stress disorder, chronic: Secondary | ICD-10-CM | POA: Diagnosis not present

## 2022-08-05 ENCOUNTER — Encounter: Payer: Self-pay | Admitting: Physician Assistant

## 2022-08-05 ENCOUNTER — Ambulatory Visit: Payer: BC Managed Care – PPO | Admitting: Physician Assistant

## 2022-08-05 VITALS — BP 128/68 | HR 77 | Temp 97.8°F | Wt 123.6 lb

## 2022-08-05 DIAGNOSIS — N926 Irregular menstruation, unspecified: Secondary | ICD-10-CM

## 2022-08-05 DIAGNOSIS — R110 Nausea: Secondary | ICD-10-CM

## 2022-08-05 DIAGNOSIS — Z3201 Encounter for pregnancy test, result positive: Secondary | ICD-10-CM

## 2022-08-05 LAB — POCT URINE PREGNANCY: Preg Test, Ur: POSITIVE — AB

## 2022-08-05 NOTE — Progress Notes (Signed)
   Subjective:    Patient ID: Terri Ramirez, female    DOB: December 08, 1995, 27 y.o.   MRN: [id number]  Chief Complaint  Patient presents with   Missed menstrual cycle     Possible pregnancy    medication mangement     Nauseous and headache for 2 weeks     HPI Patient is in today for possible pregnancy. Took three pregnancy tests last night, all positive.  Patient's last menstrual period was 06/28/2022. Usually has irregular cycles.  Some cramping, breast sensitivity, very nauseated and some headaches.  Scheduled with OB/GYN August 8th.   Past Medical History:  Diagnosis Date   Anxiety    Low blood pressure    Low sodium levels     No past surgical history on file.  Family History  Adopted: Yes  Family history unknown: Yes    Social History   Tobacco Use   Smoking status: Never   Smokeless tobacco: Never  Vaping Use   Vaping status: Never Used  Substance Use Topics   Alcohol use: Not Currently   Drug use: Not Currently    Types: Marijuana     No Known Allergies  Review of Systems NEGATIVE UNLESS OTHERWISE INDICATED IN HPI      Objective:     BP 128/68   Pulse 77   Temp 97.8 F (36.6 C) (Temporal)   Wt 123 lb 9.6 oz (56.1 kg)   LMP 06/28/2022   SpO2 100%   BMI 21.89 kg/m   Wt Readings from Last 3 Encounters:  08/05/22 123 lb 9.6 oz (56.1 kg)  06/20/22 127 lb (57.6 kg)  06/20/22 127 lb (57.6 kg)    BP Readings from Last 3 Encounters:  08/05/22 128/68  06/20/22 116/74  06/20/22 116/74     Physical Exam Vitals and nursing note reviewed.  Constitutional:      Appearance: Normal appearance.  Cardiovascular:     Rate and Rhythm: Normal rate and regular rhythm.  Pulmonary:     Effort: Pulmonary effort is normal.     Breath sounds: Normal breath sounds.  Abdominal:     Palpations: Abdomen is soft.  Neurological:     Mental Status: She is alert.  Psychiatric:        Mood and Affect: Affect is tearful.        Assessment & Plan:   Missed period -     POCT urine pregnancy  Positive pregnancy test   -POC urine pregnancy test positive in office. She will follow with OB/GYN. Initially told me LMP was 5/17, rechecked her app and it was Patient's last menstrual period was 06/28/2022. This puts her close to [redacted] weeks pregnant, however her cycles are irregular. -No red flags on exam.  -Start on B6 twice daily, Unisom at bedtime to help with nausea. -She will stay on Lexapro   ER if any severe abdominal pain, fever, bleeding, etc.   Return if symptoms worsen or fail to improve.   Ksenia Kunz M Lennyn Bellanca, PA-C

## 2022-08-05 NOTE — Patient Instructions (Signed)
Start on B6 twice daily, Unisom at bedtime to help with nausea.  Call OB/GYN - you are about 9 weeks and 3 days  ER if any severe abdominal pain, fever, bleeding, etc.

## 2022-08-06 ENCOUNTER — Telehealth: Payer: Self-pay | Admitting: Physician Assistant

## 2022-08-06 NOTE — Telephone Encounter (Addendum)
Called and left patient a voice message asking to give our office a call

## 2022-08-06 NOTE — Telephone Encounter (Signed)
Pt would like a call back about a question regarding a letter for pt to have an animal at her apartment. Please advise.

## 2022-08-07 DIAGNOSIS — F4312 Post-traumatic stress disorder, chronic: Secondary | ICD-10-CM | POA: Diagnosis not present

## 2022-08-07 NOTE — Telephone Encounter (Signed)
Noted - will need psychology to complete this type of letter. Thanks

## 2022-08-12 DIAGNOSIS — F4312 Post-traumatic stress disorder, chronic: Secondary | ICD-10-CM | POA: Diagnosis not present

## 2022-08-15 ENCOUNTER — Encounter: Payer: Self-pay | Admitting: Physician Assistant

## 2022-08-15 NOTE — Telephone Encounter (Signed)
Patient is scheduled with Dulce Sellar 08/16/22

## 2022-08-16 ENCOUNTER — Ambulatory Visit: Payer: BC Managed Care – PPO | Admitting: Physician Assistant

## 2022-08-16 ENCOUNTER — Other Ambulatory Visit (HOSPITAL_COMMUNITY)
Admission: RE | Admit: 2022-08-16 | Discharge: 2022-08-16 | Disposition: A | Discharge status: Home / Self Care | Payer: BC Managed Care – PPO | Source: Ambulatory Visit | Attending: Family | Admitting: Family

## 2022-08-16 ENCOUNTER — Encounter: Payer: Self-pay | Admitting: Physician Assistant

## 2022-08-16 VITALS — BP 107/71 | HR 79 | Temp 97.3°F | Ht 63.0 in | Wt 123.2 lb

## 2022-08-16 DIAGNOSIS — Z3491 Encounter for supervision of normal pregnancy, unspecified, first trimester: Secondary | ICD-10-CM | POA: Diagnosis not present

## 2022-08-16 DIAGNOSIS — N760 Acute vaginitis: Secondary | ICD-10-CM | POA: Insufficient documentation

## 2022-08-16 MED ORDER — MICONAZOLE NITRATE 200 MG VA SUPP: 200.0000 mg | Freq: Every day | VAGINAL | 0 refills | Status: DC

## 2022-08-16 NOTE — Progress Notes (Signed)
   Subjective:    Patient ID: Terri Ramirez, female    DOB: 02-11-1995, 27 y.o.   MRN: [id number]  Chief Complaint  Patient presents with   Vaginal Itching    Burning with urination, discharge (white) no odor  Sx stared 5-6 days ago     Vaginal Itching   Patient is in today for vaginal discharge x 5-6 days.  Some burning pain around vaginal region. White, clumpy discharge, very itchy. No bleeding. No abdomen or back pain. No fever or chills.   First GYN appointment is next week on 8/8 - with Bradly Bienenstock at [facility]  Taking a prenatal daily. Also using dramamine to help with nausea.   Past Medical History:  Diagnosis Date   Anxiety    Low blood pressure    Low sodium levels     No past surgical history on file.  Family History  Adopted: Yes  Family history unknown: Yes    Social History   Tobacco Use   Smoking status: Never   Smokeless tobacco: Never  Vaping Use   Vaping status: Never Used  Substance Use Topics   Alcohol use: Not Currently   Drug use: Not Currently    Types: Marijuana     No Known Allergies  Review of Systems NEGATIVE UNLESS OTHERWISE INDICATED IN HPI      Objective:     BP 107/71   Pulse 79   Temp (!) 97.3 F (36.3 C) (Temporal)   Ht 5\' 3"  (1.6 m)   Wt 123 lb 3.2 oz (55.9 kg)   LMP 06/28/2022   SpO2 100%   BMI 21.82 kg/m   Wt Readings from Last 3 Encounters:  08/16/22 123 lb 3.2 oz (55.9 kg)  08/05/22 123 lb 9.6 oz (56.1 kg)  06/20/22 127 lb (57.6 kg)    BP Readings from Last 3 Encounters:  08/16/22 107/71  08/05/22 128/68  06/20/22 116/74     Physical Exam Vitals and nursing note reviewed.  Constitutional:      Appearance: Normal appearance.  Cardiovascular:     Rate and Rhythm: Normal rate and regular rhythm.  Pulmonary:     Effort: Pulmonary effort is normal.     Breath sounds: Normal breath sounds.  Abdominal:     General: Abdomen is flat.     Palpations: Abdomen is soft.  Neurological:     Mental  Status: She is alert.  Psychiatric:        Mood and Affect: Mood normal.        Assessment & Plan:  Acute vaginitis -     Cervicovaginal ancillary only -     Miconazole Nitrate; Place 1 suppository (200 mg total) vaginally at bedtime.  Dispense: 3 suppository; Refill: 0  Pregnant and not yet delivered in first trimester -     Cervicovaginal ancillary only    Suspect vaginal yeast infection. Treat with monistat suppository x 3 days as directed. Swab sent out, change treatment plan prn results. Pt to continue prenatal care. She will call if any concerns.   F/up prn    Cyruss Arata M Deriana Vanderhoef, PA-C

## 2022-08-16 NOTE — Progress Notes (Signed)
   Patient ID: Terri Ramirez, female    DOB: Mar 09, 1995, 27 y.o.   MRN: [id number]  No chief complaint on file.   HPI:           Assessment & Plan:     Subjective:    Outpatient Medications Prior to Visit  Medication Sig Dispense Refill   escitalopram (LEXAPRO) 10 MG tablet TAKE 1 TABLET BY MOUTH EVERY DAY 90 tablet 1   No facility-administered medications prior to visit.   Past Medical History:  Diagnosis Date   Anxiety    Low blood pressure    Low sodium levels    No past surgical history on file. No Known Allergies    Objective:    Physical Exam Vitals and nursing note reviewed.  Constitutional:      Appearance: Normal appearance.  Cardiovascular:     Rate and Rhythm: Normal rate and regular rhythm.  Pulmonary:     Effort: Pulmonary effort is normal.     Breath sounds: Normal breath sounds.  Musculoskeletal:        General: Normal range of motion.  Skin:    General: Skin is warm and dry.  Neurological:     Mental Status: She is alert.  Psychiatric:        Mood and Affect: Mood normal.        Behavior: Behavior normal.    LMP 06/28/2022  Wt Readings from Last 3 Encounters:  08/05/22 123 lb 9.6 oz (56.1 kg)  06/20/22 127 lb (57.6 kg)  06/20/22 127 lb (57.6 kg)       Dulce Sellar, NP

## 2022-08-17 ENCOUNTER — Encounter: Payer: Self-pay | Admitting: Physician Assistant

## 2022-08-22 DIAGNOSIS — F4312 Post-traumatic stress disorder, chronic: Secondary | ICD-10-CM | POA: Diagnosis not present

## 2022-08-22 DIAGNOSIS — Z3400 Encounter for supervision of normal first pregnancy, unspecified trimester: Secondary | ICD-10-CM | POA: Diagnosis not present

## 2022-08-26 DIAGNOSIS — O26891 Other specified pregnancy related conditions, first trimester: Secondary | ICD-10-CM | POA: Diagnosis not present

## 2022-08-27 DIAGNOSIS — F4312 Post-traumatic stress disorder, chronic: Secondary | ICD-10-CM | POA: Diagnosis not present

## 2022-08-28 ENCOUNTER — Other Ambulatory Visit: Payer: Self-pay

## 2022-08-28 ENCOUNTER — Emergency Department (HOSPITAL_COMMUNITY)
Admission: EM | Admit: 2022-08-28 | Discharge: 2022-08-28 | Disposition: A | Discharge status: Home / Self Care | Payer: BC Managed Care – PPO | Attending: Emergency Medicine | Admitting: Emergency Medicine

## 2022-08-28 ENCOUNTER — Encounter (HOSPITAL_COMMUNITY): Payer: Self-pay

## 2022-08-28 DIAGNOSIS — O219 Vomiting of pregnancy, unspecified: Secondary | ICD-10-CM | POA: Diagnosis not present

## 2022-08-28 DIAGNOSIS — Z3A01 Less than 8 weeks gestation of pregnancy: Secondary | ICD-10-CM | POA: Diagnosis not present

## 2022-08-28 LAB — CBC WITH DIFFERENTIAL/PLATELET
Abs Immature Granulocytes: 0.03 10*3/uL (ref 0.00–0.07)
Basophils Absolute: 0 10*3/uL (ref 0.0–0.1)
Basophils Relative: 1 %
Eosinophils Absolute: 0.1 10*3/uL (ref 0.0–0.5)
Eosinophils Relative: 2 %
HCT: 37.2 % (ref 36.0–46.0)
Hemoglobin: 12.8 g/dL (ref 12.0–15.0)
Immature Granulocytes: 0 %
Lymphocytes Relative: 24 %
Lymphs Abs: 1.8 10*3/uL (ref 0.7–4.0)
MCH: 32.2 pg (ref 26.0–34.0)
MCHC: 34.4 g/dL (ref 30.0–36.0)
MCV: 93.5 fL (ref 80.0–100.0)
Monocytes Absolute: 0.5 10*3/uL (ref 0.1–1.0)
Monocytes Relative: 6 %
Neutro Abs: 5.1 10*3/uL (ref 1.7–7.7)
Neutrophils Relative %: 67 %
Platelets: 224 10*3/uL (ref 150–400)
RBC: 3.98 MIL/uL (ref 3.87–5.11)
RDW: 12 % (ref 11.5–15.5)
WBC: 7.6 10*3/uL (ref 4.0–10.5)
nRBC: 0 % (ref 0.0–0.2)

## 2022-08-28 LAB — URINALYSIS, ROUTINE W REFLEX MICROSCOPIC
Bilirubin Urine: NEGATIVE
Glucose, UA: NEGATIVE mg/dL
Hgb urine dipstick: NEGATIVE
Ketones, ur: NEGATIVE mg/dL
Leukocytes,Ua: NEGATIVE
Nitrite: NEGATIVE
Protein, ur: NEGATIVE mg/dL
Specific Gravity, Urine: 1.014 (ref 1.005–1.030)
pH: 7 (ref 5.0–8.0)

## 2022-08-28 LAB — COMPREHENSIVE METABOLIC PANEL
ALT: 28 U/L (ref 0–44)
AST: 23 U/L (ref 15–41)
Albumin: 3.7 g/dL (ref 3.5–5.0)
Alkaline Phosphatase: 50 U/L (ref 38–126)
Anion gap: 8 (ref 5–15)
BUN: 6 mg/dL (ref 6–20)
CO2: 22 mmol/L (ref 22–32)
Calcium: 9.1 mg/dL (ref 8.9–10.3)
Chloride: 102 mmol/L (ref 98–111)
Creatinine, Ser: 0.31 mg/dL — ABNORMAL LOW (ref 0.44–1.00)
GFR, Estimated: >60 mL/min (ref >60)
Glucose, Bld: 117 mg/dL — ABNORMAL HIGH (ref 70–99)
Potassium: 3.8 mmol/L (ref 3.5–5.1)
Sodium: 132 mmol/L — ABNORMAL LOW (ref 135–145)
Total Bilirubin: 0.8 mg/dL (ref 0.3–1.2)
Total Protein: 6.7 g/dL (ref 6.5–8.1)

## 2022-08-28 MED ORDER — LACTATED RINGERS IV BOLUS
1000.0000 mL | Freq: Once | INTRAVENOUS | Status: AC
  Administered 2022-08-28: 1000 mL via INTRAVENOUS

## 2022-08-28 MED ORDER — METOCLOPRAMIDE HCL 5 MG/ML IJ SOLN
10.0000 mg | Freq: Once | INTRAMUSCULAR | Status: AC
  Administered 2022-08-28: 10 mg via INTRAVENOUS
  Filled 2022-08-28: qty 2

## 2022-08-28 NOTE — Discharge Instructions (Addendum)
We discussed obtaining over-the-counter medications such as Pyridoxine (vitamin B6)*  10 to 25 mg orally every 6 to 8 hours, PRN Where the 10 mg tablet is unavailable, the 25 mg tablet can be used safely (splitting the tablet can be difficult).  In addition you will need to buy Unisom (doxylamine) please take this only at night 25 mg or half of the tablet.  Please continue with small snacks throughout the day, in order to avoid your stomach to be fully empty.  Follow-up with your OB/GYN as needed.

## 2022-08-28 NOTE — ED Triage Notes (Signed)
Pt coming in complaining of dehydration and emesis. Pt states for the last 1.5 wks she has had excessive emesis episodes, and struggling to keep anything down. Hx GI issues, unsure what she was dx with in the past. Pt is also [redacted] wks pregnant. Denies any blood in emesis.

## 2022-08-28 NOTE — ED Notes (Signed)
Pt unable to give urine sample at this time. Ice chips given and will ask again later.

## 2022-08-28 NOTE — ED Provider Notes (Signed)
[place] EMERGENCY DEPARTMENT AT [facility] Provider Note   CSN: [id number] Arrival date & time: 08/28/22  0844     History  Chief Complaint  Patient presents with   Emesis    Terri Ramirez is a 27 y.o. female.  27 year old female G1, P0 currently [redacted] weeks pregnant presents to the ED with a chief complaint of emesis and vomiting for the past 1-1/2 weeks.  Patient reports she began to feel nauseous around 6 weeks, states that the past week has been harder to keep anything down.  She has not even been able to tolerate liquids.  She feels like she cannot work, she is too weak to do so.  She is currently followed by one of the primary practices but is switching to [employer].  She has been trying to wake up in the middle the night have a saltine cracker but there has not been any improvement.  She did trial with B6 along reports that this made her nausea worse.  She denies any vaginal bleeding, lower abdominal cramping, blood in her emesis.   The history is provided by the patient.  Emesis Associated symptoms: abdominal pain   Associated symptoms: no chills, no diarrhea and no fever        Home Medications Prior to Admission medications   Medication Sig Start Date End Date Taking? Authorizing Provider  escitalopram (LEXAPRO) 10 MG tablet TAKE 1 TABLET BY MOUTH EVERY DAY 06/18/22   Allwardt, Alyssa M, PA-C  miconazole (MICOTIN) 200 MG vaginal suppository Place 1 suppository (200 mg total) vaginally at bedtime. 08/16/22   Allwardt, Crist Infante, PA-C      Allergies    Patient has no known allergies.    Review of Systems   Review of Systems  Constitutional:  Negative for chills and fever.  Respiratory:  Negative for shortness of breath.   Cardiovascular:  Negative for chest pain.  Gastrointestinal:  Positive for abdominal pain, nausea and vomiting. Negative for diarrhea.  Genitourinary:  Negative for difficulty urinating and flank pain.  All other systems reviewed  and are negative.   Physical Exam Updated Vital Signs BP 104/63   Pulse 61   Temp 98.6 F (37 C) (Oral)   Resp 16   Ht 5\' 3"  (1.6 m)   Wt 55.9 kg   LMP 06/28/2022 (Approximate)   SpO2 100%   BMI 21.82 kg/m  Physical Exam Vitals and nursing note reviewed.  Constitutional:      General: She is not in acute distress.    Appearance: She is well-developed.  HENT:     Head: Normocephalic and atraumatic.     Mouth/Throat:     Pharynx: No oropharyngeal exudate.  Eyes:     Pupils: Pupils are equal, round, and reactive to light.  Cardiovascular:     Rate and Rhythm: Regular rhythm.     Heart sounds: Normal heart sounds.  Pulmonary:     Effort: Pulmonary effort is normal. No respiratory distress.     Breath sounds: Normal breath sounds.  Abdominal:     General: Bowel sounds are normal. There is no distension.     Palpations: Abdomen is soft.     Tenderness: There is no abdominal tenderness.  Musculoskeletal:        General: No tenderness or deformity.     Cervical back: Normal range of motion.     Right lower leg: No edema.     Left lower leg: No edema.  Skin:  General: Skin is warm and dry.  Neurological:     Mental Status: She is alert and oriented to person, place, and time.     ED Results / Procedures / Treatments   Labs (all labs ordered are listed, but only abnormal results are displayed) Labs Reviewed  COMPREHENSIVE METABOLIC PANEL - Abnormal; Notable for the following components:      Result Value   Sodium 132 (*)    Glucose, Bld 117 (*)    Creatinine, Ser 0.31 (*)    All other components within normal limits  URINALYSIS, ROUTINE W REFLEX MICROSCOPIC - Abnormal; Notable for the following components:   APPearance HAZY (*)    All other components within normal limits  CBC WITH DIFFERENTIAL/PLATELET    EKG None  Radiology No results found.  Procedures Procedures    Medications Ordered in ED Medications  metoCLOPramide (REGLAN) injection 10 mg  (10 mg Intravenous Given 08/28/22 0943)  lactated ringers bolus 1,000 mL (1,000 mLs Intravenous New Bag/Given 08/28/22 1610)    ED Course/ Medical Decision Making/ A&P                                 Medical Decision Making Amount and/or Complexity of Data Reviewed Labs: ordered.  Risk Prescription drug management.   Patient presents to the ED with a chief complaint of emesis and nausea that is been ongoing for the past week and a half, reports this began after her 6 weeks of pregnancy.  She did have a prior ultrasound which confirmed an IUP.  She has been followed previously by our primary care, but reports she is now going to be followed by [facility].  She is not having any abdominal cramping, no vaginal bleeding, no gush of fluid.  She reports this is her first pregnancy, has tried some over-the-counter medication and has been taking Dramamine for her nausea without any improvement in symptoms.  On arrival patient is hemodynamically stable with stable vital signs, not actively vomiting at this time.  Her abdomen is soft, nondistended.  Lungs are clear to auscultation.  Oropharynx is clear, no recent sick contacts.  We discussed obtaining labs, hydration along with IV Reglan to help with symptoms.  10:53 AM interpretation of her blood work by me reveal a CBC with no leukocytosis, hemoglobin stable.  CMP with no electrolyte derangement, creatinine level slightly decreased.  LFTs are within normal limits.  She received Reglan, bolus for symptomatic treatment.  UA without any nitrites or leukocytes to suggest infection.  10:54 AM patient did have ice chips to provide urine sample, has not had any episodes of vomiting while in the emergency department.  She is without any gynecological complaint at this time, hemodynamically stable for discharge.   Portions of this note were generated with Scientist, clinical (histocompatibility and immunogenetics). Dictation errors may occur despite best attempts at proofreading.    Final Clinical Impression(s) / ED Diagnoses Final diagnoses:  Nausea/vomiting in pregnancy    Rx / DC Orders ED Discharge Orders     None         Claude Manges, PA-C 08/28/22 1054    Benjiman Core, MD 08/28/22 1542

## 2022-08-30 DIAGNOSIS — N925 Other specified irregular menstruation: Secondary | ICD-10-CM | POA: Diagnosis not present

## 2022-09-04 DIAGNOSIS — F4312 Post-traumatic stress disorder, chronic: Secondary | ICD-10-CM | POA: Diagnosis not present

## 2022-09-06 DIAGNOSIS — Z331 Pregnant state, incidental: Secondary | ICD-10-CM | POA: Diagnosis not present

## 2022-09-06 DIAGNOSIS — R112 Nausea with vomiting, unspecified: Secondary | ICD-10-CM | POA: Diagnosis not present

## 2022-09-06 DIAGNOSIS — Z362 Encounter for other antenatal screening follow-up: Secondary | ICD-10-CM | POA: Diagnosis not present

## 2022-09-06 DIAGNOSIS — N912 Amenorrhea, unspecified: Secondary | ICD-10-CM | POA: Diagnosis not present

## 2022-09-06 LAB — OB RESULTS CONSOLE RUBELLA ANTIBODY, IGM: Rubella: IMMUNE

## 2022-09-06 LAB — HEPATITIS C ANTIBODY: HCV Ab: NEGATIVE

## 2022-09-06 LAB — OB RESULTS CONSOLE HEPATITIS B SURFACE ANTIGEN: Hepatitis B Surface Ag: NEGATIVE

## 2022-09-06 LAB — OB RESULTS CONSOLE HIV ANTIBODY (ROUTINE TESTING): HIV: NONREACTIVE

## 2022-09-06 LAB — OB RESULTS CONSOLE ANTIBODY SCREEN: Antibody Screen: NEGATIVE

## 2022-09-06 LAB — OB RESULTS CONSOLE GC/CHLAMYDIA
Chlamydia: NEGATIVE
Neisseria Gonorrhea: NEGATIVE

## 2022-09-06 LAB — OB RESULTS CONSOLE RPR: RPR: NONREACTIVE

## 2022-09-13 DIAGNOSIS — F4312 Post-traumatic stress disorder, chronic: Secondary | ICD-10-CM | POA: Diagnosis not present

## 2022-09-20 DIAGNOSIS — F4312 Post-traumatic stress disorder, chronic: Secondary | ICD-10-CM | POA: Diagnosis not present

## 2022-09-26 DIAGNOSIS — F4312 Post-traumatic stress disorder, chronic: Secondary | ICD-10-CM | POA: Diagnosis not present

## 2022-09-30 ENCOUNTER — Encounter: Payer: Self-pay | Admitting: Physician Assistant

## 2022-09-30 DIAGNOSIS — F4312 Post-traumatic stress disorder, chronic: Secondary | ICD-10-CM | POA: Diagnosis not present

## 2022-10-04 DIAGNOSIS — Z362 Encounter for other antenatal screening follow-up: Secondary | ICD-10-CM | POA: Diagnosis not present

## 2022-10-09 DIAGNOSIS — F4312 Post-traumatic stress disorder, chronic: Secondary | ICD-10-CM | POA: Diagnosis not present

## 2022-10-15 DIAGNOSIS — F4312 Post-traumatic stress disorder, chronic: Secondary | ICD-10-CM | POA: Diagnosis not present

## 2022-10-30 DIAGNOSIS — Z363 Encounter for antenatal screening for malformations: Secondary | ICD-10-CM | POA: Diagnosis not present

## 2022-10-30 DIAGNOSIS — Z3A19 19 weeks gestation of pregnancy: Secondary | ICD-10-CM | POA: Diagnosis not present

## 2022-11-01 DIAGNOSIS — F4312 Post-traumatic stress disorder, chronic: Secondary | ICD-10-CM | POA: Diagnosis not present

## 2022-11-06 DIAGNOSIS — F4312 Post-traumatic stress disorder, chronic: Secondary | ICD-10-CM | POA: Diagnosis not present

## 2022-11-12 DIAGNOSIS — F4312 Post-traumatic stress disorder, chronic: Secondary | ICD-10-CM | POA: Diagnosis not present

## 2022-11-21 DIAGNOSIS — F4312 Post-traumatic stress disorder, chronic: Secondary | ICD-10-CM | POA: Diagnosis not present

## 2022-11-26 ENCOUNTER — Inpatient Hospital Stay (HOSPITAL_COMMUNITY)
Admission: AD | Admit: 2022-11-26 | Discharge: 2022-11-26 | Disposition: A | Discharge status: Home / Self Care | Payer: BC Managed Care – PPO | Attending: Obstetrics and Gynecology | Admitting: Obstetrics and Gynecology

## 2022-11-26 ENCOUNTER — Encounter (HOSPITAL_COMMUNITY): Payer: Self-pay | Admitting: *Deleted

## 2022-11-26 DIAGNOSIS — Z3A23 23 weeks gestation of pregnancy: Secondary | ICD-10-CM

## 2022-11-26 DIAGNOSIS — O368120 Decreased fetal movements, second trimester, not applicable or unspecified: Secondary | ICD-10-CM | POA: Diagnosis not present

## 2022-11-26 NOTE — MAU Note (Signed)
Dr.Levuque remained at the bedside holding cardio monitor to obtain FHR tracing. Visual(ultrasound), audible, and palpable movement noted.  Pt reports that baby is very active at this time.  Pt continues to deny pain, contractions or cramping. Discharge instruction given per provider. Pt aware of follow up and to return to MAU when needed.

## 2022-11-26 NOTE — MAU Note (Addendum)
Pt says last time she felt baby move was yesterday- none during the night or this am.  Unable to hear heart tones in Triage - brought to Rm 123- FHR- 155 Difficult to put on monitor- Dr Leanora Cover to room.

## 2022-11-26 NOTE — MAU Note (Signed)
0458     145bpm 0503     Dr.Leveque at the bedside with ultrasound-FM noted with fetal movement 0507     140bpm 0521     FHR baseline 135bpm baseline with moderate variabilty 10x10 accels-No contractions per external monitor and pt report-abdomen remains soft and non tender      2 min loss of conduction with fetal movement 0524-0531     145bpm baseline with 7 min tracing-loss of conduction 0536       150bpm 0543      140bpm baseline with moderate variability

## 2022-11-26 NOTE — MAU Provider Note (Signed)
Chief Complaint:  No chief complaint on file.   HPI  HPI: Terri Ramirez is a 27 y.o. G1P0 at 59w0dwho presents to maternity admissions reporting not feeling baby move since 11/11 am (works nights and slept all day). Otherwise well without concerns or complaints.  She reports good fetal movement, denies LOF, vaginal bleeding, vaginal itching/burning, urinary symptoms, h/a, dizziness, n/v, diarrhea, constipation or fever/chills.  She denies headache, visual changes or RUQ abdominal pain.   Past Medical History: Past Medical History:  Diagnosis Date   Anxiety    Low blood pressure    Low sodium levels     Past obstetric history: OB History  Gravida Para Term Preterm AB Living  1            SAB IAB Ectopic Multiple Live Births               # Outcome Date GA Lbr Len/2nd Weight Sex Type Anes PTL Lv  1 Current             Past Surgical History: History reviewed. No pertinent surgical history.  Family History: Family History  Adopted: Yes  Family history unknown: Yes    Social History: Social History   Tobacco Use   Smoking status: Never   Smokeless tobacco: Never  Vaping Use   Vaping status: Never Used  Substance Use Topics   Alcohol use: Not Currently   Drug use: Not Currently    Types: Marijuana    Comment: none during preg    Allergies: No Known Allergies  Meds:  Medications Prior to Admission  Medication Sig Dispense Refill Last Dose   cholecalciferol (VITAMIN D3) 25 MCG (1000 UNIT) tablet Take 1,000 Units by mouth daily.   11/25/2022   Prenatal Vit-Fe Fumarate-FA (PRENATAL MULTIVITAMIN) TABS tablet Take 1 tablet by mouth daily at 12 noon.   11/25/2022   escitalopram (LEXAPRO) 10 MG tablet TAKE 1 TABLET BY MOUTH EVERY DAY 90 tablet 1 More than a month   miconazole (MICOTIN) 200 MG vaginal suppository Place 1 suppository (200 mg total) vaginally at bedtime. 3 suppository 0     I have reviewed patient's Past Medical Hx, Surgical Hx, Family Hx, Social Hx,  medications and allergies.   ROS:  Review of Systems Other systems negative  Physical Exam  Patient Vitals for the past 24 hrs:  BP Temp Pulse Resp Height Weight  11/26/22 0503 128/77 97.8 F (36.6 C) 85 20 5\' 3"  (1.6 m) 64.9 kg   Constitutional: Well-developed, well-nourished female in no acute distress.  Cardiovascular: normal rate and rhythm Respiratory: normal effort, clear to auscultation bilaterally GI: Abd soft, non-tender, gravid appropriate for gestational age.   No rebound or guarding. MS: Extremities nontender, no edema, normal ROM Neurologic: Alert and oriented x 4.  GU: Neg CVAT.   FHT:  Baseline 145 ,moderate variability, accelerations present, no decelerations (Hand held after locating FHR with bedside US, very active fetus on [place] and many kicks against the monitor) Contractions: None   Labs: No results found for this or any previous visit (from the past 24 hour(s)).    Imaging:  No results found.  MAU Course/MDM: I have reviewed the triage vital signs and the nursing notes.   Pertinent labs & imaging results that were available during my care of the patient were reviewed by me and considered in my medical decision making (see chart for details).      I have reviewed her medical records including past results, notes and  treatments.   I have ordered labs and reviewed results.  NST reviewed Treatments in MAU included Bedside US and NST.    Assessment: 1. [redacted] weeks gestation of pregnancy   2. Decreased fetal movements in second trimester, single or unspecified fetus   Very active fetus on bedside US. Mom feeling baby move after 15 minutes of monitoring.  Reassuring NST Bedside US: FHR 156, active fetus, vertex, fundal placenta, deepest vertical pocket 13 cm  Plan: Discharge home Labor precautions and fetal kick counts Follow up in Office for prenatal visits and recheck  Follow-up Information     [facility] Follow up.    Specialty: Obstetrics and Gynecology Why: As scheduled for prenatal care Contact information: [address] [place] [address] [phone]                Pt stable at time of discharge.  Wyn Forster, MD FMOB Fellow, Faculty practice [facility], Center for [facility]  11/26/2022 5:48 AM

## 2022-11-28 DIAGNOSIS — Z3A23 23 weeks gestation of pregnancy: Secondary | ICD-10-CM | POA: Diagnosis not present

## 2022-11-28 DIAGNOSIS — Z364 Encounter for antenatal screening for fetal growth retardation: Secondary | ICD-10-CM | POA: Diagnosis not present

## 2022-11-29 DIAGNOSIS — F4312 Post-traumatic stress disorder, chronic: Secondary | ICD-10-CM | POA: Diagnosis not present

## 2022-12-04 DIAGNOSIS — F4312 Post-traumatic stress disorder, chronic: Secondary | ICD-10-CM | POA: Diagnosis not present

## 2022-12-09 ENCOUNTER — Ambulatory Visit: Payer: BC Managed Care – PPO | Admitting: Physician Assistant

## 2022-12-09 VITALS — BP 102/68 | HR 84 | Temp 98.0°F | Ht 63.0 in | Wt 145.6 lb

## 2022-12-09 DIAGNOSIS — F32A Depression, unspecified: Secondary | ICD-10-CM | POA: Diagnosis not present

## 2022-12-09 DIAGNOSIS — F430 Acute stress reaction: Secondary | ICD-10-CM | POA: Diagnosis not present

## 2022-12-09 DIAGNOSIS — F419 Anxiety disorder, unspecified: Secondary | ICD-10-CM | POA: Diagnosis not present

## 2022-12-09 DIAGNOSIS — Z3A24 24 weeks gestation of pregnancy: Secondary | ICD-10-CM

## 2022-12-09 NOTE — Progress Notes (Unsigned)
Patient ID: Ariyan Clemens, female    DOB: 1995/06/08, 27 y.o.   MRN: [id number]   Assessment & Plan:  Acute stress disorder  Anxiety and depression  [redacted] weeks gestation of pregnancy   Patient is 27 yo G1P0 almost [redacted] week gestation pregnant woman presenting today with desire to terminate pregnancy.   She has a history of anxiety and depression and has not been taking her lexapro as directed; advised that she regularly and consistently take the lexapro to help with emotional state. She will continue counseling as she has been weekly.   I was able to call her OB/GYN and relay how patient is feeling / her desires to terminate. She is going to meet with patient tomorrow just to talk.   Patient understands that her options are limited at this time. In [place], abortion is illegal after 12 weeks, 6 days. Patient reported that she is looking into other states to have her pregnancy terminated.   Patient also has an appointment with [facility] next week for help with support / counseling / options / discussion.  Patient denies any SI or plan at this time. Info in AVS for [facility].  Patient to follow up with me next week. Reassured patient that her primary care team is here for her.   Time Spent: 39 minutes of total time was spent on the date of the encounter performing the following actions: chart review prior to seeing the patient, obtaining history, performing a medically necessary exam, counseling on the treatment plan, placing orders, and documenting in our EHR.     Subjective:    Chief Complaint  Patient presents with   Medication Problem    Patient would like to discuss a medication dealing with pregnancy    HPI Patient is in today for concerns. She is [redacted] weeks pregnant tomorrow. Going through counseling weekly.  Every conversation she's had lately is not wanting a baby. Says she has felt this way for months.  She is not with the baby's father, says they were friends  and just slept together once and got pregnant. She says she doesn't want to be tied or connected to this person for life. She also doesn't want to consider adoption because of her past.   She says she is "just not happy." Not eating well. Not taking lexapro consistently. Still working nightshift.  Sleeping about 5 hours.  Stays tearful and depressed. Denies any SI thoughts or plans.  Faith is important to her; expresses a tremendous amount of guilt for not feeling excited about the life growing inside of her.     Past Medical History:  Diagnosis Date   Anxiety    Low blood pressure    Low sodium levels     No past surgical history on file.  Family History  Adopted: Yes  Family history unknown: Yes    Social History   Tobacco Use   Smoking status: Never   Smokeless tobacco: Never  Vaping Use   Vaping status: Never Used  Substance Use Topics   Alcohol use: Not Currently   Drug use: Not Currently    Types: Marijuana    Comment: none during preg     No Known Allergies  Review of Systems NEGATIVE UNLESS OTHERWISE INDICATED IN HPI      Objective:     BP 102/68   Pulse 84   Temp 98 F (36.7 C) (Temporal)   Ht 5\' 3"  (1.6 m)   Wt 145  lb 9.6 oz (66 kg)   LMP 06/28/2022 (Approximate)   SpO2 100%   BMI 25.79 kg/m   Wt Readings from Last 3 Encounters:  12/09/22 145 lb 9.6 oz (66 kg)  11/26/22 143 lb 1.6 oz (64.9 kg)  08/28/22 123 lb 3.2 oz (55.9 kg)    BP Readings from Last 3 Encounters:  12/09/22 102/68  11/26/22 122/78  08/28/22 109/60     Physical Exam Vitals and nursing note reviewed.  Constitutional:      Appearance: Normal appearance. She is normal weight.  Cardiovascular:     Rate and Rhythm: Normal rate and regular rhythm.  Pulmonary:     Effort: Pulmonary effort is normal.     Breath sounds: Normal breath sounds.  Neurological:     Mental Status: She is alert.  Psychiatric:     Comments: Very tearful         Tashay Bozich M Kashaun Bebo,  PA-C

## 2022-12-09 NOTE — Patient Instructions (Addendum)
Dr. Nigel Bridgeman - 3:30 PM appointment on Tuesday 12/10/22  [facility] 24/7 nurse text: [phone] Office Phone: [phone] Email: [email or web address]   If you develop suicidal thoughts, please tell someone and immediately proceed to our local 24/7 crisis center, [facility].[address], [place], [place] [phone].

## 2022-12-10 DIAGNOSIS — F4312 Post-traumatic stress disorder, chronic: Secondary | ICD-10-CM | POA: Diagnosis not present

## 2022-12-16 ENCOUNTER — Encounter: Payer: Self-pay | Admitting: Physician Assistant

## 2022-12-19 ENCOUNTER — Ambulatory Visit: Payer: BC Managed Care – PPO | Admitting: Physician Assistant

## 2022-12-25 DIAGNOSIS — Z369 Encounter for antenatal screening, unspecified: Secondary | ICD-10-CM | POA: Diagnosis not present

## 2022-12-27 DIAGNOSIS — F4312 Post-traumatic stress disorder, chronic: Secondary | ICD-10-CM | POA: Diagnosis not present

## 2022-12-31 ENCOUNTER — Emergency Department (HOSPITAL_COMMUNITY)
Admission: EM | Admit: 2022-12-31 | Discharge: 2022-12-31 | Disposition: A | Discharge status: Home / Self Care | Payer: BC Managed Care – PPO | Attending: Emergency Medicine | Admitting: Emergency Medicine

## 2022-12-31 ENCOUNTER — Emergency Department (HOSPITAL_COMMUNITY): Payer: BC Managed Care – PPO

## 2022-12-31 ENCOUNTER — Encounter (HOSPITAL_COMMUNITY): Payer: Self-pay | Admitting: Emergency Medicine

## 2022-12-31 DIAGNOSIS — M79671 Pain in right foot: Secondary | ICD-10-CM | POA: Diagnosis not present

## 2022-12-31 DIAGNOSIS — S93401A Sprain of unspecified ligament of right ankle, initial encounter: Secondary | ICD-10-CM | POA: Insufficient documentation

## 2022-12-31 DIAGNOSIS — X501XXA Overexertion from prolonged static or awkward postures, initial encounter: Secondary | ICD-10-CM | POA: Insufficient documentation

## 2022-12-31 DIAGNOSIS — M25571 Pain in right ankle and joints of right foot: Secondary | ICD-10-CM | POA: Diagnosis not present

## 2022-12-31 DIAGNOSIS — S93491A Sprain of other ligament of right ankle, initial encounter: Secondary | ICD-10-CM | POA: Diagnosis not present

## 2022-12-31 DIAGNOSIS — O9A213 Injury, poisoning and certain other consequences of external causes complicating pregnancy, third trimester: Secondary | ICD-10-CM | POA: Diagnosis not present

## 2022-12-31 MED ORDER — ACETAMINOPHEN 325 MG PO TABS
650.0000 mg | ORAL_TABLET | Freq: Once | ORAL | Status: AC
  Administered 2022-12-31: 650 mg via ORAL
  Filled 2022-12-31: qty 2

## 2022-12-31 NOTE — ED Triage Notes (Signed)
PT was chasing her dog and thinks she may have bent her foot the wrong way. Denies fall. She says initially did not feel pain bc this happened at 1800. Just started hurting around 1130. LMP in May and getting prenatal care. No vaginal discharge, belly pain or complaints about baby.

## 2022-12-31 NOTE — ED Provider Notes (Signed)
[place] EMERGENCY DEPARTMENT AT [facility] Provider Note   CSN: [id number] Arrival date & time: 12/31/22  0109     History  No chief complaint on file.   Terri Ramirez is a 27 y.o. female.  The history is provided by the patient.  Terri Ramirez is a 27 y.o. female who presents to the Emergency Department complaining of foot and ankle pain.  She presents to the emergency department for evaluation of right foot and ankle pain.  She states that she was trying to keep her dog from getting out of the house around 6 PM and she rolled her ankle.  She did not even realize what way she rolled it.  She had no pain until around 11 PM when she developed significant pain to the right ankle diffusely but greatest on the medial aspect.  She has pain on weightbearing as well as range of motion.  She is currently [redacted] weeks pregnant, has gestational diabetes.  No additional medical problems.      Home Medications Prior to Admission medications   Medication Sig Start Date End Date Taking? Authorizing Provider  cholecalciferol (VITAMIN D3) 25 MCG (1000 UNIT) tablet Take 1,000 Units by mouth daily.    [provider]  escitalopram (LEXAPRO) 10 MG tablet TAKE 1 TABLET BY MOUTH EVERY DAY 06/18/22   Allwardt, Alyssa M, PA-C  miconazole (MICOTIN) 200 MG vaginal suppository Place 1 suppository (200 mg total) vaginally at bedtime. Patient not taking: Reported on 12/09/2022 08/16/22   Allwardt, Crist Infante, PA-C  Prenatal Vit-Fe Fumarate-FA (PRENATAL MULTIVITAMIN) TABS tablet Take 1 tablet by mouth daily at 12 noon.    [provider]      Allergies    Patient has no known allergies.    Review of Systems   Review of Systems  All other systems reviewed and are negative.   Physical Exam Updated Vital Signs BP 121/75 (BP Location: Left Arm)   Pulse 97   Temp (!) 97.5 F (36.4 C) (Oral)   Resp (!) 24   LMP 06/28/2022 (Approximate)   SpO2 100%  Physical Exam Vitals and  nursing note reviewed.  Constitutional:      Appearance: She is well-developed.  HENT:     Head: Normocephalic and atraumatic.  Cardiovascular:     Rate and Rhythm: Normal rate and regular rhythm.  Pulmonary:     Effort: Pulmonary effort is normal. No respiratory distress.  Musculoskeletal:     Comments: Soft tissue swelling and tenderness to the medial and lateral malleoli.  2+ DP pulse.  Skin:    General: Skin is warm and dry.  Neurological:     Mental Status: She is alert and oriented to person, place, and time.  Psychiatric:        Behavior: Behavior normal.     ED Results / Procedures / Treatments   Labs (all labs ordered are listed, but only abnormal results are displayed) Labs Reviewed - No data to display  EKG None  Radiology DG Foot Complete Right Result Date: 12/31/2022 CLINICAL DATA:  Right foot injury, pain EXAM: RIGHT FOOT COMPLETE - 3+ VIEW COMPARISON:  None Available. FINDINGS: There is no evidence of fracture or dislocation. There is no evidence of arthropathy or other focal bone abnormality. Soft tissues are unremarkable. IMPRESSION: Negative. Electronically Signed   By: Helyn Numbers M.D.   On: 12/31/2022 02:36    Procedures Procedures    Medications Ordered in ED Medications  acetaminophen (TYLENOL) tablet 650  mg (has no administration in time range)    ED Course/ Medical Decision Making/ A&P                                 Medical Decision Making Amount and/or Complexity of Data Reviewed Radiology: ordered.  Risk OTC drugs.   Patient here for evaluation of right ankle pain after a rolling injury around 6 PM.  Her pain did not begin until around 11 PM.  She does have soft tissue swelling and tenderness on examination.  Plain films of the foot are negative for acute fracture or dislocation.  Low index of suspicion for malleoli or fracture given delayed development of pain.  Exam is not consistent with gouty or septic arthritis.  Will provide  ankle splint for pain, weightbearing as tolerated.  Discussed acetaminophen as needed with outpatient follow-up and return precautions.        Final Clinical Impression(s) / ED Diagnoses Final diagnoses:  Sprain of right ankle, unspecified ligament, initial encounter    Rx / DC Orders ED Discharge Orders     None         Tilden Fossa, MD 12/31/22 [phone]

## 2023-01-01 ENCOUNTER — Encounter: Payer: BC Managed Care – PPO | Attending: Obstetrics and Gynecology | Admitting: Dietician

## 2023-01-01 DIAGNOSIS — O24419 Gestational diabetes mellitus in pregnancy, unspecified control: Secondary | ICD-10-CM | POA: Diagnosis not present

## 2023-01-01 NOTE — Progress Notes (Signed)
Patient was seen on 01/01/2023 for Gestational Diabetes self-management class at the [facility]. The following learning objectives were met by the patient during this course:  States the definition of Gestational Diabetes States why dietary management is important in controlling blood glucose Describes the effects each nutrient has on blood glucose levels Demonstrates ability to create a balanced meal plan Demonstrates carbohydrate counting  States when to check blood glucose levels Demonstrates proper blood glucose monitoring techniques States the effect of stress and exercise on blood glucose levels States the importance of limiting caffeine and abstaining from alcohol and smoking  Blood glucose monitor: Pt presents with Accu Chek  Pt reports monitoring blood sugar before and after meals with recent values ranging "78-178" mg/dL  Patient instructed to monitor glucose levels: QID FBS: 60 - <90 1 hour: <140 2 hour: <120  *Patient received handouts: Nutrition Diabetes and Pregnancy Carbohydrate Counting List Blood glucose log Snack ideas for diabetes during pregnancy  Patient will be seen for follow-up as needed.

## 2023-01-03 DIAGNOSIS — F4312 Post-traumatic stress disorder, chronic: Secondary | ICD-10-CM | POA: Diagnosis not present

## 2023-01-06 ENCOUNTER — Ambulatory Visit: Payer: BC Managed Care – PPO

## 2023-01-15 NOTE — L&D Delivery Note (Signed)
 Delivery Note Labor onset: 03/14/2023 Labor Onset Time: 1800 Complete dilation at 1:00 AM Onset of pushing at 0100 FHR second stage Cat 1 Analgesia/Anesthesia intrapartum: unmedicated  Guided pushing with maternal urge. Delivery of a viable female at [phone]. Fetal head delivered in ROA position.  Nuchal cord: none.  Infant placed on maternal abd, dried, and tactile stim.  Cord double clamped after 3 min and cut by father.  RN x3 present for birth.  Cord blood sample collected: Yes Arterial cord blood sample collected: No  Placenta delivered Schultz side, intact, with 3 VC.  Placenta to patient. Uterine tone firm, bleeding small, no clots  Left vaginal wall laceration identified.  Anesthesia: Lidocaine 1% Repair: 2-0 Vicryl CT  QBL (mL): 250 Complications: none APGAR: APGAR (1 MIN): 8  APGAR (5 MINS): 9  APGAR (10 MINS):   Mom to postpartum.  Baby to Couplet care / Skin to Skin. Circumcision Yes  Roma Schanz DNP, CNM 03/15/2023, 2:40 AM

## 2023-01-17 DIAGNOSIS — F4312 Post-traumatic stress disorder, chronic: Secondary | ICD-10-CM | POA: Diagnosis not present

## 2023-01-22 ENCOUNTER — Inpatient Hospital Stay (HOSPITAL_COMMUNITY)
Admission: AD | Admit: 2023-01-22 | Discharge: 2023-01-23 | Disposition: A | Discharge status: Home / Self Care | Payer: BC Managed Care – PPO | Attending: Obstetrics and Gynecology | Admitting: Obstetrics and Gynecology

## 2023-01-22 ENCOUNTER — Encounter (HOSPITAL_COMMUNITY): Payer: Self-pay | Admitting: Obstetrics and Gynecology

## 2023-01-22 DIAGNOSIS — O212 Late vomiting of pregnancy: Secondary | ICD-10-CM | POA: Diagnosis not present

## 2023-01-22 DIAGNOSIS — O219 Vomiting of pregnancy, unspecified: Secondary | ICD-10-CM

## 2023-01-22 DIAGNOSIS — O26893 Other specified pregnancy related conditions, third trimester: Secondary | ICD-10-CM | POA: Insufficient documentation

## 2023-01-22 DIAGNOSIS — O4703 False labor before 37 completed weeks of gestation, third trimester: Secondary | ICD-10-CM | POA: Diagnosis not present

## 2023-01-22 DIAGNOSIS — Z3A31 31 weeks gestation of pregnancy: Secondary | ICD-10-CM | POA: Diagnosis not present

## 2023-01-22 DIAGNOSIS — R519 Headache, unspecified: Secondary | ICD-10-CM | POA: Insufficient documentation

## 2023-01-22 HISTORY — DX: Gestational diabetes mellitus in pregnancy, unspecified control: O24.419

## 2023-01-22 LAB — URINALYSIS, ROUTINE W REFLEX MICROSCOPIC
Bilirubin Urine: NEGATIVE
Glucose, UA: NEGATIVE mg/dL
Hgb urine dipstick: NEGATIVE
Ketones, ur: NEGATIVE mg/dL
Leukocytes,Ua: NEGATIVE
Nitrite: NEGATIVE
Protein, ur: NEGATIVE mg/dL
Specific Gravity, Urine: 1.009 (ref 1.005–1.030)
pH: 7 (ref 5.0–8.0)

## 2023-01-22 LAB — FETAL FIBRONECTIN: Fetal Fibronectin: NEGATIVE

## 2023-01-22 MED ORDER — PROMETHAZINE HCL 25 MG PO TABS: 25.0000 mg | ORAL_TABLET | Freq: Four times a day (QID) | ORAL | 2 refills | Status: DC | PRN

## 2023-01-22 MED ORDER — OXYCODONE HCL 5 MG PO CAPS: 5.0000 mg | ORAL_CAPSULE | ORAL | 0 refills | Status: DC | PRN

## 2023-01-22 MED ORDER — ONDANSETRON 4 MG PO TBDP: 4.0000 mg | ORAL_TABLET | Freq: Four times a day (QID) | ORAL | 0 refills | Status: DC | PRN

## 2023-01-22 MED ORDER — NIFEDIPINE 10 MG PO CAPS
10.0000 mg | ORAL_CAPSULE | ORAL | Status: DC | PRN
Start: 2023-01-22 — End: 2023-01-23
  Administered 2023-01-22 (×2): 10 mg via ORAL
  Filled 2023-01-22 (×2): qty 1

## 2023-01-22 MED ORDER — ACETAMINOPHEN-CAFFEINE 500-65 MG PO TABS
2.0000 | ORAL_TABLET | Freq: Once | ORAL | Status: AC
Start: 2023-01-22 — End: 2023-01-22
  Administered 2023-01-22: 2 via ORAL
  Filled 2023-01-22: qty 2

## 2023-01-22 NOTE — MAU Note (Signed)
.  Terri Ramirez is a 28 y.o. at [redacted]w[redacted]d here in MAU reporting: Since last night pt states she feels like her abdomen is rock hard and feels pressure. She was rating this pain 7/10. Pt describes pain like a period cramp. Pt reports feeling nauseas and reports vomiting 3 times over the past 24 hours.   Pt states her PCP prescribed promethazine  for nausea but the pharmacy said it was not good for pregnancy so she did not take it.   NO LOF, NO BLEEDING, +fm.   Vitals:   01/22/23 2115 01/22/23 2118  BP:  133/81  Pulse:  90  Resp:  16  Temp:  97.8 F (36.6 C)  SpO2: 100% 99%     Pain score: No pain currently.   FHT:144  Lab orders placed from triage: UA

## 2023-01-22 NOTE — MAU Provider Note (Signed)
 Chief Complaint:  No chief complaint on file.   Event Date/Time   First Provider Initiated Contact with Patient 01/22/23 2118     HPI: Terri Ramirez is a 28 y.o. G1P0 at 3w1dwho presents to maternity admissions reporting uterine cramping and tightening since yesterday. Has had nausea and loose stools for a week.   Was prescribed Phenergan  but pharmacist told her it was not safe to take this late in pregnancy, so she did not fill it. . She reports good fetal movement, denies LOF, vaginal bleeding,  urinary symptoms, diarrhea, constipation or fever/chills.    Abdominal Pain This is a recurrent problem. The current episode started yesterday. The quality of the pain is cramping. The abdominal pain does not radiate. Pertinent negatives include no diarrhea, dysuria, fever or frequency. Nothing aggravates the pain. The pain is relieved by Nothing. She has tried nothing for the symptoms.    RN Note: Terri Ramirez is a 28 y.o. at [redacted]w[redacted]d here in MAU reporting: Since last night pt states she feels like her abdomen is rock hard and feels pressure. She was rating this pain 7/10. Pt describes pain like a period cramp. Pt reports feeling nauseas and reports vomiting 3 times over the past 24 hours.   Pt states her PCP prescribed promethazine  for nausea but the pharmacy said it was not good for pregnancy so she did not take it.   NO LOF, NO BLEEDING, +fm.   Past Medical History: Past Medical History:  Diagnosis Date   Anxiety    Low blood pressure    Low sodium levels     Past obstetric history: OB History  Gravida Para Term Preterm AB Living  1       SAB IAB Ectopic Multiple Live Births          # Outcome Date GA Lbr Len/2nd Weight Sex Type Anes PTL Lv  1 Current             Past Surgical History: No past surgical history on file.  Family History: Family History  Adopted: Yes  Family history unknown: Yes    Social History: Social History   Tobacco Use   Smoking status: Never    Smokeless tobacco: Never  Vaping Use   Vaping status: Never Used  Substance Use Topics   Alcohol use: Not Currently   Drug use: Not Currently    Types: Marijuana    Comment: none during preg    Allergies: No Known Allergies  Meds:  Medications Prior to Admission  Medication Sig Dispense Refill Last Dose/Taking   cholecalciferol (VITAMIN D3) 25 MCG (1000 UNIT) tablet Take 1,000 Units by mouth daily.      escitalopram  (LEXAPRO ) 10 MG tablet TAKE 1 TABLET BY MOUTH EVERY DAY 90 tablet 1    miconazole  (MICOTIN) 200 MG vaginal suppository Place 1 suppository (200 mg total) vaginally at bedtime. (Patient not taking: Reported on 12/09/2022) 3 suppository 0    Prenatal Vit-Fe Fumarate-FA (PRENATAL MULTIVITAMIN) TABS tablet Take 1 tablet by mouth daily at 12 noon.       I have reviewed patient's Past Medical Hx, Surgical Hx, Family Hx, Social Hx, medications and allergies.   ROS:  Review of Systems  Constitutional:  Negative for fever.  Gastrointestinal:  Positive for abdominal pain. Negative for diarrhea.  Genitourinary:  Negative for dysuria and frequency.   Other systems negative  Physical Exam  No data found. Constitutional: Well-developed, well-nourished female in no acute distress.  Cardiovascular: normal rate  Respiratory:  normal effort GI: Abd soft, non-tender, gravid appropriate for gestational age.   No rebound or guarding. MS: Extremities nontender, no edema, normal ROM Neurologic: Alert and oriented x 4.  GU: Neg CVAT.  PELVIC EXAM:  Dilation: Closed Effacement (%): 20 Station: Ballotable Exam by:: Earnie Pouch, CNM  FHT:  Baseline 135 , moderate variability, accelerations present, no decelerations Contractions:  Irregular     Labs: Results for orders placed or performed during the hospital encounter of 01/22/23 (from the past 24 hours)  Urinalysis, Routine w reflex microscopic -Urine, Clean Catch     Status: None   Collection Time: 01/22/23  9:01 PM  Result  Value Ref Range   Color, Urine YELLOW YELLOW   APPearance CLEAR CLEAR   Specific Gravity, Urine 1.009 1.005 - 1.030   pH 7.0 5.0 - 8.0   Glucose, UA NEGATIVE NEGATIVE mg/dL   Hgb urine dipstick NEGATIVE NEGATIVE   Bilirubin Urine NEGATIVE NEGATIVE   Ketones, ur NEGATIVE NEGATIVE mg/dL   Protein, ur NEGATIVE NEGATIVE mg/dL   Nitrite NEGATIVE NEGATIVE   Leukocytes,Ua NEGATIVE NEGATIVE  Fetal fibronectin     Status: None   Collection Time: 01/22/23  9:17 PM  Result Value Ref Range   Fetal Fibronectin NEGATIVE NEGATIVE       Imaging:    MAU Course/MDM: I have reviewed the triage vital signs and the nursing notes.   Pertinent labs & imaging results that were available during my care of the patient were reviewed by me and considered in my medical decision making (see chart for details).      I have reviewed her medical records including past results, notes and treatments.   I have ordered labs and reviewed results. UA is clear and Fetal Fibronectin was negative. NST reviewed  Treatments in MAU included EFM, Procardia  x 2 doses which diminished cramps.   Patient developed a headache post Procardia  so was given Excedrin Tension which slightly improved her headache.  Will send 2 tablets of OxyIR to pharmacy since she was driving..    Assessment: Single IUP at [redacted]w[redacted]d Threatened preterm labor Nausea and vomiting Headache resulting from Procardia   Plan: Discharge home Preterm Labor precautions and fetal kick counts Rx Phenergan  and zofran  sent to pharmacy Rx Oxy IR x 2 tab for headache Follow up in Office for prenatal visits and recheck Encouraged to return if she develops worsening of symptoms, increase in pain, fever, or other concerning symptoms.   Pt stable at time of discharge.  Earnie Pouch CNM, MSN Certified Nurse-Midwife 01/22/2023 9:18 PM

## 2023-01-23 ENCOUNTER — Encounter (HOSPITAL_COMMUNITY): Payer: Self-pay | Admitting: Obstetrics and Gynecology

## 2023-01-23 ENCOUNTER — Inpatient Hospital Stay (HOSPITAL_COMMUNITY)
Admission: AD | Admit: 2023-01-23 | Discharge: 2023-01-23 | Disposition: A | Discharge status: Home / Self Care | Payer: BC Managed Care – PPO | Attending: Obstetrics and Gynecology | Admitting: Obstetrics and Gynecology

## 2023-01-23 DIAGNOSIS — O4703 False labor before 37 completed weeks of gestation, third trimester: Secondary | ICD-10-CM

## 2023-01-23 DIAGNOSIS — Z3A31 31 weeks gestation of pregnancy: Secondary | ICD-10-CM

## 2023-01-23 DIAGNOSIS — O219 Vomiting of pregnancy, unspecified: Secondary | ICD-10-CM | POA: Diagnosis not present

## 2023-01-23 DIAGNOSIS — R519 Headache, unspecified: Secondary | ICD-10-CM

## 2023-01-23 DIAGNOSIS — O99343 Other mental disorders complicating pregnancy, third trimester: Secondary | ICD-10-CM | POA: Insufficient documentation

## 2023-01-23 DIAGNOSIS — O212 Late vomiting of pregnancy: Secondary | ICD-10-CM | POA: Insufficient documentation

## 2023-01-23 DIAGNOSIS — O479 False labor, unspecified: Secondary | ICD-10-CM

## 2023-01-23 LAB — URINALYSIS, ROUTINE W REFLEX MICROSCOPIC
Bilirubin Urine: NEGATIVE
Glucose, UA: NEGATIVE mg/dL
Hgb urine dipstick: NEGATIVE
Ketones, ur: NEGATIVE mg/dL
Leukocytes,Ua: NEGATIVE
Nitrite: NEGATIVE
Protein, ur: NEGATIVE mg/dL
Specific Gravity, Urine: 1.01 (ref 1.005–1.030)
pH: 6 (ref 5.0–8.0)

## 2023-01-23 MED ORDER — ONDANSETRON 4 MG PO TBDP
4.0000 mg | ORAL_TABLET | Freq: Once | ORAL | Status: AC
  Administered 2023-01-23: 4 mg via ORAL
  Filled 2023-01-23: qty 1

## 2023-01-23 MED ORDER — FAMOTIDINE 20 MG PO TABS
40.0000 mg | ORAL_TABLET | Freq: Once | ORAL | Status: AC
  Administered 2023-01-23: 40 mg via ORAL
  Filled 2023-01-23: qty 2

## 2023-01-23 NOTE — Discharge Instructions (Signed)
 It was a pleasure taking care of you today!  I am glad your nausea and vomiting is improved.  I sent a medication called Zofran  and Phenergan  to your pharmacy to help with nausea and vomiting.  I would start with the Zofran  and if that does not help you can try the Phenergan .  They also sent some pain medication oxycodone  to your pharmacy at your previous visit.  If you have any issues or concerns please return for further evaluation or follow-up with your primary OB provider.

## 2023-01-23 NOTE — MAU Note (Signed)
.  Terri Ramirez is a 28 y.o. at [redacted]w[redacted]d here in MAU reporting: reporting she was seen in MAU last night for contractions and nausea - given procardia  and ctx resolved. She went home and the contractions came right back. Ctx are irregular. She also reports on-going nausea for the past week with 2 episodes of emesis since last night - not taking any medications. Denies VB or LOF. Reports good FM.  LMP: N/A Onset of complaint: Yesterday Pain score: 8/10 Vitals:   01/23/23 0855  BP: 128/87  Pulse: 79  Resp: (!) 24  Temp: (!) 97.5 F (36.4 C)  SpO2: 100%     FHT:145 Lab orders placed from triage: UA

## 2023-01-23 NOTE — MAU Provider Note (Signed)
 History     CSN: [id number]  Arrival date and time: 01/23/23 9161   None     Chief Complaint  Patient presents with   Contractions   Back Pain   Nausea   HPI Terri Ramirez is a 27 yp G1 presenting for nausea as well as contractions.  She was seen overnight for contractions and treated with Procardia .  She got a headache from this and received Excedrin tension headache.  She was sent Zofran  and Phenergan  as well as oxycodone  to her pharmacy at discharge but has been unable to pick these up.  Reports that once she left she started having worsening nausea and had episodes of vomiting.  She is also continue to have contractions.  Reports the contractions are painful but occur infrequently.  Reports no other issues.  OB History     Gravida  1   Para      Term      Preterm      AB      Living         SAB      IAB      Ectopic      Multiple      Live Births              Past Medical History:  Diagnosis Date   Anxiety    Gestational diabetes    Low blood pressure    Low sodium levels     History reviewed. No pertinent surgical history.  Family History  Adopted: Yes  Family history unknown: Yes    Social History   Tobacco Use   Smoking status: Never   Smokeless tobacco: Never  Vaping Use   Vaping status: Never Used  Substance Use Topics   Alcohol use: Not Currently   Drug use: Not Currently    Types: Marijuana    Comment: none during preg    Allergies: No Known Allergies  Medications Prior to Admission  Medication Sig Dispense Refill Last Dose/Taking   Prenatal Vit-Fe Fumarate-FA (PRENATAL MULTIVITAMIN) TABS tablet Take 1 tablet by mouth daily at 12 noon.   01/23/2023 Morning   cholecalciferol (VITAMIN D3) 25 MCG (1000 UNIT) tablet Take 1,000 Units by mouth daily.      escitalopram  (LEXAPRO ) 10 MG tablet TAKE 1 TABLET BY MOUTH EVERY DAY 90 tablet 1    metFORMIN  (GLUCOPHAGE ) 500 MG tablet Take 500 mg by mouth 2 (two) times daily with a meal.       miconazole  (MICOTIN) 200 MG vaginal suppository Place 1 suppository (200 mg total) vaginally at bedtime. (Patient not taking: Reported on 12/09/2022) 3 suppository 0    ondansetron  (ZOFRAN -ODT) 4 MG disintegrating tablet Take 1 tablet (4 mg total) by mouth every 6 (six) hours as needed for nausea. 20 tablet 0    oxycodone  (OXY-IR) 5 MG capsule Take 1 capsule (5 mg total) by mouth every 4 (four) hours as needed for pain (headache). 2 capsule 0    promethazine  (PHENERGAN ) 25 MG tablet Take 1 tablet (25 mg total) by mouth every 6 (six) hours as needed for nausea or vomiting. 30 tablet 2     Review of Systems  Constitutional:  Negative for chills and fever.  HENT:  Negative for congestion and rhinorrhea.   Eyes:  Negative for visual disturbance.  Respiratory:  Negative for shortness of breath.   Cardiovascular:  Negative for chest pain.  Gastrointestinal:  Positive for abdominal pain, nausea and vomiting.  Endocrine: Negative for polyuria.  Physical Exam   Blood pressure 125/74, pulse 75, temperature (!) 97.5 F (36.4 C), temperature source Oral, resp. rate (!) 24, last menstrual period 06/28/2022, SpO2 100%.  Physical Exam Vitals reviewed.  Constitutional:      Appearance: Normal appearance.  HENT:     Head: Normocephalic and atraumatic.     Nose: Nose normal.     Mouth/Throat:     Mouth: Mucous membranes are moist.  Eyes:     Pupils: Pupils are equal, round, and reactive to light.  Cardiovascular:     Rate and Rhythm: Normal rate.  Pulmonary:     Effort: Pulmonary effort is normal. No respiratory distress.  Abdominal:     Comments: Gravid  Genitourinary:    Comments: Closed/thick/-3 Musculoskeletal:        General: Normal range of motion.  Skin:    General: Skin is warm.     Capillary Refill: Capillary refill takes less than 2 seconds.  Neurological:     General: No focal deficit present.     Mental Status: She is alert.  Psychiatric:        Mood and Affect: Mood  normal.     MAU Course  Procedures  MDM NST Zofran    Assessment and Plan  Terri Ramirez is a 28 yo G1 @[redacted]w[redacted]d  presenting for nausea and vomiting in pregnancy.  Also having contractions.  Nausea and vomiting Patient was seen last night and after discharge was having nausea and vomiting.  Was sent nausea medications to pharmacy but was unable to get them overnight.  Gave patient Zofran  and she noticed great improvement.  P.o. challenge tolerated well.  Patient will go pick medications up from the pharmacy.  Return precautions given.  False labor Patient presenting with contractions.  Had issues last night and was given Procardia  but had a headache after and would not like that again.  Contractions are rare, every 30 minutes or so.  She has not been drinking plenty of fluids and discussed that adequate hydration may help with  Braxton Hicks contractions.  Discussed strict return precautions and patient discharged home.  Terri Ramirez V Terri Ramirez 01/23/2023, 10:36 AM

## 2023-01-27 DIAGNOSIS — F4312 Post-traumatic stress disorder, chronic: Secondary | ICD-10-CM | POA: Diagnosis not present

## 2023-02-06 DIAGNOSIS — Z3A33 33 weeks gestation of pregnancy: Secondary | ICD-10-CM | POA: Diagnosis not present

## 2023-02-06 DIAGNOSIS — O24419 Gestational diabetes mellitus in pregnancy, unspecified control: Secondary | ICD-10-CM | POA: Diagnosis not present

## 2023-02-07 DIAGNOSIS — F4312 Post-traumatic stress disorder, chronic: Secondary | ICD-10-CM | POA: Diagnosis not present

## 2023-02-11 DIAGNOSIS — O24419 Gestational diabetes mellitus in pregnancy, unspecified control: Secondary | ICD-10-CM | POA: Diagnosis not present

## 2023-02-11 DIAGNOSIS — Z3A34 34 weeks gestation of pregnancy: Secondary | ICD-10-CM | POA: Diagnosis not present

## 2023-02-12 DIAGNOSIS — F4312 Post-traumatic stress disorder, chronic: Secondary | ICD-10-CM | POA: Diagnosis not present

## 2023-02-17 DIAGNOSIS — Z3A34 34 weeks gestation of pregnancy: Secondary | ICD-10-CM | POA: Diagnosis not present

## 2023-02-17 DIAGNOSIS — O24419 Gestational diabetes mellitus in pregnancy, unspecified control: Secondary | ICD-10-CM | POA: Diagnosis not present

## 2023-02-17 DIAGNOSIS — Z364 Encounter for antenatal screening for fetal growth retardation: Secondary | ICD-10-CM | POA: Diagnosis not present

## 2023-02-18 DIAGNOSIS — F4312 Post-traumatic stress disorder, chronic: Secondary | ICD-10-CM | POA: Diagnosis not present

## 2023-02-24 DIAGNOSIS — Z3A35 35 weeks gestation of pregnancy: Secondary | ICD-10-CM | POA: Diagnosis not present

## 2023-02-24 DIAGNOSIS — Z113 Encounter for screening for infections with a predominantly sexual mode of transmission: Secondary | ICD-10-CM | POA: Diagnosis not present

## 2023-02-24 DIAGNOSIS — Z364 Encounter for antenatal screening for fetal growth retardation: Secondary | ICD-10-CM | POA: Diagnosis not present

## 2023-02-24 DIAGNOSIS — Z369 Encounter for antenatal screening, unspecified: Secondary | ICD-10-CM | POA: Diagnosis not present

## 2023-02-24 LAB — OB RESULTS CONSOLE GBS: GBS: NEGATIVE

## 2023-02-27 DIAGNOSIS — F4312 Post-traumatic stress disorder, chronic: Secondary | ICD-10-CM | POA: Diagnosis not present

## 2023-03-05 DIAGNOSIS — Z364 Encounter for antenatal screening for fetal growth retardation: Secondary | ICD-10-CM | POA: Diagnosis not present

## 2023-03-05 DIAGNOSIS — Z3A37 37 weeks gestation of pregnancy: Secondary | ICD-10-CM | POA: Diagnosis not present

## 2023-03-07 DIAGNOSIS — F4312 Post-traumatic stress disorder, chronic: Secondary | ICD-10-CM | POA: Diagnosis not present

## 2023-03-11 ENCOUNTER — Encounter: Payer: Self-pay | Admitting: Physician Assistant

## 2023-03-11 ENCOUNTER — Encounter (HOSPITAL_COMMUNITY): Payer: Self-pay | Admitting: Obstetrics and Gynecology

## 2023-03-11 ENCOUNTER — Inpatient Hospital Stay (HOSPITAL_COMMUNITY)
Admission: AD | Admit: 2023-03-11 | Discharge: 2023-03-11 | Disposition: A | Discharge status: Home / Self Care | Payer: BC Managed Care – PPO | Attending: Obstetrics and Gynecology | Admitting: Obstetrics and Gynecology

## 2023-03-11 DIAGNOSIS — Z3689 Encounter for other specified antenatal screening: Secondary | ICD-10-CM | POA: Insufficient documentation

## 2023-03-11 DIAGNOSIS — O26893 Other specified pregnancy related conditions, third trimester: Secondary | ICD-10-CM | POA: Insufficient documentation

## 2023-03-11 DIAGNOSIS — Z3A38 38 weeks gestation of pregnancy: Secondary | ICD-10-CM

## 2023-03-11 DIAGNOSIS — A6000 Herpesviral infection of urogenital system, unspecified: Secondary | ICD-10-CM | POA: Diagnosis not present

## 2023-03-11 DIAGNOSIS — O24415 Gestational diabetes mellitus in pregnancy, controlled by oral hypoglycemic drugs: Secondary | ICD-10-CM | POA: Insufficient documentation

## 2023-03-11 DIAGNOSIS — Z364 Encounter for antenatal screening for fetal growth retardation: Secondary | ICD-10-CM | POA: Diagnosis not present

## 2023-03-11 DIAGNOSIS — F4312 Post-traumatic stress disorder, chronic: Secondary | ICD-10-CM | POA: Diagnosis not present

## 2023-03-11 DIAGNOSIS — M549 Dorsalgia, unspecified: Secondary | ICD-10-CM | POA: Insufficient documentation

## 2023-03-11 DIAGNOSIS — O9832 Other infections with a predominantly sexual mode of transmission complicating childbirth: Secondary | ICD-10-CM | POA: Diagnosis not present

## 2023-03-11 DIAGNOSIS — R030 Elevated blood-pressure reading, without diagnosis of hypertension: Secondary | ICD-10-CM | POA: Diagnosis not present

## 2023-03-11 DIAGNOSIS — O163 Unspecified maternal hypertension, third trimester: Secondary | ICD-10-CM

## 2023-03-11 DIAGNOSIS — O24425 Gestational diabetes mellitus in childbirth, controlled by oral hypoglycemic drugs: Secondary | ICD-10-CM | POA: Diagnosis not present

## 2023-03-11 DIAGNOSIS — O134 Gestational [pregnancy-induced] hypertension without significant proteinuria, complicating childbirth: Secondary | ICD-10-CM | POA: Diagnosis not present

## 2023-03-11 LAB — PROTEIN / CREATININE RATIO, URINE
Creatinine, Urine: 42 mg/dL
Protein Creatinine Ratio: 0.19 mg/mg{creat} — ABNORMAL HIGH (ref 0.00–0.15)
Total Protein, Urine: 8 mg/dL

## 2023-03-11 LAB — COMPREHENSIVE METABOLIC PANEL
ALT: 20 U/L (ref 0–44)
AST: 23 U/L (ref 15–41)
Albumin: 2.5 g/dL — ABNORMAL LOW (ref 3.5–5.0)
Alkaline Phosphatase: 251 U/L — ABNORMAL HIGH (ref 38–126)
Anion gap: 9 (ref 5–15)
BUN: <5 mg/dL — ABNORMAL LOW (ref 6–20)
CO2: 21 mmol/L — ABNORMAL LOW (ref 22–32)
Calcium: 8.8 mg/dL — ABNORMAL LOW (ref 8.9–10.3)
Chloride: 104 mmol/L (ref 98–111)
Creatinine, Ser: 0.42 mg/dL — ABNORMAL LOW (ref 0.44–1.00)
GFR, Estimated: >60 mL/min (ref >60)
Glucose, Bld: 116 mg/dL — ABNORMAL HIGH (ref 70–99)
Potassium: 3.7 mmol/L (ref 3.5–5.1)
Sodium: 134 mmol/L — ABNORMAL LOW (ref 135–145)
Total Bilirubin: 0.7 mg/dL (ref 0.0–1.2)
Total Protein: 5.7 g/dL — ABNORMAL LOW (ref 6.5–8.1)

## 2023-03-11 LAB — CBC
HCT: 38.6 % (ref 36.0–46.0)
Hemoglobin: 13.5 g/dL (ref 12.0–15.0)
MCH: 32.8 pg (ref 26.0–34.0)
MCHC: 35 g/dL (ref 30.0–36.0)
MCV: 93.9 fL (ref 80.0–100.0)
Platelets: 131 10*3/uL — ABNORMAL LOW (ref 150–400)
RBC: 4.11 MIL/uL (ref 3.87–5.11)
RDW: 13 % (ref 11.5–15.5)
WBC: 8.8 10*3/uL (ref 4.0–10.5)
nRBC: 0 % (ref 0.0–0.2)

## 2023-03-11 LAB — URINALYSIS, ROUTINE W REFLEX MICROSCOPIC
Bilirubin Urine: NEGATIVE
Glucose, UA: NEGATIVE mg/dL
Hgb urine dipstick: NEGATIVE
Ketones, ur: NEGATIVE mg/dL
Leukocytes,Ua: NEGATIVE
Nitrite: NEGATIVE
Protein, ur: NEGATIVE mg/dL
Specific Gravity, Urine: 1.009 (ref 1.005–1.030)
pH: 7 (ref 5.0–8.0)

## 2023-03-11 NOTE — MAU Note (Addendum)
 Terri Ramirez is a 28 y.o. at [redacted]w[redacted]d here in MAU reporting: has been having pains for 2 days. Has been able to sleep, but even when she gets up at night she has them.  They are way worse than the [facility] she had been having.  Sharp pain in low back, and contraction in her pelvis. Denies bleeding or LOF. Onset of complaint: 2days ago Pain score: back 10, pelvis 7 There were no vitals filed for this visit.   ZOX:[id number] to rm, reports +FM Lab orders placed from triage:

## 2023-03-11 NOTE — MAU Provider Note (Addendum)
 History    CSN: [id number]  Arrival date and time: 03/11/23 4098  Event Date/Time  First Provider Initiated Contact with Patient 03/11/23 1949    Chief Complaint  Patient presents with   Back Pain   Contractions   HPI  Tamala Manzer is a 28 y.o. G1P0 at [redacted]w[redacted]d who presents for evaluation of elevated blood pressure. Patient initially presented for contractions and is not in active labor. On arrival, she was found to have elevated blood pressures. She denies any HA, visual changes or epigastric pain. She denies any hx of HTN in the pregnancy. She has A2GDM on metformin.She denies any vaginal bleeding, discharge, and leaking of fluid. Denies any constipation, diarrhea or any urinary complaints. Reports normal fetal movement.   OB History     Gravida  1   Para      Term      Preterm      AB      Living         SAB      IAB      Ectopic      Multiple      Live Births              Past Medical History:  Diagnosis Date   Anxiety    Gestational diabetes    Low blood pressure    Low sodium levels     History reviewed. No pertinent surgical history.  Family History  Adopted: Yes  Family history unknown: Yes    Social History   Tobacco Use   Smoking status: Never   Smokeless tobacco: Never  Vaping Use   Vaping status: Never Used  Substance Use Topics   Alcohol use: Not Currently   Drug use: Not Currently    Types: Marijuana    Comment: none during preg    Allergies: No Known Allergies  Medications Prior to Admission  Medication Sig Dispense Refill Last Dose/Taking   metFORMIN (GLUCOPHAGE) 500 MG tablet Take 500 mg by mouth 2 (two) times daily with a meal.   03/11/2023 Morning   Prenatal Vit-Fe Fumarate-FA (PRENATAL MULTIVITAMIN) TABS tablet Take 1 tablet by mouth daily at 12 noon.   03/11/2023 Morning   cholecalciferol (VITAMIN D3) 25 MCG (1000 UNIT) tablet Take 1,000 Units by mouth daily.      escitalopram (LEXAPRO) 10 MG tablet TAKE 1 TABLET  BY MOUTH EVERY DAY 90 tablet 1    miconazole (MICOTIN) 200 MG vaginal suppository Place 1 suppository (200 mg total) vaginally at bedtime. (Patient not taking: Reported on 12/09/2022) 3 suppository 0    ondansetron (ZOFRAN-ODT) 4 MG disintegrating tablet Take 1 tablet (4 mg total) by mouth every 6 (six) hours as needed for nausea. 20 tablet 0    oxycodone (OXY-IR) 5 MG capsule Take 1 capsule (5 mg total) by mouth every 4 (four) hours as needed for pain (headache). 2 capsule 0    promethazine (PHENERGAN) 25 MG tablet Take 1 tablet (25 mg total) by mouth every 6 (six) hours as needed for nausea or vomiting. 30 tablet 2     Review of Systems  Constitutional: Negative.  Negative for fatigue and fever.  HENT: Negative.    Respiratory: Negative.  Negative for shortness of breath.   Cardiovascular: Negative.  Negative for chest pain.  Gastrointestinal: Negative.  Negative for abdominal pain, constipation, diarrhea, nausea and vomiting.  Genitourinary: Negative.  Negative for dysuria.  Neurological: Negative.  Negative for dizziness and headaches.   Physical Exam  Blood pressure 136/87, pulse 88, temperature (!) 97.1 F (36.2 C), resp. rate 18, height 5\' 3"  (1.6 m), weight 75.9 kg, last menstrual period 06/28/2022.  Patient Vitals for the past 24 hrs:  BP Temp Pulse Resp Height Weight  03/11/23 2100 124/78 -- 91 -- -- --  03/11/23 2045 129/79 -- 81 -- -- --  03/11/23 2030 128/81 -- 83 -- -- --  03/11/23 2015 134/88 -- 89 -- -- --  03/11/23 2000 (!) 136/91 -- 86 -- -- --  03/11/23 1945 136/87 -- 88 -- -- --  03/11/23 1935 132/89 -- 88 -- -- --  03/11/23 1904 (!) 132/91 (!) 97.1 F (36.2 C) 77 18 -- --  03/11/23 1851 -- -- -- -- 5\' 3"  (1.6 m) 75.9 kg    Physical Exam Vitals and nursing note reviewed.  Constitutional:      General: She is not in acute distress.    Appearance: She is well-developed.  HENT:     Head: Normocephalic.  Eyes:     Pupils: Pupils are equal, round, and  reactive to light.  Cardiovascular:     Rate and Rhythm: Normal rate and regular rhythm.     Heart sounds: Normal heart sounds.  Pulmonary:     Effort: Pulmonary effort is normal. No respiratory distress.     Breath sounds: Normal breath sounds.  Abdominal:     General: Bowel sounds are normal. There is no distension.     Palpations: Abdomen is soft.     Tenderness: There is no abdominal tenderness.  Skin:    General: Skin is warm and dry.  Neurological:     Mental Status: She is alert and oriented to person, place, and time.     Motor: No abnormal muscle tone.     Coordination: Coordination normal.     Deep Tendon Reflexes: Reflexes are normal and symmetric. Reflexes normal.  Psychiatric:        Behavior: Behavior normal.        Thought Content: Thought content normal.        Judgment: Judgment normal.     Fetal Tracing:  Baseline: 125 Variability: moderate Accels: 15x15 Decels: none  Toco: occasional uc's  Dilation: Fingertip Effacement (%): 50 Station: -2 Presentation: Vertex Exam by:: Boone Master, RN   MAU Course  Procedures  Results for orders placed or performed during the hospital encounter of 03/11/23 (from the past 24 hours)  Urinalysis, Routine w reflex microscopic -Urine, Clean Catch     Status: Abnormal   Collection Time: 03/11/23  7:49 PM  Result Value Ref Range   Color, Urine YELLOW YELLOW   APPearance HAZY (A) CLEAR   Specific Gravity, Urine 1.009 1.005 - 1.030   pH 7.0 5.0 - 8.0   Glucose, UA NEGATIVE NEGATIVE mg/dL   Hgb urine dipstick NEGATIVE NEGATIVE   Bilirubin Urine NEGATIVE NEGATIVE   Ketones, ur NEGATIVE NEGATIVE mg/dL   Protein, ur NEGATIVE NEGATIVE mg/dL   Nitrite NEGATIVE NEGATIVE   Leukocytes,Ua NEGATIVE NEGATIVE  CBC     Status: Abnormal   Collection Time: 03/11/23  8:27 PM  Result Value Ref Range   WBC 8.8 4.0 - 10.5 K/uL   RBC 4.11 3.87 - 5.11 MIL/uL   Hemoglobin 13.5 12.0 - 15.0 g/dL   HCT 78.2 95.6 - 21.3 %   MCV 93.9  80.0 - 100.0 fL   MCH 32.8 26.0 - 34.0 pg   MCHC 35.0 30.0 - 36.0 g/dL   RDW 08.6 57.8 -  15.5 %   Platelets 131 (L) 150 - 400 K/uL   nRBC 0.0 0.0 - 0.2 %     MDM Prenatal records from community office reviewed. Pregnancy complicated by A2GDM  Labs ordered and reviewed.   Next OB appointment 2/26 at [facility]  UA CBC, CMP Protein/creat ratio  Care turned over to Dr. Alvester Morin at 2100. Rolm Bookbinder, CNM 03/11/23   2100 Assumed care form CNM. Federico Flake, MD  21:30 reviewed labs and chart. OK for discharge Assessment and Plan   1. Elevated blood pressure complicating pregnancy in third trimester, antepartum   2. Gestational diabetes mellitus (GDM) controlled on oral hypoglycemic drug, antepartum   3. NST (non-stress test) reactive   4. [redacted] weeks gestation of pregnancy    - dc home with return precautions - letter for work provided - Has follow up tomorrow at [employer], MD, MPH, ABFM, [facility]

## 2023-03-11 NOTE — Discharge Instructions (Signed)
 You were seen in the MAU and found to have elevated blood pressures. Your labs were negative for signs of preeclampsia.   Please return if you have a - Headache that does not improve with tylenol  - Changes in your vision, seeing spots/sparkles/dots  - Pain in the upper right side of your belly - Sudden worsening swelling in your feet, hands or face  We recommend you discuss your blood pressure with your OB Provider

## 2023-03-12 ENCOUNTER — Telehealth (HOSPITAL_COMMUNITY): Payer: Self-pay | Admitting: *Deleted

## 2023-03-12 DIAGNOSIS — Z3A38 38 weeks gestation of pregnancy: Secondary | ICD-10-CM | POA: Diagnosis not present

## 2023-03-12 DIAGNOSIS — F4312 Post-traumatic stress disorder, chronic: Secondary | ICD-10-CM | POA: Diagnosis not present

## 2023-03-12 DIAGNOSIS — Z364 Encounter for antenatal screening for fetal growth retardation: Secondary | ICD-10-CM | POA: Diagnosis not present

## 2023-03-12 NOTE — Telephone Encounter (Signed)
 Patient response to previous message as FYI/update

## 2023-03-12 NOTE — Telephone Encounter (Signed)
 Preadmission screen

## 2023-03-13 ENCOUNTER — Encounter (HOSPITAL_COMMUNITY): Payer: Self-pay | Admitting: Obstetrics and Gynecology

## 2023-03-13 ENCOUNTER — Encounter (HOSPITAL_COMMUNITY): Payer: Self-pay

## 2023-03-13 ENCOUNTER — Inpatient Hospital Stay (HOSPITAL_COMMUNITY)
Admission: AD | Admit: 2023-03-13 | Discharge: 2023-03-17 | DRG: 806 | Disposition: A | Discharge status: Home / Self Care | Payer: BC Managed Care – PPO | Attending: Obstetrics and Gynecology | Admitting: Obstetrics and Gynecology

## 2023-03-13 DIAGNOSIS — A6000 Herpesviral infection of urogenital system, unspecified: Secondary | ICD-10-CM | POA: Diagnosis present

## 2023-03-13 DIAGNOSIS — O24425 Gestational diabetes mellitus in childbirth, controlled by oral hypoglycemic drugs: Secondary | ICD-10-CM | POA: Diagnosis present

## 2023-03-13 DIAGNOSIS — O134 Gestational [pregnancy-induced] hypertension without significant proteinuria, complicating childbirth: Principal | ICD-10-CM | POA: Diagnosis present

## 2023-03-13 DIAGNOSIS — O133 Gestational [pregnancy-induced] hypertension without significant proteinuria, third trimester: Secondary | ICD-10-CM | POA: Diagnosis present

## 2023-03-13 DIAGNOSIS — O24419 Gestational diabetes mellitus in pregnancy, unspecified control: Principal | ICD-10-CM | POA: Diagnosis present

## 2023-03-13 DIAGNOSIS — Z3A38 38 weeks gestation of pregnancy: Secondary | ICD-10-CM

## 2023-03-13 DIAGNOSIS — O9832 Other infections with a predominantly sexual mode of transmission complicating childbirth: Secondary | ICD-10-CM | POA: Diagnosis present

## 2023-03-13 LAB — URINALYSIS, ROUTINE W REFLEX MICROSCOPIC
Bilirubin Urine: NEGATIVE
Glucose, UA: NEGATIVE mg/dL
Hgb urine dipstick: NEGATIVE
Ketones, ur: NEGATIVE mg/dL
Leukocytes,Ua: NEGATIVE
Nitrite: NEGATIVE
Protein, ur: NEGATIVE mg/dL
Specific Gravity, Urine: 1.008 (ref 1.005–1.030)
pH: 7 (ref 5.0–8.0)

## 2023-03-13 LAB — COMPREHENSIVE METABOLIC PANEL
ALT: 23 U/L (ref 0–44)
AST: 38 U/L (ref 15–41)
Albumin: 2.7 g/dL — ABNORMAL LOW (ref 3.5–5.0)
Alkaline Phosphatase: 307 U/L — ABNORMAL HIGH (ref 38–126)
Anion gap: 14 (ref 5–15)
BUN: 5 mg/dL — ABNORMAL LOW (ref 6–20)
CO2: 18 mmol/L — ABNORMAL LOW (ref 22–32)
Calcium: 8.9 mg/dL (ref 8.9–10.3)
Chloride: 100 mmol/L (ref 98–111)
Creatinine, Ser: 0.61 mg/dL (ref 0.44–1.00)
GFR, Estimated: >60 mL/min (ref >60)
Glucose, Bld: 280 mg/dL — ABNORMAL HIGH (ref 70–99)
Potassium: 3.8 mmol/L (ref 3.5–5.1)
Sodium: 132 mmol/L — ABNORMAL LOW (ref 135–145)
Total Bilirubin: 0.8 mg/dL (ref 0.0–1.2)
Total Protein: 6 g/dL — ABNORMAL LOW (ref 6.5–8.1)

## 2023-03-13 LAB — PROTEIN / CREATININE RATIO, URINE
Creatinine, Urine: 34 mg/dL
Protein Creatinine Ratio: 0.21 mg/mg{creat} — ABNORMAL HIGH (ref 0.00–0.15)
Total Protein, Urine: 7 mg/dL

## 2023-03-13 LAB — CBC
HCT: 39.9 % (ref 36.0–46.0)
Hemoglobin: 14 g/dL (ref 12.0–15.0)
MCH: 33.2 pg (ref 26.0–34.0)
MCHC: 35.1 g/dL (ref 30.0–36.0)
MCV: 94.5 fL (ref 80.0–100.0)
Platelets: 134 10*3/uL — ABNORMAL LOW (ref 150–400)
RBC: 4.22 MIL/uL (ref 3.87–5.11)
RDW: 12.9 % (ref 11.5–15.5)
WBC: 9.4 10*3/uL (ref 4.0–10.5)
nRBC: 0 % (ref 0.0–0.2)

## 2023-03-13 LAB — TYPE AND SCREEN
ABO/RH(D): O POS
Antibody Screen: NEGATIVE

## 2023-03-13 MED ORDER — ACETAMINOPHEN-CAFFEINE 500-65 MG PO TABS
2.0000 | ORAL_TABLET | Freq: Once | ORAL | Status: AC
  Administered 2023-03-13: 2 via ORAL
  Filled 2023-03-13: qty 2

## 2023-03-13 MED ORDER — METOCLOPRAMIDE HCL 5 MG/ML IJ SOLN: 10.0000 mg | Freq: Once | INTRAMUSCULAR | Status: DC

## 2023-03-13 MED ORDER — DIPHENHYDRAMINE HCL 50 MG/ML IJ SOLN: 25.0000 mg | Freq: Once | INTRAMUSCULAR | Status: DC

## 2023-03-13 MED ORDER — LACTATED RINGERS IV SOLN: 500.0000 mL | INTRAVENOUS | Status: AC | PRN

## 2023-03-13 MED ORDER — LACTATED RINGERS IV BOLUS
1000.0000 mL | Freq: Once | INTRAVENOUS | Status: AC
  Administered 2023-03-13: 1000 mL via INTRAVENOUS

## 2023-03-13 NOTE — MAU Note (Signed)
..  Terri Ramirez is a 28 y.o. at [redacted]w[redacted]d here in MAU reporting: elevated blood pressure at home 140's/100's Headache that comes and goes that began 3 days ago. Has not taken any medication for the pain.  Blurry vision that began yesterday  Was recommended to come in for an induction yesterday but patient requested a few more days (she has 5 more days of work) +FM Denies vaginal bleeding, contractions, or leaking of fluid.  Pain score: 7/10 Vitals:   03/13/23 2215  BP: (!) 141/84  Pulse: (!) 108  Resp: 17  Temp: (!) 97.5 F (36.4 C)  SpO2: 100%     FHT:130 Lab orders placed from triage:   UA

## 2023-03-13 NOTE — MAU Provider Note (Addendum)
 History     CSN: [id number]  Arrival date and time: 03/13/23 2152   None     Chief Complaint  Patient presents with   Headache   Terri Ramirez is a 28 y.o. G1P0 at [redacted]w[redacted]d who receives care at [facility].  She presents today for headache and elevated blood pressure.  Patient states her HA has been present for ~ 3 days and she has not attempted to treat it.  She reports the headache is frontal and describes it as constant pain that "feels like a migraine."  Patient denies h/o headaches or migraines. Patient denies relieving or worsening factors for the HA and rates it a 7/10.  Patient also reports elevated bp at home of 140/100 and previous from 133-141/91-103 since last visit on Tuesday 2/25.  Patient states she was suppose to come in for induction, but was fearful and also felt she "needed to work 5 more days for a full paycheck."  However, patient is considering admission tonight.   Patient endorses fetal movement and denies vaginal concerns.  She does report some blurry vision that started last night, but contributes to her corrective lenses. She states she was seen in the office yesterday and was 2cm.    OB History     Gravida  1   Para      Term      Preterm      AB      Living         SAB      IAB      Ectopic      Multiple      Live Births              Past Medical History:  Diagnosis Date   Anxiety    Gestational diabetes    Low blood pressure    Low sodium levels     No past surgical history on file.  Family History  Adopted: Yes  Family history unknown: Yes    Social History   Tobacco Use   Smoking status: Never   Smokeless tobacco: Never  Vaping Use   Vaping status: Never Used  Substance Use Topics   Alcohol use: Not Currently   Drug use: Not Currently    Types: Marijuana    Comment: none during preg    Allergies: No Known Allergies  Medications Prior to Admission  Medication Sig Dispense Refill Last Dose/Taking   cholecalciferol  (VITAMIN D3) 25 MCG (1000 UNIT) tablet Take 1,000 Units by mouth daily.      escitalopram (LEXAPRO) 10 MG tablet TAKE 1 TABLET BY MOUTH EVERY DAY 90 tablet 1    metFORMIN (GLUCOPHAGE) 500 MG tablet Take 500 mg by mouth 2 (two) times daily with a meal.      ondansetron (ZOFRAN-ODT) 4 MG disintegrating tablet Take 1 tablet (4 mg total) by mouth every 6 (six) hours as needed for nausea. 20 tablet 0    oxycodone (OXY-IR) 5 MG capsule Take 1 capsule (5 mg total) by mouth every 4 (four) hours as needed for pain (headache). 2 capsule 0    Prenatal Vit-Fe Fumarate-FA (PRENATAL MULTIVITAMIN) TABS tablet Take 1 tablet by mouth daily at 12 noon.      promethazine (PHENERGAN) 25 MG tablet Take 1 tablet (25 mg total) by mouth every 6 (six) hours as needed for nausea or vomiting. 30 tablet 2     Review of Systems  Constitutional:  Negative for chills and fever.  Gastrointestinal:  Negative for nausea and vomiting.  Genitourinary:  Negative for difficulty urinating, dysuria, vaginal bleeding and vaginal discharge.  Neurological:  Positive for headaches. Negative for dizziness and light-headedness.   Physical Exam   Blood pressure (!) 141/84, pulse (!) 108, temperature (!) 97.5 F (36.4 C), temperature source Oral, resp. rate 17, height 5\' 3"  (1.6 m), weight 76.5 kg, last menstrual period 06/28/2022, SpO2 100%. Vitals:   03/13/23 2215 03/13/23 2240 03/13/23 2245 03/13/23 2300  BP: (!) 141/84 (!) 147/99 139/84 136/85   Physical Exam Vitals reviewed.  Constitutional:      Appearance: She is well-developed.  HENT:     Head: Normocephalic and atraumatic.  Eyes:     Conjunctiva/sclera: Conjunctivae normal.  Cardiovascular:     Rate and Rhythm: Tachycardia present.  Pulmonary:     Effort: Pulmonary effort is normal. No respiratory distress.  Abdominal:     Palpations: Abdomen is soft.  Musculoskeletal:        General: Normal range of motion.     Cervical back: Normal range of motion. No rigidity.   Skin:    General: Skin is warm and dry.  Neurological:     Mental Status: She is alert and oriented to person, place, and time.  Psychiatric:        Mood and Affect: Mood normal.        Behavior: Behavior normal.     Fetal Assessment 135 bpm, Mod Var, -Decels, +15x15 Accels Toco: No ctx graphed  MAU Course   Results for orders placed or performed during the hospital encounter of 03/13/23 (from the past 24 hours)  Urinalysis, Routine w reflex microscopic -Urine, Clean Catch     Status: None   Collection Time: 03/13/23 10:10 PM  Result Value Ref Range   Color, Urine YELLOW YELLOW   APPearance CLEAR CLEAR   Specific Gravity, Urine 1.008 1.005 - 1.030   pH 7.0 5.0 - 8.0   Glucose, UA NEGATIVE NEGATIVE mg/dL   Hgb urine dipstick NEGATIVE NEGATIVE   Bilirubin Urine NEGATIVE NEGATIVE   Ketones, ur NEGATIVE NEGATIVE mg/dL   Protein, ur NEGATIVE NEGATIVE mg/dL   Nitrite NEGATIVE NEGATIVE   Leukocytes,Ua NEGATIVE NEGATIVE  Protein / creatinine ratio, urine     Status: Abnormal   Collection Time: 03/13/23 10:20 PM  Result Value Ref Range   Creatinine, Urine 34 mg/dL   Total Protein, Urine 7 mg/dL   Protein Creatinine Ratio 0.21 (H) 0.00 - 0.15 mg/mg[Cre]  CBC     Status: Abnormal   Collection Time: 03/13/23 10:44 PM  Result Value Ref Range   WBC 9.4 4.0 - 10.5 K/uL   RBC 4.22 3.87 - 5.11 MIL/uL   Hemoglobin 14.0 12.0 - 15.0 g/dL   HCT 16.1 09.6 - 04.5 %   MCV 94.5 80.0 - 100.0 fL   MCH 33.2 26.0 - 34.0 pg   MCHC 35.1 30.0 - 36.0 g/dL   RDW 40.9 81.1 - 91.4 %   Platelets 134 (L) 150 - 400 K/uL   nRBC 0.0 0.0 - 0.2 %  Type and screen [facility]     Status: None (Preliminary result)   Collection Time: 03/13/23 10:44 PM  Result Value Ref Range   ABO/RH(D) PENDING    Antibody Screen PENDING    Sample Expiration      03/16/2023,2359 Performed at [facility], [address], [place], [place] [address]    No results found.  MDM Physical  Exam Labs: CBC, CMP,  PC Ratio Measure BPQ15 min EFM Pain Management Consult Assessment and Plan  28 year old G1P0  SIUP at 38.2 weeks Cat I FT GHTN vs PreE HA  -Discussed strong recommendation for IOL. -Reviewed patient fears and concerns. -Discussed induction methods including cervical ripening agents, foley bulbs, and pitocin -Patient verbalizes understanding and wishes to proceed with induction process -Exam performed. -Will start IV and collect labs.  -Excedrin to be given for HA. -Will contact primary ob for admission orders.   Cherre Robins MSN, CNM 03/13/2023, 10:19 PM   Reassessment (11:02 PM) Dr. Kathie Rhodes. Autry-Lott contacted and informed of patient status, evaluation, interventions, and pending results. Advised: *Agree with admission.  -Nurses instructed to place standard admission orders.  -Care relinquished to [facility] as [facility] coverage.   Cherre Robins MSN, CNM Advanced Practice Provider, [facility] [employer]

## 2023-03-14 ENCOUNTER — Other Ambulatory Visit: Payer: Self-pay

## 2023-03-14 ENCOUNTER — Encounter (HOSPITAL_COMMUNITY): Payer: Self-pay | Admitting: Family Medicine

## 2023-03-14 DIAGNOSIS — Z3A38 38 weeks gestation of pregnancy: Secondary | ICD-10-CM | POA: Diagnosis not present

## 2023-03-14 DIAGNOSIS — O24425 Gestational diabetes mellitus in childbirth, controlled by oral hypoglycemic drugs: Secondary | ICD-10-CM | POA: Diagnosis present

## 2023-03-14 DIAGNOSIS — O134 Gestational [pregnancy-induced] hypertension without significant proteinuria, complicating childbirth: Secondary | ICD-10-CM | POA: Diagnosis present

## 2023-03-14 DIAGNOSIS — A6000 Herpesviral infection of urogenital system, unspecified: Secondary | ICD-10-CM | POA: Diagnosis present

## 2023-03-14 DIAGNOSIS — O133 Gestational [pregnancy-induced] hypertension without significant proteinuria, third trimester: Secondary | ICD-10-CM | POA: Diagnosis present

## 2023-03-14 DIAGNOSIS — R030 Elevated blood-pressure reading, without diagnosis of hypertension: Secondary | ICD-10-CM | POA: Diagnosis present

## 2023-03-14 DIAGNOSIS — O9832 Other infections with a predominantly sexual mode of transmission complicating childbirth: Secondary | ICD-10-CM | POA: Diagnosis present

## 2023-03-14 DIAGNOSIS — O24419 Gestational diabetes mellitus in pregnancy, unspecified control: Principal | ICD-10-CM | POA: Diagnosis present

## 2023-03-14 LAB — GLUCOSE, CAPILLARY
Glucose-Capillary: 113 mg/dL — ABNORMAL HIGH (ref 70–99)
Glucose-Capillary: 167 mg/dL — ABNORMAL HIGH (ref 70–99)
Glucose-Capillary: 185 mg/dL — ABNORMAL HIGH (ref 70–99)
Glucose-Capillary: 189 mg/dL — ABNORMAL HIGH (ref 70–99)
Glucose-Capillary: 73 mg/dL (ref 70–99)
Glucose-Capillary: 77 mg/dL (ref 70–99)
Glucose-Capillary: 86 mg/dL (ref 70–99)
Glucose-Capillary: 87 mg/dL (ref 70–99)

## 2023-03-14 LAB — RPR: RPR Ser Ql: NONREACTIVE

## 2023-03-14 MED ORDER — TERBUTALINE SULFATE 1 MG/ML IJ SOLN: 0.2500 mg | Freq: Once | INTRAMUSCULAR | Status: DC | PRN

## 2023-03-14 MED ORDER — LIDOCAINE HCL (PF) 1 % IJ SOLN
30.0000 mL | INTRAMUSCULAR | Status: AC | PRN
  Administered 2023-03-15: 30 mL via SUBCUTANEOUS
  Filled 2023-03-14: qty 30

## 2023-03-14 MED ORDER — OXYCODONE-ACETAMINOPHEN 5-325 MG PO TABS: 1.0000 | ORAL_TABLET | ORAL | Status: DC | PRN

## 2023-03-14 MED ORDER — MISOPROSTOL 25 MCG QUARTER TABLET
25.0000 ug | ORAL_TABLET | Freq: Once | ORAL | Status: AC
  Administered 2023-03-14: 25 ug via VAGINAL
  Filled 2023-03-14: qty 1

## 2023-03-14 MED ORDER — LACTATED RINGERS IV SOLN: INTRAVENOUS | Status: DC

## 2023-03-14 MED ORDER — OXYTOCIN-SODIUM CHLORIDE 30-0.9 UT/500ML-% IV SOLN: 2.5000 [IU]/h | INTRAVENOUS | Status: DC

## 2023-03-14 MED ORDER — OXYCODONE-ACETAMINOPHEN 5-325 MG PO TABS: 2.0000 | ORAL_TABLET | ORAL | Status: DC | PRN

## 2023-03-14 MED ORDER — INSULIN ASPART 100 UNIT/ML IJ SOLN
0.0000 [IU] | INTRAMUSCULAR | Status: DC
  Administered 2023-03-14 (×2): 3 [IU] via SUBCUTANEOUS
  Administered 2023-03-14: 1 [IU] via SUBCUTANEOUS

## 2023-03-14 MED ORDER — ACETAMINOPHEN 325 MG PO TABS: 650.0000 mg | ORAL_TABLET | ORAL | Status: DC | PRN

## 2023-03-14 MED ORDER — FLEET ENEMA RE ENEM: 1.0000 | ENEMA | RECTAL | Status: DC | PRN

## 2023-03-14 MED ORDER — MISOPROSTOL 25 MCG QUARTER TABLET
25.0000 ug | ORAL_TABLET | ORAL | Status: DC | PRN
  Administered 2023-03-14 (×2): 25 ug via VAGINAL
  Filled 2023-03-14 (×2): qty 1

## 2023-03-14 MED ORDER — MISOPROSTOL 25 MCG QUARTER TABLET
25.0000 ug | ORAL_TABLET | Freq: Once | ORAL | Status: AC
  Administered 2023-03-14: 25 ug via ORAL
  Filled 2023-03-14: qty 1

## 2023-03-14 MED ORDER — METFORMIN HCL 500 MG PO TABS: 500.0000 mg | ORAL_TABLET | Freq: Two times a day (BID) | ORAL | Status: DC

## 2023-03-14 MED ORDER — SOD CITRATE-CITRIC ACID 500-334 MG/5ML PO SOLN: 30.0000 mL | ORAL | Status: DC | PRN

## 2023-03-14 MED ORDER — LACTATED RINGERS IV SOLN
500.0000 mL | INTRAVENOUS | Status: DC | PRN
  Administered 2023-03-14: 500 mL via INTRAVENOUS

## 2023-03-14 MED ORDER — OXYTOCIN-SODIUM CHLORIDE 30-0.9 UT/500ML-% IV SOLN
1.0000 m[IU]/min | INTRAVENOUS | Status: DC
  Administered 2023-03-14: 2 m[IU]/min via INTRAVENOUS
  Filled 2023-03-14: qty 500

## 2023-03-14 MED ORDER — ONDANSETRON HCL 4 MG/2ML IJ SOLN: 4.0000 mg | Freq: Four times a day (QID) | INTRAMUSCULAR | Status: DC | PRN

## 2023-03-14 MED ORDER — FENTANYL CITRATE (PF) 100 MCG/2ML IJ SOLN
50.0000 ug | INTRAMUSCULAR | Status: DC | PRN
  Administered 2023-03-14 (×2): 100 ug via INTRAVENOUS
  Filled 2023-03-14 (×3): qty 2

## 2023-03-14 MED ORDER — OXYTOCIN BOLUS FROM INFUSION
333.0000 mL | Freq: Once | INTRAVENOUS | Status: AC
  Administered 2023-03-15: 333 mL via INTRAVENOUS

## 2023-03-14 NOTE — Progress Notes (Signed)
 Subjective:    Doing well with contractions. Discussed amniotomy and pt agrees.   Objective:    VS: BP 128/78   Pulse (!) 59   Temp 98.3 F (36.8 C) (Oral)   Resp 17   Ht 5\' 3"  (1.6 m)   Wt 76.5 kg   LMP 06/28/2022 (Approximate)   SpO2 100%   BMI 29.87 kg/m  FHR : baseline 130 / variability moderate / accelerations present / eraly decelerations Toco: contractions every 2-3 minutes  Membranes: AROM, clear Dilation: 4 Effacement (%): 70 Station: -2 Presentation: Vertex Exam by:: J.Cox, RN Pitocin reduced from 14 to 6 mU/min CBG (last 3)  Recent Labs    03/14/23 1210 03/14/23 1601 03/14/23 1931  GLUCAP 86 113* 77     Assessment/Plan:   28 y.o. G1P0 [redacted]w[redacted]d IOL for A2DM    -continue CBG Q4 hrs    -continue sliding scale insulin   Labor:  S/P Cytotec, Cook balloon, now AROM and Pitocin Preeclampsia:  no signs or symptoms of toxicity, intake and ouput balanced, and labs stable Fetal Wellbeing:  Category I Pain Control:   plans to use nitrous gas I/D:   GBS neg Anticipated MOD:  NSVD  Roma Schanz DNP, CNM 03/14/2023 8:40 PM

## 2023-03-14 NOTE — Progress Notes (Signed)
 Subjective:    Discussed mechanical ripening and answered questions. Pt agrees to balloon placement.   Objective:    VS: BP 128/78   Pulse (!) 59   Temp 98.3 F (36.8 C) (Oral)   Resp 17   Ht 5\' 3"  (1.6 m)   Wt 76.5 kg   LMP 06/28/2022 (Approximate)   SpO2 100%   BMI 29.87 kg/m  FHR : baseline 130 / variability moderate / accelerations present / absent decelerations Toco: contractions every 2-5 minutes  Membranes: intact Dilation: 5 Effacement (%): 70 Cervical Position: Middle Station: -3 Presentation: Vertex Exam by:: Rhea Pink CNM   Assessment/Plan:   28 y.o. G1P0 [redacted]w[redacted]d IOL for A2DM    -Metformin dc'd    -continue Q 4hr CBG    -continue sliding scale insulin PRN GHTN Labor:  S/P Cytotec x1 dual dose, Cook balloon placed w/ 60 ml vaginal and 80 ml uterine Fetal Wellbeing:  Category I Pain Control:   plans nitrous gas I/D:   GBS neg Anticipated MOD:  NSVD  Roma Schanz DNP, CNM 03/14/2023 9:02 PM

## 2023-03-14 NOTE — Inpatient Diabetes Management (Signed)
 Inpatient Diabetes Program Recommendations  Diabetes Treatment Program Recommendations  ADA Standards of Care Diabetes in Pregnancy Target Glucose Ranges:  Fasting: 70 - 95 mg/dL 1 hr postprandial: Less than 140mg /dL (from first bite of meal) 2 hr postprandial: Less than 120 mg/dL (from first bite of meal)      Latest Reference Range & Units 03/14/23 00:07 03/14/23 01:37 03/14/23 03:32  Glucose-Capillary 70 - 99 mg/dL 696 (H) 295 (H) 87   Review of Glycemic Control  Diabetes history: GDM; [redacted]w[redacted]d Outpatient Diabetes medications: Metformin 500 mg BID Current orders for Inpatient glycemic control: Novolog 0-14 units Q4H  Inpatient Diabetes Program Recommendations:    Insulin: IF CBGs become consistently over 120 mg/dl, recommend to discontinue Novolog correction and order IV insulin.  NOTE: Patient admitted 03/14/23 for IOL. Per chart review, patient has GDM and takes Metformin 500 mg BID for glycemic control. Novolog correction Q4H ordered and most current CBG 87 mg/dl at 2:84 am today.   Thanks, Orlando Penner, RN, MSN, CDCES Diabetes Coordinator Inpatient Diabetes Program [phone] (Team Pager from 8am to 5pm)

## 2023-03-14 NOTE — H&P (Signed)
 OB ADMISSION/ HISTORY & PHYSICAL:  Admission Date: 03/13/2023  9:54 PM  Admit Diagnosis: A2DM and GHTN  Latoya Maulding is a 28 y.o. female G1P0 [redacted]w[redacted]d presenting for eval for headache and elevated blood pressure. Endorses active FM, denies LOF and vaginal bleeding. Pregnancy complicated by gestational diabetes managed on Metformin started at 31 weeks. New onset gestational hypertension dx after evaluation in MAU.   History of current pregnancy: G1P0   Patient entered care with [facility] at 11+3 wks.   EDC 03/25/23 by LMP and congruent w/ 1st trimester   Anatomy scan:  complete w/ posterior placenta.   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    Prenatal Labs: ABO, Rh: --/--/O POS (02/27 2244) Antibody: NEG (02/27 2244) Rubella: Immune (08/23 0000)  RPR: NON REACTIVE (02/27 2244)  HBsAg: Negative (08/23 0000)  HIV: Non-reactive (08/23 0000)  GTT: abnormal 3 hr GBS: Negative/-- (02/10 0000)  GC/CHL: neg/neg Genetics: low-risk  Vaccines: Tdap: declined Influenza: declined   OB History  Gravida Para Term Preterm AB Living  1       SAB IAB Ectopic Multiple Live Births          # Outcome Date GA Lbr Len/2nd Weight Sex Type Anes PTL Lv  1 Current             Medical / Surgical History: Past medical history:  Past Medical History:  Diagnosis Date   Anxiety    Gestational diabetes    Low blood pressure    Low sodium levels     Past surgical history: History reviewed. No pertinent surgical history. Family History:  Family History  Adopted: Yes  Family history unknown: Yes    Social History:  reports that she has never smoked. She has never used smokeless tobacco. She reports  that she does not currently use alcohol. She reports that she does not currently use drugs after having used the following drugs: Marijuana.  Allergies: Patient has no known allergies.   Current Medications at time of admission:  Prior to Admission medications   Medication Sig Start Date End Date Taking? Authorizing Provider  cholecalciferol (VITAMIN D3) 25 MCG (1000 UNIT) tablet Take 1,000 Units by mouth daily.   Yes [provider]  metFORMIN (GLUCOPHAGE) 500 MG tablet Take 500 mg by mouth 2 (two) times daily with a meal.   Yes [provider]  oxycodone (OXY-IR) 5 MG capsule Take 1 capsule (5 mg total) by mouth every 4 (four) hours as needed for pain (headache). 01/22/23  Yes Aviva Signs, CNM  Prenatal Vit-Fe Fumarate-FA (PRENATAL MULTIVITAMIN) TABS tablet Take 1 tablet by mouth daily at 12 noon.   Yes [provider]  promethazine (PHENERGAN) 25 MG tablet Take 1 tablet (25 mg total) by mouth every 6 (six) hours as needed for nausea or vomiting. 01/22/23  Yes Aviva Signs, CNM  escitalopram (LEXAPRO) 10 MG tablet TAKE 1 TABLET BY MOUTH EVERY DAY 06/18/22   Allwardt, Alyssa M, PA-C  ondansetron (ZOFRAN-ODT) 4 MG disintegrating tablet Take 1 tablet (4 mg total) by mouth every 6 (six) hours as needed for nausea. 01/22/23   Aviva Signs, CNM    Review of Systems: Constitutional: Negative  HENT: Negative   Eyes: Negative   Respiratory: Negative   Cardiovascular: Negative   Gastrointestinal: Negative  Genitourinary: neg for bloody show, neg for LOF   Musculoskeletal: Negative   Skin: Negative   Neurological: Negative   Endo/Heme/Allergies: Negative   Psychiatric/Behavioral: Negative    Physical Exam: VS: Blood pressure 128/78, pulse (!) 59, temperature 98.3 F (36.8 C), temperature source Oral, resp. rate 17, height 5\' 3"  (1.6 m), weight 76.5 kg, last menstrual period 06/28/2022, SpO2 100%. AAO x3, no signs of distress Cardiovascular:  RRR Respiratory: Unlabored GU/GI: Abdomen gravid, non-tender, non-distended, active FM, vertex Extremities: no edema, negative for pain, tenderness, and cords  Cervical exam:Dilation: 1 Effacement (%): thick Station: -3 Exam by:: Rhea Pink CNM FHR: baseline rate 130 / variability moderate / accelerations prsent / absent decelerations TOCO: none   Prenatal Transfer Tool  Maternal Diabetes: Yes:  Diabetes Type:  Insulin/Medication controlled Genetic Screening: Normal Maternal Ultrasounds/Referrals: Normal Fetal Ultrasounds or other Referrals:  None Maternal Substance Abuse:  No Significant Maternal Medications:  Meds include: Other: Metformin Significant Maternal Lab Results: Group B Strep negative Number of Prenatal Visits:greater than 3 verified prenatal visits Maternal Vaccinations: declined all Other Comments:  None    Assessment: 28 y.o. G1P0 [redacted]w[redacted]d  Induction of labor for A2DM and GHTN FHR category 1 GBS neg Pain management plan: nitrous gas   Plan:  Admit to L&D Routine admission orders Epidural PRN Ripening with Cytotec 25 mcg vaginal Dr Normand Sloop notified of admission and plan of care  Roma Schanz DNP, CNM 03/14/2023 9:06 PM

## 2023-03-15 ENCOUNTER — Encounter (HOSPITAL_COMMUNITY): Payer: Self-pay | Admitting: Obstetrics and Gynecology

## 2023-03-15 LAB — CBC
HCT: 34.8 % — ABNORMAL LOW (ref 36.0–46.0)
Hemoglobin: 12.5 g/dL (ref 12.0–15.0)
MCH: 33.4 pg (ref 26.0–34.0)
MCHC: 35.9 g/dL (ref 30.0–36.0)
MCV: 93 fL (ref 80.0–100.0)
Platelets: 121 10*3/uL — ABNORMAL LOW (ref 150–400)
RBC: 3.74 MIL/uL — ABNORMAL LOW (ref 3.87–5.11)
RDW: 12.7 % (ref 11.5–15.5)
WBC: 16.1 10*3/uL — ABNORMAL HIGH (ref 4.0–10.5)
nRBC: 0 % (ref 0.0–0.2)

## 2023-03-15 LAB — GLUCOSE, CAPILLARY: Glucose-Capillary: 101 mg/dL — ABNORMAL HIGH (ref 70–99)

## 2023-03-15 MED ORDER — PRENATAL MULTIVITAMIN CH
1.0000 | ORAL_TABLET | Freq: Every day | ORAL | Status: DC
Start: 2023-03-15 — End: 2023-03-17
  Administered 2023-03-15 – 2023-03-17 (×3): 1 via ORAL
  Filled 2023-03-15 (×3): qty 1

## 2023-03-15 MED ORDER — DIBUCAINE (PERIANAL) 1 % EX OINT
1.0000 | TOPICAL_OINTMENT | CUTANEOUS | Status: DC | PRN
Start: 2023-03-15 — End: 2023-03-17

## 2023-03-15 MED ORDER — SIMETHICONE 80 MG PO CHEW: 80.0000 mg | CHEWABLE_TABLET | ORAL | Status: DC | PRN

## 2023-03-15 MED ORDER — ZOLPIDEM TARTRATE 5 MG PO TABS: 5.0000 mg | ORAL_TABLET | Freq: Every evening | ORAL | Status: DC | PRN

## 2023-03-15 MED ORDER — COCONUT OIL OIL
1.0000 | TOPICAL_OIL | Status: DC | PRN
  Administered 2023-03-16: 1 via TOPICAL

## 2023-03-15 MED ORDER — BENZOCAINE-MENTHOL 20-0.5 % EX AERO: 1.0000 | INHALATION_SPRAY | CUTANEOUS | Status: DC | PRN

## 2023-03-15 MED ORDER — TETANUS-DIPHTH-ACELL PERTUSSIS 5-2.5-18.5 LF-MCG/0.5 IM SUSY: 0.5000 mL | PREFILLED_SYRINGE | Freq: Once | INTRAMUSCULAR | Status: DC

## 2023-03-15 MED ORDER — ACETAMINOPHEN 325 MG PO TABS
650.0000 mg | ORAL_TABLET | Freq: Four times a day (QID) | ORAL | Status: DC
  Administered 2023-03-15 – 2023-03-17 (×9): 650 mg via ORAL
  Filled 2023-03-15 (×9): qty 2

## 2023-03-15 MED ORDER — SENNOSIDES-DOCUSATE SODIUM 8.6-50 MG PO TABS
2.0000 | ORAL_TABLET | ORAL | Status: DC
Start: 2023-03-15 — End: 2023-03-17
  Administered 2023-03-15 – 2023-03-17 (×3): 2 via ORAL
  Filled 2023-03-15 (×3): qty 2

## 2023-03-15 MED ORDER — WITCH HAZEL-GLYCERIN EX PADS: 1.0000 | MEDICATED_PAD | CUTANEOUS | Status: DC | PRN

## 2023-03-15 MED ORDER — ONDANSETRON HCL 4 MG/2ML IJ SOLN: 4.0000 mg | INTRAMUSCULAR | Status: DC | PRN

## 2023-03-15 MED ORDER — IBUPROFEN 600 MG PO TABS
600.0000 mg | ORAL_TABLET | Freq: Four times a day (QID) | ORAL | Status: DC
  Administered 2023-03-15 – 2023-03-17 (×10): 600 mg via ORAL
  Filled 2023-03-15 (×9): qty 1

## 2023-03-15 MED ORDER — ONDANSETRON HCL 4 MG PO TABS: 4.0000 mg | ORAL_TABLET | ORAL | Status: DC | PRN

## 2023-03-15 MED ORDER — DIPHENHYDRAMINE HCL 25 MG PO CAPS: 25.0000 mg | ORAL_CAPSULE | Freq: Four times a day (QID) | ORAL | Status: DC | PRN

## 2023-03-15 NOTE — Plan of Care (Signed)
  Problem: Education: Goal: Knowledge of Childbirth will improve Outcome: Completed/Met Goal: Ability to make informed decisions regarding treatment and plan of care will improve Outcome: Completed/Met Goal: Ability to state and carry out methods to decrease the pain will improve Outcome: Completed/Met Goal: Individualized Educational Video(s) Outcome: Completed/Met   Problem: Coping: Goal: Ability to verbalize concerns and feelings about labor and delivery will improve Outcome: Completed/Met   Problem: Life Cycle: Goal: Ability to make normal progression through stages of labor will improve Outcome: Completed/Met Goal: Ability to effectively push during vaginal delivery will improve Outcome: Completed/Met   Problem: Role Relationship: Goal: Will demonstrate positive interactions with the child Outcome: Completed/Met   Problem: Safety: Goal: Risk of complications during labor and delivery will decrease Outcome: Completed/Met   Problem: Pain Management: Goal: Relief or control of pain from uterine contractions will improve Outcome: Completed/Met   Problem: Education: Goal: Knowledge of General Education information will improve Description: Including pain rating scale, medication(s)/side effects and non-pharmacologic comfort measures Outcome: Completed/Met   Problem: Health Behavior/Discharge Planning: Goal: Ability to manage health-related needs will improve Outcome: Completed/Met   Problem: Clinical Measurements: Goal: Ability to maintain clinical measurements within normal limits will improve Outcome: Completed/Met Goal: Will remain free from infection Outcome: Completed/Met Goal: Diagnostic test results will improve Outcome: Completed/Met Goal: Respiratory complications will improve Outcome: Completed/Met Goal: Cardiovascular complication will be avoided Outcome: Completed/Met   Problem: Activity: Goal: Risk for activity intolerance will decrease Outcome:  Completed/Met   Problem: Nutrition: Goal: Adequate nutrition will be maintained Outcome: Completed/Met   Problem: Coping: Goal: Level of anxiety will decrease Outcome: Completed/Met   Problem: Elimination: Goal: Will not experience complications related to bowel motility Outcome: Completed/Met Goal: Will not experience complications related to urinary retention Outcome: Completed/Met   Problem: Pain Managment: Goal: General experience of comfort will improve and/or be controlled Outcome: Completed/Met   Problem: Safety: Goal: Ability to remain free from injury will improve Outcome: Completed/Met   Problem: Skin Integrity: Goal: Risk for impaired skin integrity will decrease Outcome: Completed/Met   Problem: Education: Goal: Ability to describe self-care measures that may prevent or decrease complications (Diabetes Survival Skills Education) will improve Outcome: Completed/Met Goal: Individualized Educational Video(s) Outcome: Completed/Met   Problem: Coping: Goal: Ability to adjust to condition or change in health will improve Outcome: Completed/Met   Problem: Fluid Volume: Goal: Ability to maintain a balanced intake and output will improve Outcome: Completed/Met   Problem: Health Behavior/Discharge Planning: Goal: Ability to identify and utilize available resources and services will improve Outcome: Completed/Met Goal: Ability to manage health-related needs will improve Outcome: Completed/Met   Problem: Metabolic: Goal: Ability to maintain appropriate glucose levels will improve Outcome: Completed/Met   Problem: Nutritional: Goal: Maintenance of adequate nutrition will improve Outcome: Completed/Met Goal: Progress toward achieving an optimal weight will improve Outcome: Completed/Met   Problem: Skin Integrity: Goal: Risk for impaired skin integrity will decrease Outcome: Completed/Met   Problem: Tissue Perfusion: Goal: Adequacy of tissue perfusion will  improve Outcome: Completed/Met

## 2023-03-15 NOTE — Lactation Note (Signed)
 This note was copied from a baby's chart. Lactation Consultation Note  Patient Name: Terri Ramirez [id number] Date: 03/15/2023 Age:27 hours Reason for consult: Initial assessment;1st time breastfeeding;Mother's request;Primapara;Early term 37-38.6wks  P1, 39 wks, @ 7 hrs of life. LC arrival- mom using DEBP- discussed use- strength settings/expectations. Praised mom great way to prime breast to start feeding. LC services and milk storage shared. Demonstrated cross cradle positioning on left breast, football on right breast. Demonstrated starting with hand expression, using breast compression through feed to keep infant working @ breast.  Demonstrated steps of latching and mom takes over. Audible swallows heard, feeding well with LC exit. Encouraged mom to call anytime assist desired.  Maternal Data Has patient been taught Hand Expression?: Yes Does the patient have breastfeeding experience prior to this delivery?: No  Feeding Mother's Current Feeding Choice: Breast Milk  LATCH Score Latch: Grasps breast easily, tongue down, lips flanged, rhythmical sucking.  Audible Swallowing: Spontaneous and intermittent  Type of Nipple: Everted at rest and after stimulation  Comfort (Breast/Nipple): Soft / non-tender  Hold (Positioning): Assistance needed to correctly position infant at breast and maintain latch.  LATCH Score: 9   Lactation Tools Discussed/Used    Interventions Interventions: Breast feeding basics reviewed;Assisted with latch;Hand express;Breast compression;Adjust position;Position options;Expressed milk;Coconut oil;DEBP;Education;LC Services brochure;CDC milk storage guidelines  Discharge Pump: Hands Free;Personal  Consult Status Consult Status: Follow-up Date: 03/16/23 Follow-up type: In-patient    [facility] 03/15/2023, 10:09 AM

## 2023-03-16 LAB — GLUCOSE, CAPILLARY
Glucose-Capillary: 126 mg/dL — ABNORMAL HIGH (ref 70–99)
Glucose-Capillary: 103 mg/dL — ABNORMAL HIGH (ref 70–99)

## 2023-03-16 MED ORDER — SUCROSE 24% NICU/PEDS ORAL SOLUTION: 0.5000 mL | OROMUCOSAL | Status: DC | PRN

## 2023-03-16 MED ORDER — GELATIN ABSORBABLE 12-7 MM EX MISC: 1.0000 | Freq: Once | CUTANEOUS | Status: DC | PRN

## 2023-03-16 MED ORDER — EPINEPHRINE TOPICAL FOR CIRCUMCISION 0.1 MG/ML: 1.0000 [drp] | TOPICAL | Status: DC | PRN

## 2023-03-16 MED ORDER — LIDOCAINE 1% INJECTION FOR CIRCUMCISION: 0.8000 mL | INJECTION | Freq: Once | INTRAVENOUS | Status: DC

## 2023-03-16 NOTE — Progress Notes (Addendum)
 Pt. Received ibuprofen at 2052 and next dose was given at 2346.

## 2023-03-16 NOTE — Progress Notes (Signed)
 Subjective: Postpartum Day # 1 : S/P NSVD due to pt was admitted on 2/28 for IOL for GHTN, previously declined recommendation for IOL at 37 weeks, pt PCR was 0.21 on 2/25, not retaken on admission, nop cmp done, pt been asymptomatic, bp 121/61, asymptomatic, pt also has GDMA2 was on metformin during pregnancy none PP, PP was 126, and fasting was 103 post 6 hours eating, will f/u PP 6 weeks, , GAD no med as and mood stable, pt progressed with foley, cytotec, pitocin, AREOM over intact perineum, ebl was , with hgb drop of 14-12.5, had baby female and desires in pt circ today. Patient up ad lib, denies syncope or dizziness. Reports consuming regular diet without issues and denies N/V. Patient reports 0 bowel movement + passing flatus.  Denies issues with urination and reports bleeding is "lighter."  Patient is breastfeeding and reports going well.  Desires undecided for postpartum contraception.  Pain is being appropriately managed with use of po meds. Pt endorse having back pressure , ordered kpad for comfort.   No laceration Feeding:  Breast Contraceptive plan:  undecided BB: Circ in pt   Objective: Vital signs in last 24 hours: Patient Vitals for the past 24 hrs:  BP Temp Temp src Pulse Resp SpO2  03/16/23 0626 121/61 97.8 F (36.6 C) Oral (!) 105 18 100 %  03/15/23 2029 121/73 97.7 F (36.5 C) Oral 100 18 100 %  03/15/23 1542 122/77 97.8 F (36.6 C) Oral 88 18 100 %     Physical Exam:  General: alert, cooperative, and appears stated age Mood/Affect: happy Lungs: clear to auscultation, no wheezes, rales or rhonchi, symmetric air entry.  Heart: normal rate, regular rhythm, normal S1, S2, no murmurs, rubs, clicks or gallops. Breast: breasts appear normal, no suspicious masses, no skin or nipple changes or axillary nodes. Abdomen:  + bowel sounds, soft, non-tender GU: perineum intact, healing well. No signs of external hematomas.  Uterine Fundus: firm Lochia: appropriate Skin:  Warm, Dry. DVT Evaluation: No evidence of DVT seen on physical exam. Negative Homan's sign. No cords or calf tenderness. No significant calf/ankle edema.     Latest Ref Rng & Units 03/15/2023    5:30 AM 03/13/2023   10:44 PM 03/11/2023    8:27 PM  CBC  WBC 4.0 - 10.5 K/uL 16.1  9.4  8.8   Hemoglobin 12.0 - 15.0 g/dL 16.1  09.6  04.5   Hematocrit 36.0 - 46.0 % 34.8  39.9  38.6   Platelets 150 - 400 K/uL 121  134  131     Results for orders placed or performed during the hospital encounter of 03/13/23 (from the past 24 hours)  Glucose, capillary     Status: Abnormal   Collection Time: 03/16/23  3:00 AM  Result Value Ref Range   Glucose-Capillary 126 (H) 70 - 99 mg/dL  Glucose, capillary     Status: Abnormal   Collection Time: 03/16/23  9:00 AM  Result Value Ref Range   Glucose-Capillary 103 (H) 70 - 99 mg/dL     CBG (last 3)  Recent Labs    03/15/23 0031 03/16/23 0300 03/16/23 0900  GLUCAP 101* 126* 103*     I/O last 3 completed shifts: In: -  Out: 250 [Blood:250]   Assessment Postpartum Day # 1 : S/P NSVD due to pt was admitted on 2/28 for IOL for GHTN, previously declined recommendation for IOL at 37 weeks, pt PCR was 0.21 on 2/25, not retaken  on admission, nop cmp done, pt been asymptomatic, bp 121/61, asymptomatic, pt also has GDMA2 was on metformin during pregnancy none PP, PP was 126, and fasting was 103 post 6 hours eating, will f/u PP 6 weeks, , GAD no med as and mood stable, pt progressed with foley, cytotec, pitocin, AREOM over intact perineum, ebl was , with hgb drop of 14-12.5, had baby female and desires in pt circ today. Pt stable. -1 involution. Breastfeeding. Hemodynamically stable.   Plan: Continue other mgmt as ordered GHTN: Monitor BP.  Back Pain: continue motrin and tylenol added kpad.  VTE prophylactics: Early ambulated as tolerates.  Pain control: Motrin/Tylenol PRN Education given regarding options for contraception, including barrier methods,  injectable contraception, IUD placement, oral contraceptives.  Plan for discharge tomorrow, Breastfeeding, Lactation consult, and Circumcision prior to discharge  Dr. Normand Sloop to be updated on patient status  [facility], FNP-C, PMHNP-BC  [id number]  [place], [place] [address]  Cell: [phone]  Office Phone: [phone] Fax: [phone] 03/16/2023  11:00 AM

## 2023-03-16 NOTE — Progress Notes (Addendum)
 CSW was consulted due to history of anxiety, depression, PTSD, and suicidal ideation during pregnancy. CSW attempted to meet with MOB to complete clinical assessment. When CSW entered room, MOB was observed asleep. CSW to return at a later time.   Signed,  Norberto Sorenson, MSW, [place], Bridget Hartshorn 03/16/2023 5:33 PM

## 2023-03-16 NOTE — Lactation Note (Signed)
 This note was copied from a baby's chart. Lactation Consultation Note  Patient Name: Terri Ramirez [id number] Date: 03/16/2023 Age:27 hours Reason for consult: Follow-up assessment  P1, 38 wks, @ 40 hrs of life. Infant on breast with LC arrival- football position on left breast. Per mom some nipple soreness- remains pretty constant after feeds. Relieved temporarily by coconut oil, but returns once oil wears away. Demonstrated hand expression on right breast, easily expressed big drops of colostrum- mom massages EBM into nipple for nipple health. Hydrogel pads discussed/ applied to help mom with healing/ nipple pain relief. Discussed pumping on low strength setting or hand expressing onto spoon and feeding baby off spoon if nipples need a break during cluster feeding tonight. Praised mom - couplet doing fabulous together.  Maternal Data Has patient been taught Hand Expression?: Yes Does the patient have breastfeeding experience prior to this delivery?: No  Feeding Mother's Current Feeding Choice: Breast Milk  LATCH Score Latch: Grasps breast easily, tongue down, lips flanged, rhythmical sucking.  Audible Swallowing: Spontaneous and intermittent  Type of Nipple: Everted at rest and after stimulation  Comfort (Breast/Nipple): Soft / non-tender  Hold (Positioning): Assistance needed to correctly position infant at breast and maintain latch.  LATCH Score: 9   Lactation Tools Discussed/Used    Interventions Interventions: Breast feeding basics reviewed;Hand express;Breast compression;Expressed milk;Coconut oil;Comfort gels;Education  Discharge Pump: Hands Free;Personal  Consult Status Consult Status: Follow-up Date: 03/17/23 Follow-up type: In-patient    [facility] 03/16/2023, 6:09 PM

## 2023-03-17 LAB — GLUCOSE, CAPILLARY
Glucose-Capillary: 105 mg/dL — ABNORMAL HIGH (ref 70–99)
Glucose-Capillary: 57 mg/dL — ABNORMAL LOW (ref 70–99)

## 2023-03-17 MED ORDER — SENNOSIDES-DOCUSATE SODIUM 8.6-50 MG PO TABS: 2.0000 | ORAL_TABLET | Freq: Every day | ORAL | 1 refills | Status: DC | PRN

## 2023-03-17 MED ORDER — IBUPROFEN 600 MG PO TABS: 600.0000 mg | ORAL_TABLET | Freq: Four times a day (QID) | ORAL | 1 refills | Status: DC | PRN

## 2023-03-17 MED ORDER — ACETAMINOPHEN 325 MG PO TABS: 650.0000 mg | ORAL_TABLET | Freq: Four times a day (QID) | ORAL | 1 refills | Status: DC | PRN

## 2023-03-17 NOTE — Progress Notes (Signed)
 Hypoglycemic Event  CBG: 57  Treatment: 8 oz juice/soda  Symptoms: Shaky, Hungry, and dizzy  Follow-up CBG: Time:0120 CBG Result:105  Possible Reasons for Event: Inadequate meal intake  Comments/MD notified:Educated pt on importance of adequate nutrition and hydration while breastfeeding. (Pt had not eaten dinner try from 2000 until after blood sugar at 0107.) Pt knows to call if she feels like her blood sugar is low. (Pt had initially c/o feeling dizzy, BP 130/85, then stated she felt like she would if her blood sugar was low when she was pregnant.)    Neale Burly RN

## 2023-03-17 NOTE — Social Work (Signed)
 CSW received consult for hx of Anxiety and Depression.  CSW met with MOB to offer support and complete assessment.  CSW entered the room and observed MOB resting in bed, FOB at bedside and the infant in the bassinet. CSW introduced self, And requested to speak with MOB in private, MOB was agreeable and FOB willingly stepped ut of there room. CSW informed MOB the reason for the consult. CSW inquired about how MOB was feeling, MOB reported good. CSW inquired about MOB noted MH hx, MOB reported she was diagnosed about a year ago. CSW inquired about current treatment, MOB reported she was taking Lexapro prior to pregnancy but plans to go back on Lexapro as long as its safe. MOB also reported she sees a therapist weekly. CSW inquired about how MOB was feeling with the arrival of the infant after some uncertainty about the pregnancy at 24 weeks, MOB reported feeling guilty for having doubts about keeping the baby, MOB reported she is bonded with the infant and can't imagine not having him. MOB reported an improved mood since then. CSW assessed for safety, MOB denied any SI, HI or DV. CSW provided education regarding the baby blues period vs. perinatal mood disorders, discussed treatment and gave resources for mental health follow up if concerns arise.  CSW recommends self-evaluation during the postpartum time period using the New Mom Checklist from Postpartum Progress and encouraged MOB to contact a medical professional if symptoms are noted at any time.  MOB identified her best friend as her supports.   CSW provided review of Sudden Infant Death Syndrome (SIDS) precautions.  MOB reported she has all necessary items for the infant including a bassinet, crib and car seat.  CSW identifies no further need for intervention and no barriers to discharge at this time.  Wende Neighbors, LCSWA Clinical Social Worker [phone]

## 2023-03-17 NOTE — Progress Notes (Signed)
 Pt called out requesting scheduled tylenol. C/o perineum pain after being up to bathroom and passing a large clot in the toilet that pt describes as the size of 2 tennis balls. Pt states she flushed the toilet before returning to the bed. Fundus assessed and 2 below umbilicus with scant lochia. Medicated with scheduled tylenol. Instructed pt to call if concerns with bleeding or clots. Pt denies dizziness.

## 2023-03-17 NOTE — Lactation Note (Signed)
 This note was copied from a baby's chart. Lactation Consultation Note  Patient Name: Terri Ramirez [id number] Date: 03/17/2023 Age:27 hours  Reason for consult: Follow-up assessment;Nipple pain/trauma;Primapara;1st time breastfeeding;Infant weight loss;Mother's request  Returned to room. Mother pumped and expressed 4 ml of colostrum and fed to baby by bottle. She has baby latched now to the right breast and plans to pump and feed her expressed milk.  Mother also reports that she has a lactation appointment set up at her pediatrician's office tomorrow with Rona Ravens, IBCLC.Antony Madura, Meriam Sprague M 03/17/2023, 11:00 AM

## 2023-03-17 NOTE — Lactation Note (Signed)
 This note was copied from a baby's chart. Lactation Consultation Note  Patient Name: Terri Ramirez [id number] Date: 03/17/2023 Age:27 hours  Reason for consult: Follow-up assessment;Nipple pain/trauma;Primapara;1st time breastfeeding;Infant weight loss;Mother's request  P1, [redacted]w[redacted]d, 10% weight loss  Mother called for [facility] assistance with breastfeeding and concerns regarding left nipple pain. Mother's breast are filling, colostrum is easily expressed and the outer ridge of both breast are firm. Mother reports baby "Terri Ramirez" cluster fed last night.   Baby crying after nurse check and eagerly latched to the breast. Mother latched him independently in an upright football hold. Mother shows facial grimace with the initial latch but reports it subsides after baby gets settled and feeding. Infant removed and mother re-latched, having the same experience with discomfort. Baby latched with  a wide gape, rhythmic suck and audible swallows are heard. Infant's cheeks dimple, chomping motion with lower jaw observed and mother reports baby's tongue sometimes pinches the tip of her nipple with feeding.  When baby was removed from the breast, mother's nipple was rounded and colostrum was easily expressed.  Breastfeeding positions: football, cross cradle and upright football, were used to see what was more comfortable for mother and baby. Mother is most comfortable when she latches her baby, holding him in an upright position with baby facing the breast.   Advised mother to pump both breast to provide supplement due to weight loss and to allow the left nipple to rest and heal. Breast shells were given with instructions to reduce friction and promote healing. New hydrogel pads given which mother states have been soothing. She continues to apply coconut oil and her expressed milk to nipples, as well.    Instructed mother to call for help as needed. Update to Barnetta Chapel, NP and nurse.      Feeding Mother's  Current Feeding Choice: Breast Milk  LATCH Score Latch: Grasps breast easily, tongue down, lips flanged, rhythmical sucking.  Audible Swallowing: Spontaneous and intermittent  Type of Nipple: Everted at rest and after stimulation  Comfort (Breast/Nipple): Filling, red/small blisters or bruises, mild/mod discomfort  Hold (Positioning): No assistance needed to correctly position infant at breast.  LATCH Score: 9   Lactation Tools Discussed/Used Tools: Shells;Pump;Flanges;Coconut oil;Comfort gels Flange Size: 21 Breast pump type: Double-Electric Breast Pump Pump Education: Setup, frequency, and cleaning;Milk Storage (pump was already set up at bedside) Reason for Pumping: sore left nipple, weight loss, supplement Pumping frequency: every 3 hours for additioanl supplement  Interventions Interventions: Breast feeding basics reviewed;Assisted with latch;Skin to skin;Hand express;Breast compression;Adjust position;Support pillows;Position options;Coconut oil;Shells;Comfort gels;Education  Discharge Discharge Education: Warning signs for feeding baby;Outpatient recommendation;Engorgement and breast care;Outpatient Epic message sent Pump: Hands Free  Consult Status Consult Status: PRN (pending discharge) Date: 03/17/23    Omar Person 03/17/2023, 9:28 AM

## 2023-03-18 DIAGNOSIS — O165 Unspecified maternal hypertension, complicating the puerperium: Secondary | ICD-10-CM | POA: Diagnosis not present

## 2023-03-18 DIAGNOSIS — Z00110 Health examination for newborn under 8 days old: Secondary | ICD-10-CM | POA: Diagnosis not present

## 2023-03-18 DIAGNOSIS — Z2911 Encounter for prophylactic immunotherapy for respiratory syncytial virus (RSV): Secondary | ICD-10-CM | POA: Diagnosis not present

## 2023-03-21 DIAGNOSIS — O165 Unspecified maternal hypertension, complicating the puerperium: Secondary | ICD-10-CM | POA: Diagnosis not present

## 2023-03-24 ENCOUNTER — Inpatient Hospital Stay (HOSPITAL_COMMUNITY)
Admission: AD | Admit: 2023-03-24 | Discharge: 2023-03-24 | Disposition: A | Discharge status: Home / Self Care | Attending: Obstetrics and Gynecology | Admitting: Obstetrics and Gynecology

## 2023-03-24 ENCOUNTER — Encounter (HOSPITAL_COMMUNITY): Payer: Self-pay | Admitting: Obstetrics and Gynecology

## 2023-03-24 ENCOUNTER — Telehealth (HOSPITAL_COMMUNITY): Payer: Self-pay | Admitting: *Deleted

## 2023-03-24 DIAGNOSIS — O9089 Other complications of the puerperium, not elsewhere classified: Secondary | ICD-10-CM | POA: Insufficient documentation

## 2023-03-24 DIAGNOSIS — R109 Unspecified abdominal pain: Secondary | ICD-10-CM | POA: Insufficient documentation

## 2023-03-24 DIAGNOSIS — Z79899 Other long term (current) drug therapy: Secondary | ICD-10-CM | POA: Diagnosis not present

## 2023-03-24 DIAGNOSIS — O165 Unspecified maternal hypertension, complicating the puerperium: Secondary | ICD-10-CM | POA: Diagnosis not present

## 2023-03-24 DIAGNOSIS — R112 Nausea with vomiting, unspecified: Secondary | ICD-10-CM | POA: Diagnosis not present

## 2023-03-24 DIAGNOSIS — R1011 Right upper quadrant pain: Secondary | ICD-10-CM | POA: Diagnosis not present

## 2023-03-24 DIAGNOSIS — R309 Painful micturition, unspecified: Secondary | ICD-10-CM | POA: Insufficient documentation

## 2023-03-24 DIAGNOSIS — O135 Gestational [pregnancy-induced] hypertension without significant proteinuria, complicating the puerperium: Secondary | ICD-10-CM | POA: Diagnosis not present

## 2023-03-24 DIAGNOSIS — R7401 Elevation of levels of liver transaminase levels: Secondary | ICD-10-CM | POA: Diagnosis not present

## 2023-03-24 DIAGNOSIS — R0602 Shortness of breath: Secondary | ICD-10-CM | POA: Insufficient documentation

## 2023-03-24 DIAGNOSIS — F4312 Post-traumatic stress disorder, chronic: Secondary | ICD-10-CM | POA: Diagnosis not present

## 2023-03-24 LAB — URINALYSIS, ROUTINE W REFLEX MICROSCOPIC
Bacteria, UA: NONE SEEN
Bilirubin Urine: NEGATIVE
Glucose, UA: NEGATIVE mg/dL
Ketones, ur: NEGATIVE mg/dL
Nitrite: NEGATIVE
Protein, ur: NEGATIVE mg/dL
Specific Gravity, Urine: 1.021 (ref 1.005–1.030)
WBC, UA: >50 WBC/hpf (ref 0–5)
pH: 6 (ref 5.0–8.0)

## 2023-03-24 LAB — COMPREHENSIVE METABOLIC PANEL
ALT: 45 U/L — ABNORMAL HIGH (ref 0–44)
AST: 29 U/L (ref 15–41)
Albumin: 3.1 g/dL — ABNORMAL LOW (ref 3.5–5.0)
Alkaline Phosphatase: 114 U/L (ref 38–126)
Anion gap: 10 (ref 5–15)
BUN: 17 mg/dL (ref 6–20)
CO2: 24 mmol/L (ref 22–32)
Calcium: 8.3 mg/dL — ABNORMAL LOW (ref 8.9–10.3)
Chloride: 103 mmol/L (ref 98–111)
Creatinine, Ser: 0.61 mg/dL (ref 0.44–1.00)
GFR, Estimated: >60 mL/min (ref >60)
Glucose, Bld: 96 mg/dL (ref 70–99)
Potassium: 4.2 mmol/L (ref 3.5–5.1)
Sodium: 137 mmol/L (ref 135–145)
Total Bilirubin: 1.1 mg/dL (ref 0.0–1.2)
Total Protein: 6.8 g/dL (ref 6.5–8.1)

## 2023-03-24 LAB — CBC
HCT: 34.3 % — ABNORMAL LOW (ref 36.0–46.0)
Hemoglobin: 11.3 g/dL — ABNORMAL LOW (ref 12.0–15.0)
MCH: 31.8 pg (ref 26.0–34.0)
MCHC: 32.9 g/dL (ref 30.0–36.0)
MCV: 96.6 fL (ref 80.0–100.0)
Platelets: 349 10*3/uL (ref 150–400)
RBC: 3.55 MIL/uL — ABNORMAL LOW (ref 3.87–5.11)
RDW: 13.2 % (ref 11.5–15.5)
WBC: 11.7 10*3/uL — ABNORMAL HIGH (ref 4.0–10.5)
nRBC: 0 % (ref 0.0–0.2)

## 2023-03-24 MED ORDER — NIFEDIPINE ER OSMOTIC RELEASE 30 MG PO TB24
30.0000 mg | ORAL_TABLET | Freq: Once | ORAL | Status: AC
Start: 2023-03-24 — End: 2023-03-24
  Administered 2023-03-24: 30 mg via ORAL
  Filled 2023-03-24: qty 1

## 2023-03-24 MED ORDER — LACTATED RINGERS IV BOLUS
1000.0000 mL | Freq: Once | INTRAVENOUS | Status: AC
  Administered 2023-03-24: 1000 mL via INTRAVENOUS

## 2023-03-24 MED ORDER — NIFEDIPINE ER OSMOTIC RELEASE 30 MG PO TB24: 30.0000 mg | ORAL_TABLET | Freq: Every day | ORAL | 2 refills | Status: DC

## 2023-03-24 MED ORDER — PROCHLORPERAZINE EDISYLATE 10 MG/2ML IJ SOLN
10.0000 mg | Freq: Once | INTRAMUSCULAR | Status: AC
  Administered 2023-03-24: 10 mg via INTRAVENOUS
  Filled 2023-03-24: qty 2

## 2023-03-24 MED ORDER — ACETAMINOPHEN-CAFFEINE 500-65 MG PO TABS
2.0000 | ORAL_TABLET | Freq: Once | ORAL | Status: AC
  Administered 2023-03-24: 2 via ORAL
  Filled 2023-03-24: qty 2

## 2023-03-24 MED ORDER — CYCLOBENZAPRINE HCL 5 MG PO TABS
10.0000 mg | ORAL_TABLET | Freq: Once | ORAL | Status: AC
Start: 2023-03-24 — End: 2023-03-24
  Administered 2023-03-24: 10 mg via ORAL
  Filled 2023-03-24: qty 2

## 2023-03-24 NOTE — MAU Provider Note (Signed)
 History     [id number]  Arrival date and time: 03/24/23 1411    Chief Complaint  Patient presents with   Hypertension   Headache   Nausea   Emesis     HPI Terri Ramirez is a 28 y.o. s/p NSVD on 03/15/2023 with PMHx notable for gestational HTN and GDM in pregnancy, who presents for elevated blood pressure and headache.    Review of discharge summary from last admission on 03/17/2023: had uncomplicated delivery. Normotensive and discharged without meds on PPD #2  Patient reports getting elevated BP's since going home, mostly 140's/90-100's Has not been started on any BP medicine to date Also has an intense headache for the past two days on and off but today has been present since she woke up Located in forehead and behind her eyes bilaterally Also feeling some nausea Reports no hx of migraines No chest pain but does endorse some RUQ pain Feels a little SOB at rest as well Reports bleeding still like a light period Also having burning and pain with urination along with uterine cramps while peeing  --/--/O POS (02/27 2244)  OB History     Gravida  1   Para  1   Term  1   Preterm      AB      Living  1      SAB      IAB      Ectopic      Multiple      Live Births  1           Past Medical History:  Diagnosis Date   Anxiety    Gestational diabetes    Low blood pressure    Low sodium levels     History reviewed. No pertinent surgical history.  Family History  Adopted: Yes  Family history unknown: Yes    Social History   Socioeconomic History   Marital status: Single    Spouse name: Not on file   Number of children: Not on file   Years of education: Not on file   Highest education level: Not on file  Occupational History   Not on file  Tobacco Use   Smoking status: Never   Smokeless tobacco: Never  Vaping Use   Vaping status: Never Used  Substance and Sexual Activity   Alcohol use: Not Currently   Drug use: Not Currently     Types: Marijuana    Comment: none during preg   Sexual activity: Not Currently  Other Topics Concern   Not on file  Social History Narrative   Not on file   Social Drivers of Health   Financial Resource Strain: Not on file  Food Insecurity: No Food Insecurity (03/14/2023)   Hunger Vital Sign    Worried About Running Out of Food in the Last Year: Never true    Ran Out of Food in the Last Year: Never true  Transportation Needs: No Transportation Needs (03/14/2023)   PRAPARE - Administrator, Civil Service (Medical): No    Lack of Transportation (Non-Medical): No  Physical Activity: Not on file  Stress: Not on file  Social Connections: Unknown (05/28/2021)   Received [facility]   Social Network    Social Network: Not on file  Intimate Partner Violence: Not At Risk (03/14/2023)   Humiliation, Afraid, Rape, and Kick questionnaire    Fear of Current or Ex-Partner: No    Emotionally Abused: No  Physically Abused: No    Sexually Abused: No    No Known Allergies  No current facility-administered medications on file prior to encounter.   Current Outpatient Medications on File Prior to Encounter  Medication Sig Dispense Refill   acetaminophen (TYLENOL) 500 MG tablet Take 500 mg by mouth every 6 (six) hours as needed.     ibuprofen (ADVIL) 600 MG tablet Take 1 tablet (600 mg total) by mouth every 6 (six) hours as needed. 40 tablet 1   Prenatal Vit-Fe Fumarate-FA (PRENATAL MULTIVITAMIN) TABS tablet Take 1 tablet by mouth daily at 12 noon.     acetaminophen (TYLENOL) 325 MG tablet Take 2 tablets (650 mg total) by mouth every 6 (six) hours as needed. 40 tablet 1   cholecalciferol (VITAMIN D3) 25 MCG (1000 UNIT) tablet Take 1,000 Units by mouth daily.     escitalopram (LEXAPRO) 10 MG tablet TAKE 1 TABLET BY MOUTH EVERY DAY 90 tablet 1   ondansetron (ZOFRAN-ODT) 4 MG disintegrating tablet Take 1 tablet (4 mg total) by mouth every 6 (six) hours as needed for  nausea. 20 tablet 0   oxycodone (OXY-IR) 5 MG capsule Take 1 capsule (5 mg total) by mouth every 4 (four) hours as needed for pain (headache). 2 capsule 0   promethazine (PHENERGAN) 25 MG tablet Take 1 tablet (25 mg total) by mouth every 6 (six) hours as needed for nausea or vomiting. 30 tablet 2   senna-docusate (SENOKOT-S) 8.6-50 MG tablet Take 2 tablets by mouth daily as needed for mild constipation. 60 tablet 1     ROS Pertinent positives and negative per HPI, all others reviewed and negative  Physical Exam   BP 113/60   Pulse 87   Temp 98.2 F (36.8 C) (Oral)   Resp 18   Ht 5\' 3"  (1.6 m)   Wt 67.5 kg   SpO2 100%   Breastfeeding Yes   BMI 26.36 kg/m   Patient Vitals for the past 24 hrs:  BP Temp Temp src Pulse Resp SpO2 Height Weight  03/24/23 1645 113/60 -- -- 87 -- -- -- --  03/24/23 1630 127/71 -- -- 97 -- -- -- --  03/24/23 1615 130/79 -- -- 93 -- -- -- --  03/24/23 1600 130/77 -- -- 95 -- -- -- --  03/24/23 1531 135/83 -- -- (!) 103 -- -- -- --  03/24/23 1516 (!) 151/98 -- -- (!) 113 -- -- -- --  03/24/23 1503 139/82 -- -- (!) 114 -- -- -- --  03/24/23 1445 136/78 -- -- (!) 113 -- 100 % -- --  03/24/23 1436 (!) 146/81 -- -- (!) 108 -- 99 % -- --  03/24/23 1420 125/82 98.2 F (36.8 C) Oral (!) 115 18 100 % 5\' 3"  (1.6 m) 67.5 kg    Physical Exam Vitals reviewed.  Constitutional:      General: She is not in acute distress.    Appearance: She is well-developed. She is not diaphoretic.  Eyes:     General: No scleral icterus. Pulmonary:     Effort: Pulmonary effort is normal. No respiratory distress.  Abdominal:     General: There is no distension.     Palpations: Abdomen is soft.     Tenderness: There is no abdominal tenderness. There is no guarding or rebound.  Skin:    General: Skin is warm and dry.  Neurological:     Mental Status: She is alert.     Coordination: Coordination normal.  Cervical Exam    Bedside Ultrasound Not performed.  My  interpretation: n/a   Labs Results for orders placed or performed during the hospital encounter of 03/24/23 (from the past 24 hours)  Urinalysis, Routine w reflex microscopic -Urine, Clean Catch     Status: Abnormal   Collection Time: 03/24/23  3:45 PM  Result Value Ref Range   Color, Urine YELLOW YELLOW   APPearance HAZY (A) CLEAR   Specific Gravity, Urine 1.021 1.005 - 1.030   pH 6.0 5.0 - 8.0   Glucose, UA NEGATIVE NEGATIVE mg/dL   Hgb urine dipstick LARGE (A) NEGATIVE   Bilirubin Urine NEGATIVE NEGATIVE   Ketones, ur NEGATIVE NEGATIVE mg/dL   Protein, ur NEGATIVE NEGATIVE mg/dL   Nitrite NEGATIVE NEGATIVE   Leukocytes,Ua LARGE (A) NEGATIVE   RBC / HPF 6-10 0 - 5 RBC/hpf   WBC, UA >50 0 - 5 WBC/hpf   Bacteria, UA NONE SEEN NONE SEEN   Squamous Epithelial / HPF 0-5 0 - 5 /HPF   Mucus PRESENT   CBC     Status: Abnormal   Collection Time: 03/24/23  3:45 PM  Result Value Ref Range   WBC 11.7 (H) 4.0 - 10.5 K/uL   RBC 3.55 (L) 3.87 - 5.11 MIL/uL   Hemoglobin 11.3 (L) 12.0 - 15.0 g/dL   HCT 16.1 (L) 09.6 - 04.5 %   MCV 96.6 80.0 - 100.0 fL   MCH 31.8 26.0 - 34.0 pg   MCHC 32.9 30.0 - 36.0 g/dL   RDW 40.9 81.1 - 91.4 %   Platelets 349 150 - 400 K/uL   nRBC 0.0 0.0 - 0.2 %  Comprehensive metabolic panel     Status: Abnormal   Collection Time: 03/24/23  3:45 PM  Result Value Ref Range   Sodium 137 135 - 145 mmol/L   Potassium 4.2 3.5 - 5.1 mmol/L   Chloride 103 98 - 111 mmol/L   CO2 24 22 - 32 mmol/L   Glucose, Bld 96 70 - 99 mg/dL   BUN 17 6 - 20 mg/dL   Creatinine, Ser 7.82 0.44 - 1.00 mg/dL   Calcium 8.3 (L) 8.9 - 10.3 mg/dL   Total Protein 6.8 6.5 - 8.1 g/dL   Albumin 3.1 (L) 3.5 - 5.0 g/dL   AST 29 15 - 41 U/L   ALT 45 (H) 0 - 44 U/L   Alkaline Phosphatase 114 38 - 126 U/L   Total Bilirubin 1.1 0.0 - 1.2 mg/dL   GFR, Estimated >95 >62 mL/min   Anion gap 10 5 - 15    Imaging No results found.  MAU Course  Procedures Lab Orders         Urinalysis, Routine  w reflex microscopic -Urine, Clean Catch         CBC         Comprehensive metabolic panel    Meds ordered this encounter  Medications   lactated ringers bolus 1,000 mL   acetaminophen-caffeine (EXCEDRIN TENSION HEADACHE) 500-65 MG per tablet 2 tablet   cyclobenzaprine (FLEXERIL) tablet 10 mg   prochlorperazine (COMPAZINE) injection 10 mg   NIFEdipine (PROCARDIA-XL/NIFEDICAL-XL) 24 hr tablet 30 mg   NIFEdipine (PROCARDIA-XL/NIFEDICAL-XL) 30 MG 24 hr tablet    Sig: Take 1 tablet (30 mg total) by mouth daily.    Dispense:  30 tablet    Refill:  2   Imaging Orders  No imaging studies ordered today    MDM Moderate (Level 3-4)  Assessment and Plan  #  Gestational hypertension, postpartum Patient initially presenting with headache, resolved with above interventions. Mildly elevated ALT but otherwise no severe features. Started on Nifedipine 30 XL while in MAU with normalization of BP's. Discussed with Dr. Su Hilt, she will have patient f/u in clinic later this week.    Dispo: discharged to home in stable condition    Venora Maples, MD/MPH 03/24/23 5:05 PM  Allergies as of 03/24/2023   No Known Allergies      Medication List     TAKE these medications    acetaminophen 500 MG tablet Commonly known as: TYLENOL Take 500 mg by mouth every 6 (six) hours as needed.   acetaminophen 325 MG tablet Commonly known as: Tylenol Take 2 tablets (650 mg total) by mouth every 6 (six) hours as needed.   cholecalciferol 25 MCG (1000 UNIT) tablet Commonly known as: VITAMIN D3 Take 1,000 Units by mouth daily.   escitalopram 10 MG tablet Commonly known as: LEXAPRO TAKE 1 TABLET BY MOUTH EVERY DAY   ibuprofen 600 MG tablet Commonly known as: ADVIL Take 1 tablet (600 mg total) by mouth every 6 (six) hours as needed.   NIFEdipine 30 MG 24 hr tablet Commonly known as: PROCARDIA-XL/NIFEDICAL-XL Take 1 tablet (30 mg total) by mouth daily.   ondansetron 4 MG disintegrating  tablet Commonly known as: ZOFRAN-ODT Take 1 tablet (4 mg total) by mouth every 6 (six) hours as needed for nausea.   oxycodone 5 MG capsule Commonly known as: OXY-IR Take 1 capsule (5 mg total) by mouth every 4 (four) hours as needed for pain (headache).   prenatal multivitamin Tabs tablet Take 1 tablet by mouth daily at 12 noon.   promethazine 25 MG tablet Commonly known as: PHENERGAN Take 1 tablet (25 mg total) by mouth every 6 (six) hours as needed for nausea or vomiting.   senna-docusate 8.6-50 MG tablet Commonly known as: Senokot-S Take 2 tablets by mouth daily as needed for mild constipation.

## 2023-03-24 NOTE — Telephone Encounter (Signed)
 03/24/2023  Name: Kmya Placide MRN: [id number] DOB: [birth year]  Reason for Call:  Transition of Care Hospital Discharge Call  Contact Status: Patient Contact Status: Message  Patient answered, but said she was in MAU being evaluated for high blood pressures.  Told patient we would call back in a few days.  Language assistant needed:          Follow-Up Questions:    Inocente Salles Postnatal Depression Scale:  In the Past 7 Days:    PHQ2-9 Depression Scale:     Discharge Follow-up:    Post-discharge interventions: NA  Salena Saner, RN 03/24/2023 15:06

## 2023-03-24 NOTE — MAU Note (Signed)
.  Terri Ramirez is a 28 y.o. at Unknown here in MAU reporting: PP vaginal delivery on 3/1. Reports elevated BP of 171/109 at home last night. She reports on-going headache (for past three days), nausea, and vomiting. Has not eaten today due to the nausea.   PIH Assessment: Headache present: Yes ;Has not responded to treatment and ;Last took Tylenol at before 1100 this AM Visual disturbances: Blurred RUQ pain/Epigastric: Sharp and Intermittent Atypical edema: None Hx of HBP: GHTN; IOL for HBP BP Medications: None prescribed  Onset of complaint: Past 3 days Pain score: 9/10 Vitals:   03/24/23 1420  BP: 125/82  Pulse: (!) 115  Resp: 18  Temp: 98.2 F (36.8 C)  SpO2: 100%     Lab orders placed from triage: UA

## 2023-03-25 ENCOUNTER — Inpatient Hospital Stay (HOSPITAL_COMMUNITY): Payer: BC Managed Care – PPO

## 2023-03-25 ENCOUNTER — Inpatient Hospital Stay (HOSPITAL_COMMUNITY)
Admission: RE | Admit: 2023-03-25 | Payer: BC Managed Care – PPO | Source: Home / Self Care | Admitting: Obstetrics & Gynecology

## 2023-03-26 ENCOUNTER — Telehealth (HOSPITAL_COMMUNITY): Payer: Self-pay | Admitting: *Deleted

## 2023-03-26 NOTE — Telephone Encounter (Signed)
 03/26/2023  Name: Terri Ramirez MRN: [id number] DOB: 08/05/1995  Reason for Call:  Transition of Care Hospital Discharge Call  Contact Status: Patient Contact Status: Complete  Language assistant needed: Interpreter Mode: Interpreter Not Needed        Follow-Up Questions: Do You Have Any Concerns About Your Health As You Heal From Delivery?: Yes What Concerns Do You Have About Your Health?: Was seen in MAU 2 days ago and is feeling better.  She has not yet picked up prescription for nifedipine, but plans to do so today.  Encouraged her to begin the medication as prescribed. Patient unsure about when to follow-up with her OB for BP check.  Advised patient to call her OB provider and ask, likely provider will have her come in within 1-2 weeks. Do You Have Any Concerns About Your Infants Health?: No  Edinburgh Postnatal Depression Scale:  In the Past 7 Days: I have been able to laugh and see the funny side of things.: As much as I always could I have looked forward with enjoyment to things.: As much as I ever did I have blamed myself unnecessarily when things went wrong.: No, never I have been anxious or worried for no good reason.: Hardly ever I have felt scared or panicky for no good reason.: No, not much Things have been getting on top of me.: No, most of the time I have coped quite well I have been so unhappy that I have had difficulty sleeping.: Not at all I have felt sad or miserable.: Not very often I have been so unhappy that I have been crying.: Only occasionally The thought of harming myself has occurred to me.: Never Edinburgh Postnatal Depression Scale Total: 5  PHQ2-9 Depression Scale:     Discharge Follow-up: Edinburgh score requires follow up?: No Patient was advised of the following resources:: Breastfeeding Support Group, Support Group  Post-discharge interventions: Reviewed Newborn Safe Sleep Practices  Salena Saner, RN 03/26/2023 14:02

## 2023-03-28 NOTE — Telephone Encounter (Signed)
 Please see pt msg, front office scheduling baby on 3/18 @ 11am

## 2023-03-31 DIAGNOSIS — F4312 Post-traumatic stress disorder, chronic: Secondary | ICD-10-CM | POA: Diagnosis not present

## 2023-04-01 DIAGNOSIS — Z4801 Encounter for change or removal of surgical wound dressing: Secondary | ICD-10-CM | POA: Diagnosis not present

## 2023-04-07 DIAGNOSIS — F4312 Post-traumatic stress disorder, chronic: Secondary | ICD-10-CM | POA: Diagnosis not present

## 2023-04-07 DIAGNOSIS — R0981 Nasal congestion: Secondary | ICD-10-CM | POA: Diagnosis not present

## 2023-04-07 NOTE — Telephone Encounter (Signed)
 FYI pt scheduled to come in office this week for f/u since having baby

## 2023-04-10 ENCOUNTER — Ambulatory Visit (INDEPENDENT_AMBULATORY_CARE_PROVIDER_SITE_OTHER): Admitting: Physician Assistant

## 2023-04-10 ENCOUNTER — Encounter: Payer: Self-pay | Admitting: Physician Assistant

## 2023-04-10 VITALS — BP 112/76 | HR 91 | Temp 97.5°F | Ht 63.0 in | Wt 151.2 lb

## 2023-04-10 DIAGNOSIS — F53 Postpartum depression: Secondary | ICD-10-CM

## 2023-04-10 DIAGNOSIS — K602 Anal fissure, unspecified: Secondary | ICD-10-CM | POA: Diagnosis not present

## 2023-04-10 DIAGNOSIS — F419 Anxiety disorder, unspecified: Secondary | ICD-10-CM | POA: Diagnosis not present

## 2023-04-10 MED ORDER — SENNOSIDES-DOCUSATE SODIUM 8.6-50 MG PO TABS: 2.0000 | ORAL_TABLET | Freq: Every day | ORAL | 1 refills | Status: DC | PRN

## 2023-04-10 MED ORDER — HYDROCORTISONE ACETATE 25 MG RE SUPP: 25.0000 mg | Freq: Two times a day (BID) | RECTAL | 0 refills | Status: DC

## 2023-04-10 MED ORDER — ESCITALOPRAM OXALATE 10 MG PO TABS: 10.0000 mg | ORAL_TABLET | Freq: Every day | ORAL | 1 refills | Status: DC

## 2023-04-10 NOTE — Progress Notes (Signed)
 Patient ID: Terri Ramirez, female    DOB: 16-Jun-1995, 28 y.o.   MRN: [id number]   Assessment & Plan:  Postpartum depression  Anxiety -     Escitalopram Oxalate; Take 1 tablet (10 mg total) by mouth daily.  Dispense: 90 tablet; Refill: 1  Anal fissure  Other orders -     Hydrocortisone Acetate; Place 1 suppository (25 mg total) rectally 2 (two) times daily.  Dispense: 12 suppository; Refill: 0 -     Sennosides-Docusate Sodium; Take 2 tablets by mouth daily as needed for mild constipation.  Dispense: 60 tablet; Refill: 1      Assessment and Plan Assessment & Plan Postpartum Depression She exhibits symptoms of postpartum depression, including sadness, resentment, isolation, frequent crying, and feeling overwhelmed with newborn care. She is breastfeeding and considering restarting Lexapro, which is safe during breastfeeding and may alleviate emotional extremes and reduce sadness and anger. She is followed closely with OB/GYN. Denies any SI or HI.  - Prescribe Lexapro, starting with half a tablet for a week, then increasing to a full tablet if needed. - Follow up in 3-4 months to assess mental health status.  Anal Fissure She experiences severe pain and bleeding during bowel movements, consistent with an anal fissure. The pain, described as pins and needles, worsens with hard stools. Current stool softener use may be ineffective. Anusol suppositories can aid in healing. - Prescribe Anusol suppositories to aid in healing the anal fissure. - Increase stool softener to twice daily. - Encourage increased fluid intake and fiber supplementation to maintain soft stools.  Postpartum Health Maintenance As a new mother, she is managing her postpartum health and is concerned about mental and physical recovery. Guidance was provided on managing symptoms and maintaining health during this period. - Encourage continued breastfeeding for maternal and child benefits. - Advise on maintaining  hydration and a balanced diet to support breastfeeding and recovery.      Return in about 3 months (around 07/11/2023) for recheck/follow-up.    Subjective:    Chief Complaint  Patient presents with   Postpartum Care    Pt in office to see PCP after having newborn for follow up; pt is doing well, just naturally tired; pt is in love with being a Mommy and truly loves her son; states she may be a little obsessed. Pt has appt next week to check on healing and using gel but doesn't seem to be helping; also taking Sittz baths; bowel movements are horrible and causes a lot of pain and bleeding; pt staying hydrated with water and liquid IV; also breastfeeding is going well;     HPI Discussed the use of AI scribe software for clinical note transcription with the patient, who gave verbal consent to proceed.  History of Present Illness Terri Ramirez is a 28 year old female who presents with postpartum complications and anal fissure.  She is experiencing significant postpartum challenges, including feelings of isolation and sadness. Her partner was not supportive during the initial postpartum period, leading to feelings of resentment and loneliness. The first night home was particularly difficult, staying up until 4 AM crying, which she identifies as the beginning of her postpartum depression. She is breastfeeding her newborn, Terri Ramirez, who is almost four weeks old. She reports difficulty managing her responsibilities alone, including caring for her dog, Pluto, and maintaining her own well-being. She has been surviving on protein bars and water for the first two weeks postpartum and is concerned about returning to work on  May 21st. She is currently not taking any medication for depression or anxiety but has a history of using Lexapro. She wants to resume this medication due to persistent sadness and emotional distress.  She reports experiencing an anal fissure, describing severe pain and bleeding  during bowel movements. She takes a stool softener daily but finds it ineffective, as her stools remain hard. She describes the pain as 'pins and needles' and notes that it feels like 'scratching that tear all over again'.     Past Medical History:  Diagnosis Date   Anxiety    Gestational diabetes    Low blood pressure    Low sodium levels     History reviewed. No pertinent surgical history.  Family History  Adopted: Yes  Family history unknown: Yes    Social History   Tobacco Use   Smoking status: Never   Smokeless tobacco: Never  Vaping Use   Vaping status: Never Used  Substance Use Topics   Alcohol use: Not Currently   Drug use: Not Currently    Types: Marijuana    Comment: none during preg     No Known Allergies  Review of Systems NEGATIVE UNLESS OTHERWISE INDICATED IN HPI      Objective:     BP 112/76 (BP Location: Left Arm, Patient Position: Sitting, Cuff Size: Small)   Pulse 91   Temp (!) 97.5 F (36.4 C) (Temporal)   Ht 5\' 3"  (1.6 m)   Wt 151 lb 3.2 oz (68.6 kg)   SpO2 99%   Breastfeeding Yes   BMI 26.78 kg/m   Wt Readings from Last 3 Encounters:  04/10/23 151 lb 3.2 oz (68.6 kg)  03/24/23 148 lb 12.8 oz (67.5 kg)  03/13/23 168 lb 9.6 oz (76.5 kg)    BP Readings from Last 3 Encounters:  04/10/23 112/76  03/24/23 132/69  03/17/23 133/86     Physical Exam Vitals and nursing note reviewed.  Constitutional:      Appearance: Normal appearance.  Eyes:     Extraocular Movements: Extraocular movements intact.     Conjunctiva/sclera: Conjunctivae normal.     Pupils: Pupils are equal, round, and reactive to light.  Cardiovascular:     Rate and Rhythm: Normal rate and regular rhythm.     Pulses: Normal pulses.     Heart sounds: Normal heart sounds. No murmur heard. Pulmonary:     Effort: Pulmonary effort is normal.     Breath sounds: Normal breath sounds.  Neurological:     General: No focal deficit present.     Mental Status: She is  alert and oriented to person, place, and time.  Psychiatric:        Mood and Affect: Mood normal.        Behavior: Behavior normal.             Nainika Newlun M Greyson Riccardi, PA-C

## 2023-04-15 DIAGNOSIS — F4312 Post-traumatic stress disorder, chronic: Secondary | ICD-10-CM | POA: Diagnosis not present

## 2023-04-16 ENCOUNTER — Other Ambulatory Visit: Payer: Self-pay

## 2023-04-16 DIAGNOSIS — R300 Dysuria: Secondary | ICD-10-CM | POA: Diagnosis not present

## 2023-04-16 MED ORDER — HYDROCORTISONE ACETATE 25 MG RE SUPP: 25.0000 mg | Freq: Two times a day (BID) | RECTAL | 0 refills | Status: DC

## 2023-04-18 ENCOUNTER — Other Ambulatory Visit: Payer: Self-pay | Admitting: Physician Assistant

## 2023-04-21 DIAGNOSIS — R509 Fever, unspecified: Secondary | ICD-10-CM | POA: Diagnosis not present

## 2023-04-21 DIAGNOSIS — R051 Acute cough: Secondary | ICD-10-CM | POA: Diagnosis not present

## 2023-04-21 DIAGNOSIS — Q825 Congenital non-neoplastic nevus: Secondary | ICD-10-CM | POA: Diagnosis not present

## 2023-04-21 DIAGNOSIS — R0981 Nasal congestion: Secondary | ICD-10-CM | POA: Diagnosis not present

## 2023-04-22 DIAGNOSIS — F4312 Post-traumatic stress disorder, chronic: Secondary | ICD-10-CM | POA: Diagnosis not present

## 2023-04-27 NOTE — Discharge Summary (Signed)
 Postpartum Discharge Summary  Date of Service updated 03-17-23     Patient Name: Terri Ramirez DOB: Jul 13, 1995 MRN: [id number]  Date of admission: 03/13/2023 Delivery date:03/15/2023 Delivering provider: GRICE, VIVIAN B Date of discharge: 04/27/2023  Admitting diagnosis: Normal labor and delivery [O80] Intrauterine pregnancy: [redacted]w[redacted]d     Secondary diagnosis:  Principal Problem:   Gestational diabetes mellitus (GDM) affecting first pregnancy Active Problems:   Normal labor and delivery   Gestational hypertension w/o significant proteinuria in 3rd trimester  Additional problems: GDM A2 and GHTN    Discharge diagnosis: Gestational Hypertension and GDM A2                                              Post partum procedures: none Augmentation: Pitocin Complications: None  Hospital course: Induction of Labor With Vaginal Delivery   28 y.o. yo G1P1001 at [redacted]w[redacted]d was admitted to the hospital 03/13/2023 for induction of labor.  Indication for induction: Gestational hypertension and A2 DM.  Patient had an labor course complicated by none Membrane Rupture Time/Date: 8:23 PM,03/14/2023  Delivery Method:Vaginal, Spontaneous Operative Delivery:N/A Episiotomy: None Lacerations:  Vaginal Details of delivery can be found in separate delivery note.  Patient had a postpartum course complicated by nothing. Patient is discharged home 04/27/23.  Newborn Data: Birth date:03/15/2023 Birth time:1:14 AM Gender:Female Living status:Living Apgars:8 ,9  Weight:3.657 kg  Magnesium Sulfate received: No BMZ received: No  Immunizations administered: Immunization History  Administered Date(s) Administered   Dtap, Unspecified 03/04/1997, 03/30/1997, 04/30/1997   HPV 9-valent 03/12/2022   Hep A, Unspecified 07/10/2005   Hep B, Unspecified 09/28/2004, 11/12/2004, 06/04/2005   Hepatitis A, Ped/Adol-2 Dose 05/04/2007   Hpv-Unspecified 08/25/2008, 10/26/2008   Influenza Split 10/26/2008   Influenza,inj,Quad  PF,6+ Mos 02/15/2022   Influenza-Unspecified 02/15/2022   MMR 09/28/2004, 07/10/2005   Measles 10/13/1998   Meningococcal Conjugate 08/25/2008   PFIZER(Purple Top)SARS-COV-2 Vaccination 04/22/2019, 05/17/2019   Polio, Unspecified 03/04/1997, 03/30/1997, 04/30/1997, 12/31/1997   Td 09/28/2004   Tdap 08/25/2008, 02/15/2022   Varicella 09/28/2004, 07/10/2005    Physical exam  Vitals:   03/16/23 1432 03/16/23 2007 03/17/23 0053 03/17/23 0629  BP: 136/80 129/83 130/85 133/86  Pulse: (!) 107 84 91 (!) 110  Resp: 16 20  18   Temp: 98.4 F (36.9 C) 97.8 F (36.6 C)  98.2 F (36.8 C)  TempSrc: Oral Oral  Oral  SpO2: 100% 100%  99%  Weight:      Height:       General: alert, cooperative, and no distress Lochia: appropriate Uterine Fundus: firm Incision: N/A DVT Evaluation: No evidence of DVT seen on physical exam. Labs: Lab Results  Component Value Date   WBC 11.7 (H) 03/24/2023   HGB 11.3 (L) 03/24/2023   HCT 34.3 (L) 03/24/2023   MCV 96.6 03/24/2023   PLT 349 03/24/2023      Latest Ref Rng & Units 03/24/2023    3:45 PM  CMP  Glucose 70 - 99 mg/dL 96   BUN 6 - 20 mg/dL 17   Creatinine 4.09 - 1.00 mg/dL 8.11   Sodium 914 - 782 mmol/L 137   Potassium 3.5 - 5.1 mmol/L 4.2   Chloride 98 - 111 mmol/L 103   CO2 22 - 32 mmol/L 24   Calcium 8.9 - 10.3 mg/dL 8.3   Total Protein 6.5 - 8.1 g/dL 6.8  Total Bilirubin 0.0 - 1.2 mg/dL 1.1   Alkaline Phos 38 - 126 U/L 114   AST 15 - 41 U/L 29   ALT 0 - 44 U/L 45    Edinburgh Score:    03/26/2023    1:51 PM  Edinburgh Postnatal Depression Scale Screening Tool  I have been able to laugh and see the funny side of things. 0  I have looked forward with enjoyment to things. 0  I have blamed myself unnecessarily when things went wrong. 0  I have been anxious or worried for no good reason. 1  I have felt scared or panicky for no good reason. 1  Things have been getting on top of me. 1  I have been so unhappy that I have had  difficulty sleeping. 0  I have felt sad or miserable. 1  I have been so unhappy that I have been crying. 1  The thought of harming myself has occurred to me. 0  Edinburgh Postnatal Depression Scale Total 5      After visit meds:  Allergies as of 03/17/2023   No Known Allergies      Medication List     STOP taking these medications    metFORMIN 500 MG tablet Commonly known as: GLUCOPHAGE       TAKE these medications    acetaminophen 325 MG tablet Commonly known as: Tylenol Take 2 tablets (650 mg total) by mouth every 6 (six) hours as needed.   cholecalciferol 25 MCG (1000 UNIT) tablet Commonly known as: VITAMIN D3 Take 1,000 Units by mouth daily.   ibuprofen 600 MG tablet Commonly known as: ADVIL Take 1 tablet (600 mg total) by mouth every 6 (six) hours as needed.   ondansetron 4 MG disintegrating tablet Commonly known as: ZOFRAN-ODT Take 1 tablet (4 mg total) by mouth every 6 (six) hours as needed for nausea.   oxycodone 5 MG capsule Commonly known as: OXY-IR Take 1 capsule (5 mg total) by mouth every 4 (four) hours as needed for pain (headache).   prenatal multivitamin Tabs tablet Take 1 tablet by mouth daily at 12 noon.   promethazine 25 MG tablet Commonly known as: PHENERGAN Take 1 tablet (25 mg total) by mouth every 6 (six) hours as needed for nausea or vomiting.         Discharge home in stable condition Infant Feeding:  unsure Infant Disposition:home with mother Discharge instruction: per After Visit Summary and Postpartum booklet. Activity: Advance as tolerated. Pelvic rest for 6 weeks.  Diet: routine diet Anticipated Birth Control: Unsure Postpartum Appointment:6 weeks Additional Postpartum F/U: BP check 1 week Future Appointments:No future appointments. Follow up Visit:  Follow-up Information     [facility]. Schedule an appointment as soon as possible for a visit in 1 week(s).   Specialty: Obstetrics and  Gynecology Why: For BP check and EPDS Contact information: [address] [place]  [address] [phone]        [facility]. Schedule an appointment as soon as possible for a visit in 6 week(s).   Specialty: Obstetrics and Gynecology Why: For Postpartum follow-up Contact information: [address] [place]  [address] [phone]                    04/27/2023 Madelene Schanz, MD

## 2023-05-01 DIAGNOSIS — K219 Gastro-esophageal reflux disease without esophagitis: Secondary | ICD-10-CM | POA: Diagnosis not present

## 2023-05-01 DIAGNOSIS — R0681 Apnea, not elsewhere classified: Secondary | ICD-10-CM | POA: Diagnosis not present

## 2023-05-02 DIAGNOSIS — Z1331 Encounter for screening for depression: Secondary | ICD-10-CM | POA: Diagnosis not present

## 2023-05-06 DIAGNOSIS — F4312 Post-traumatic stress disorder, chronic: Secondary | ICD-10-CM | POA: Diagnosis not present

## 2023-05-19 DIAGNOSIS — F4312 Post-traumatic stress disorder, chronic: Secondary | ICD-10-CM | POA: Diagnosis not present

## 2023-05-22 DIAGNOSIS — N898 Other specified noninflammatory disorders of vagina: Secondary | ICD-10-CM | POA: Diagnosis not present

## 2023-05-26 DIAGNOSIS — F4312 Post-traumatic stress disorder, chronic: Secondary | ICD-10-CM | POA: Diagnosis not present

## 2023-05-27 DIAGNOSIS — R21 Rash and other nonspecific skin eruption: Secondary | ICD-10-CM | POA: Diagnosis not present

## 2023-06-04 DIAGNOSIS — F4312 Post-traumatic stress disorder, chronic: Secondary | ICD-10-CM | POA: Diagnosis not present

## 2023-06-11 DIAGNOSIS — F4312 Post-traumatic stress disorder, chronic: Secondary | ICD-10-CM | POA: Diagnosis not present

## 2023-06-18 DIAGNOSIS — F4312 Post-traumatic stress disorder, chronic: Secondary | ICD-10-CM | POA: Diagnosis not present

## 2023-06-21 ENCOUNTER — Other Ambulatory Visit: Payer: Self-pay | Admitting: Family Medicine

## 2023-06-24 DIAGNOSIS — F4312 Post-traumatic stress disorder, chronic: Secondary | ICD-10-CM | POA: Diagnosis not present

## 2023-07-02 DIAGNOSIS — F4312 Post-traumatic stress disorder, chronic: Secondary | ICD-10-CM | POA: Diagnosis not present

## 2023-07-07 ENCOUNTER — Ambulatory Visit: Admitting: Physician Assistant

## 2023-07-15 DIAGNOSIS — F4312 Post-traumatic stress disorder, chronic: Secondary | ICD-10-CM | POA: Diagnosis not present

## 2023-07-17 ENCOUNTER — Telehealth: Payer: Self-pay | Admitting: Physician Assistant

## 2023-07-17 ENCOUNTER — Telehealth: Payer: Self-pay

## 2023-07-17 ENCOUNTER — Encounter: Payer: Self-pay | Admitting: Physician Assistant

## 2023-07-17 VITALS — Ht 63.0 in | Wt 151.0 lb

## 2023-07-17 DIAGNOSIS — F53 Postpartum depression: Secondary | ICD-10-CM

## 2023-07-17 MED ORDER — ESCITALOPRAM OXALATE 20 MG PO TABS: 20.0000 mg | ORAL_TABLET | Freq: Every day | ORAL | 1 refills | Status: DC

## 2023-07-17 NOTE — Telephone Encounter (Signed)
 Copied from CRM [id number]. Topic: Appointments - Scheduling Inquiry for Clinic >> Jul 17, 2023  9:30 AM [place] M wrote: Reason for CRM: Patient missed call this morning with Alyssa Allwardt, returning a voicemail that left to her to call back  Ok to leave pt on schedule in case of no show or time to complete visit later today? Or reschedule?

## 2023-07-17 NOTE — Progress Notes (Signed)
 Virtual Visit via Video Note  I connected with  Terri Ramirez  on 07/17/23 at 11:30 AM EDT by a video enabled telemedicine application and verified that I am speaking with the correct person using two identifiers.  Location: Patient: home Provider: Nature conservation officer at [employer] Persons present: Patient and myself   I discussed the limitations of evaluation and management by telemedicine and the availability of in person appointments. The patient expressed understanding and agreed to proceed.   History of Present Illness:  Discussed the use of AI scribe software for clinical note transcription with the patient, who gave verbal consent to proceed.  History of Present Illness Terri Ramirez is a 28 year old female who presents with postpartum depression and anxiety.  She has been experiencing significant postpartum depression and anxiety since the birth of her child four months ago. She describes feeling 'in autopilot mode' and 'in survival mode,' with a lack of happiness despite smiling for her child. She feels overwhelmed by her responsibilities and often finds herself crying due to stress. She experiences frequent dissociation and feels out of touch with herself, often feeling numb throughout the day. She is concerned about her mental health and the impact it may have on her ability to care for her child.  She has been taking Lexapro  but occasionally misses doses. Her support system is minimal, with her child's father only occasionally checking in and a friend who helps with childcare while she works. She feels isolated and overwhelmed, especially when her child cries inconsolably despite her efforts to soothe her.  She is concerned about her weight, noting that she has returned to her pregnancy weight, which is the heaviest she has ever been. This has affected her self-esteem and willingness to go out, as she feels uncomfortable in her clothes. She is breastfeeding and is  determined to continue despite the challenges.  Her work schedule consists of 12-hour night shifts on a 2-2-3 schedule, which she finds stressful as she constantly worries about her child's well-being while she is away. She feels torn between needing a break and missing her child when she is not with her. Her daily routine lacks structure, with her time consumed by caring for her child and dog. She struggles to find time for self-care, often feeling too exhausted to eat or shower. She relies on quick snacks like granola bars and oatmeal due to time constraints, which she believes contributes to her weight gain.     Observations/Objective:   Gen: Awake, alert, no acute distress Resp: Breathing is even and non-labored Psych: calm/pleasant demeanor Neuro: Alert and Oriented x 3, + facial symmetry, speech is clear.   Assessment and Plan:  Assessment & Plan Postpartum Depression Terri Ramirez exhibits symptoms of postpartum depression, including sadness, feeling overwhelmed, disassociation, and autopilot mode. She struggles with her support system and managing new motherhood responsibilities. Her therapist recommended medication consultation. She is on Lexapro  but inconsistently. Increasing Lexapro  dosage was discussed to manage depression symptoms, with concerns about dissociation and numbing effects. Wellbutrin was considered but not added due to breastfeeding risks, including a small risk of seizures in infants. - Increase Lexapro  dosage and monitor effects - Focus on nutrition, hydration, and vitamin intake - Consider counseling and support groups for postpartum depression - Reassess in 4-6 weeks or sooner if needed  Postpartum Weight Gain Terri Ramirez is concerned about weight gain, returning to pregnancy weight, discomfort with body image, and difficulty fitting into clothes. Weight gain is attributed to erratic  sleep patterns, work schedule, and irregular eating habits. Advised to delay weight loss  focus until at least one year postpartum and improve nutrition and hydration. - Focus on nutrition and hydration - Delay weight loss focus until at least one year postpartum  General Health Maintenance Terri Ramirez is breastfeeding and concerned about maintaining milk supply. Advised to focus on nutrition and hydration to support breastfeeding and avoid medications that could affect milk supply. - Maintain breastfeeding - Focus on nutrition and hydration to support milk supply  Follow-up Plan to follow up in 4-6 weeks to assess progress and medication effects. Encouraged to reach out sooner if needed. - Schedule follow-up appointment in 4-6 weeks - Reach out sooner if needed   Follow Up Instructions:    I discussed the assessment and treatment plan with the patient. The patient was provided an opportunity to ask questions and all were answered. The patient agreed with the plan and demonstrated an understanding of the instructions.   The patient was advised to call back or seek an in-person evaluation if the symptoms worsen or if the condition fails to improve as anticipated.  Kazzandra Desaulniers M Robertt Buda, PA-C

## 2023-07-17 NOTE — Telephone Encounter (Signed)
 Noted and agreed, rescheduled late today.

## 2023-07-23 ENCOUNTER — Ambulatory Visit: Admitting: Physician Assistant

## 2023-07-24 ENCOUNTER — Ambulatory Visit: Admitting: Physician Assistant

## 2023-07-31 DIAGNOSIS — F4312 Post-traumatic stress disorder, chronic: Secondary | ICD-10-CM | POA: Diagnosis not present

## 2023-08-08 DIAGNOSIS — F4312 Post-traumatic stress disorder, chronic: Secondary | ICD-10-CM | POA: Diagnosis not present

## 2023-08-13 DIAGNOSIS — F4312 Post-traumatic stress disorder, chronic: Secondary | ICD-10-CM | POA: Diagnosis not present

## 2023-08-27 DIAGNOSIS — F4312 Post-traumatic stress disorder, chronic: Secondary | ICD-10-CM | POA: Diagnosis not present

## 2023-09-01 DIAGNOSIS — F4312 Post-traumatic stress disorder, chronic: Secondary | ICD-10-CM | POA: Diagnosis not present

## 2023-09-02 IMAGING — CR DG CHEST 2V
2 series · 2 of 2 positions shown · non-contrast
Comparison: November 17, 2018 chest x-ray.

CLINICAL DATA: Chest pain.

EXAM:
CHEST - 2 VIEW

[w chest pa]
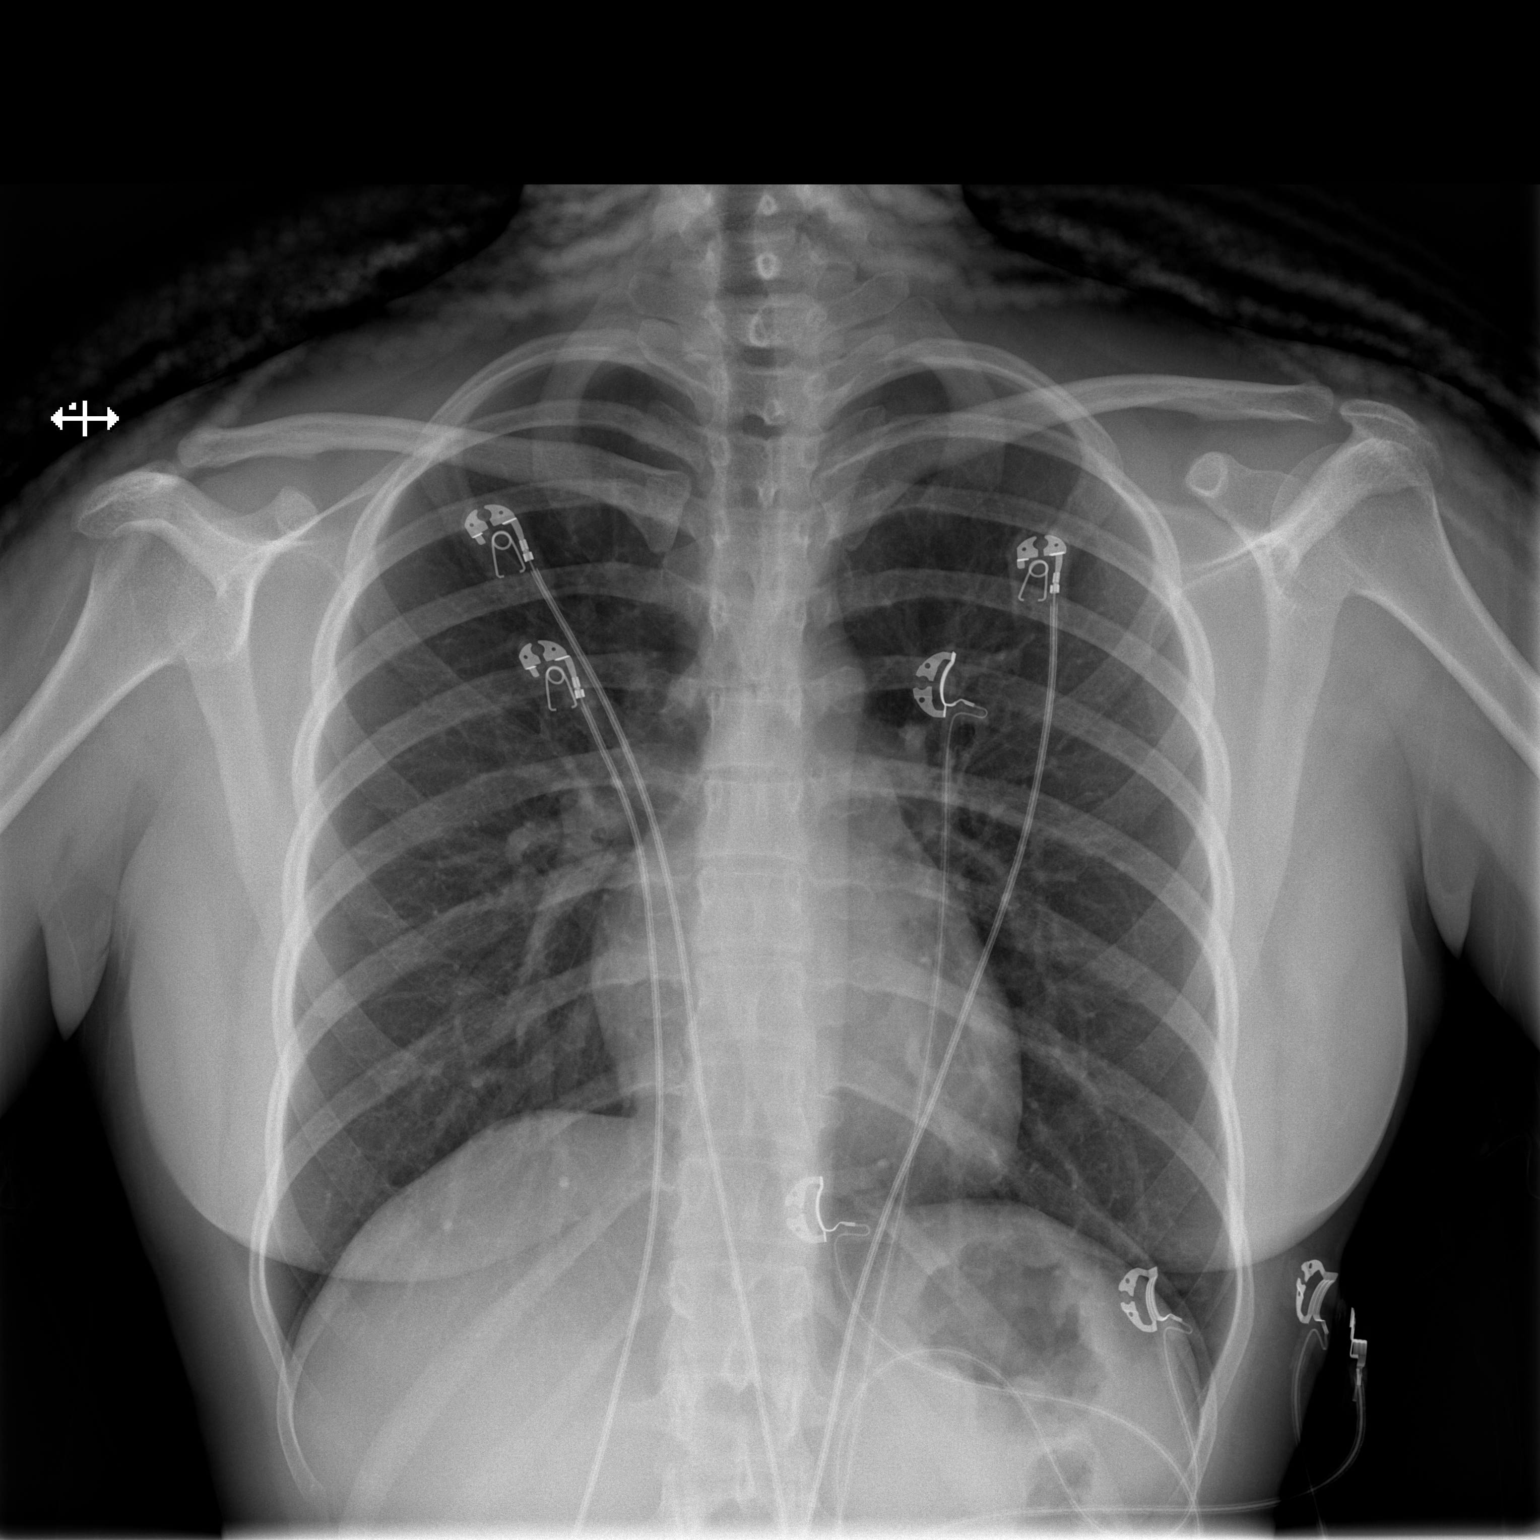

[w chest lat]
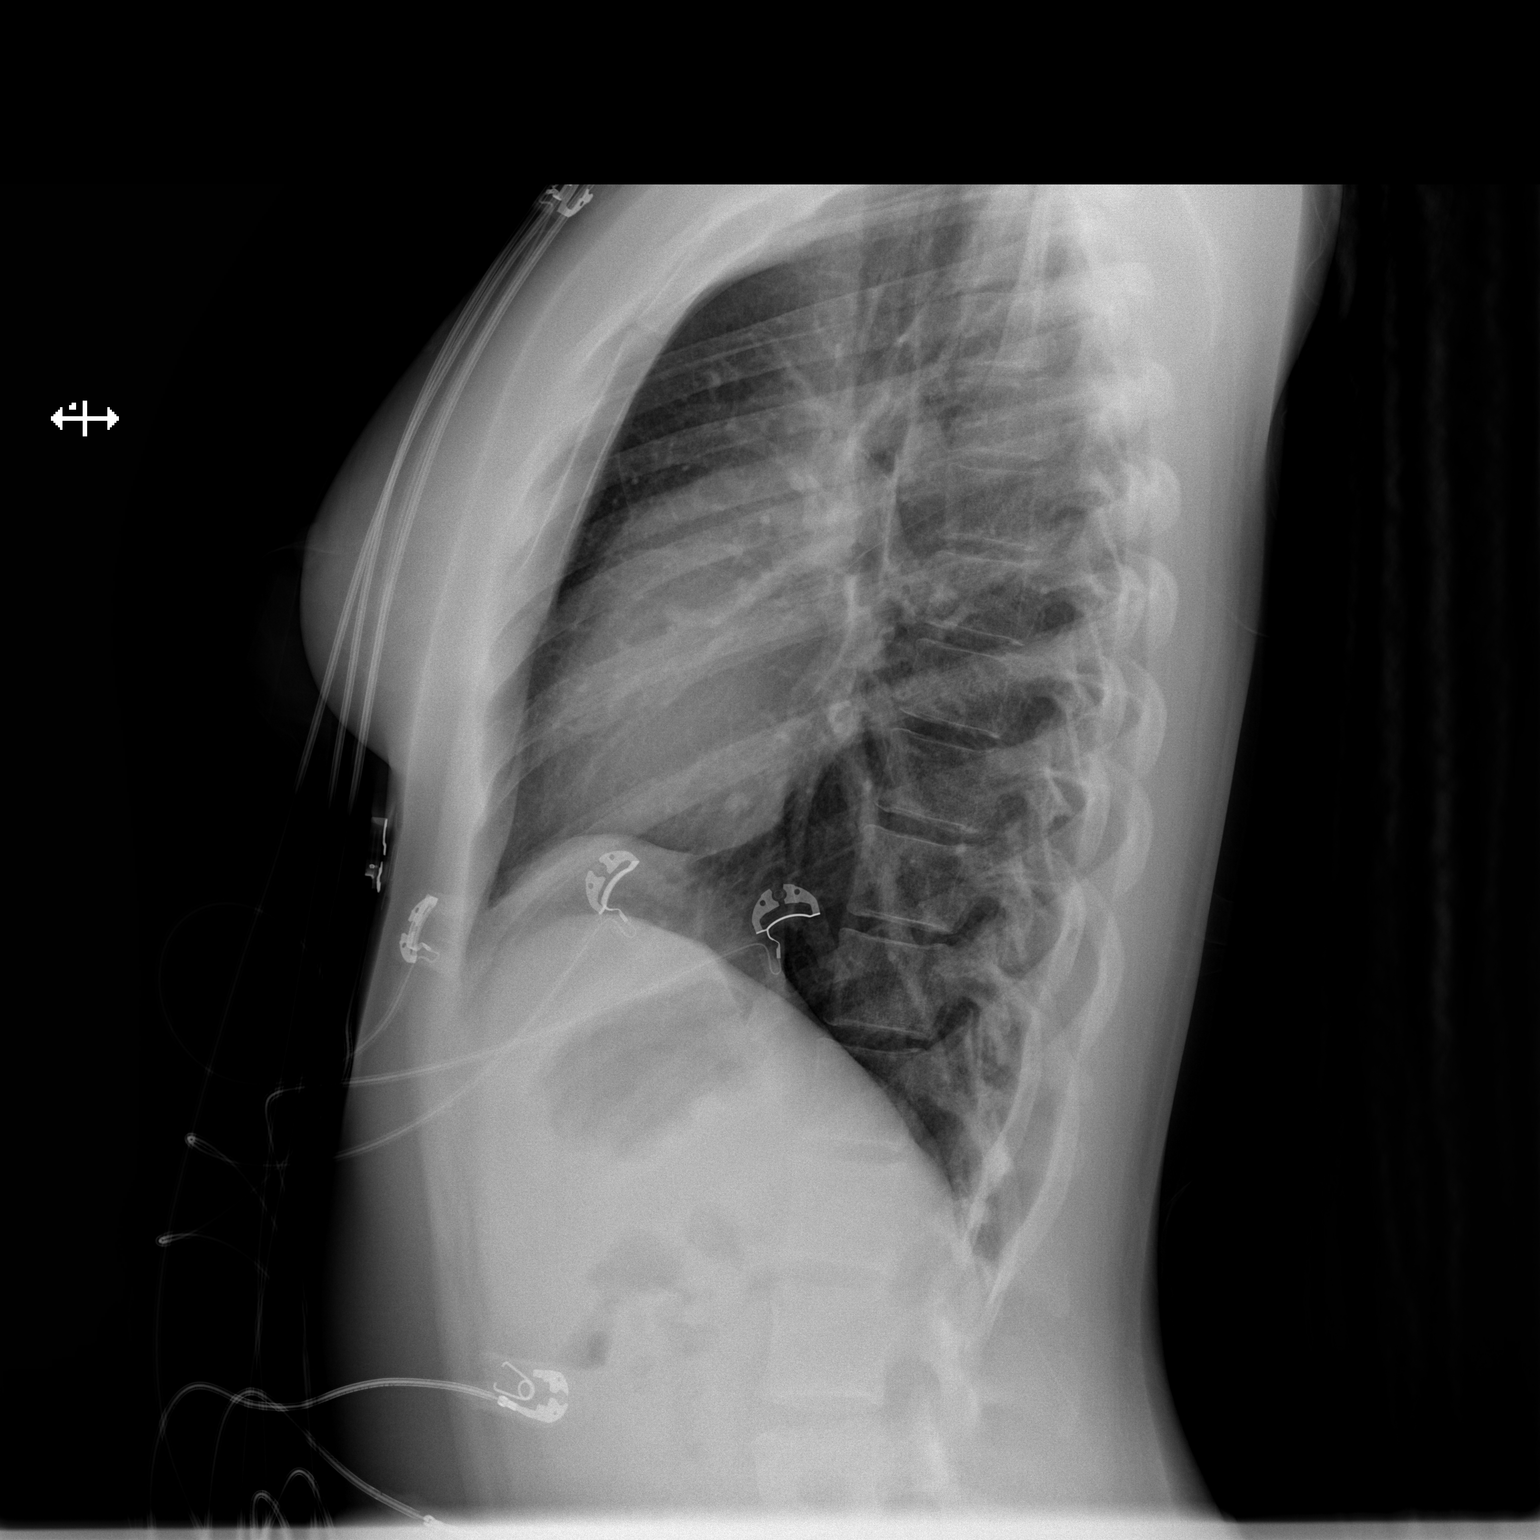

[2 of 2 positions shown; findings below may reference images not displayed]

FINDINGS: No consolidation. No visible pleural effusions or pneumothorax.
Cardiomediastinal silhouette is within normal limits. No displaced
fracture.
IMPRESSION: No evidence of acute cardiopulmonary disease.

## 2023-09-08 ENCOUNTER — Ambulatory Visit: Payer: Self-pay

## 2023-09-08 ENCOUNTER — Telehealth: Admitting: Family Medicine

## 2023-09-08 ENCOUNTER — Encounter: Payer: Self-pay | Admitting: Family Medicine

## 2023-09-08 ENCOUNTER — Ambulatory Visit: Admitting: Family Medicine

## 2023-09-08 ENCOUNTER — Encounter: Payer: Self-pay | Admitting: Physician Assistant

## 2023-09-08 DIAGNOSIS — M79645 Pain in left finger(s): Secondary | ICD-10-CM

## 2023-09-08 NOTE — Telephone Encounter (Signed)
 FYI Only or Action Required?: FYI only for provider.  Patient was last seen in primary care on 07/17/2023.  Called Nurse Triage reporting Finger Injury.  Symptoms began several days ago.  Interventions attempted: Nothing.  Symptoms are: unchanged.  Triage Disposition: See PCP When Office is Open (Within 3 Days)  Patient/caregiver understands and will follow disposition?: Yes   Copied from CRM [id number]. Topic: Clinical - Red Word Triage >> Sep 08, 2023 12:21 PM Terri Ramirez wrote: [facility] that prompted transfer to Nurse Triage: Patient hit her left pointer finger on something and now she is having a lot of pain. Patient feels like it's bruised.  ----------------------------------------------------------------------- From previous Reason for Contact - Scheduling: Patient/patient representative is calling to schedule an appointment. Refer to attachments for appointment information. Reason for Disposition  [1] After 3 days AND [2] pain not improved  Answer Assessment - Initial Assessment Questions 1. MECHANISM: How did the injury happen?      States that she hit her finger up against her my locker 2. ONSET: When did the injury happen? (e.g., minutes, hours ago)      Four days ago 3. LOCATION: What part of the finger is injured? Is the nail damaged?      Left index finger 4. APPEARANCE of the INJURY: What does the injury look like?      Denies swelling, redness, bruising 5. SEVERITY: Can you use the hand normally?  Can you bend your fingers into a ball and then fully open them?     Pain with finger flexion 6. SIZE: For cuts, bruises, or swelling, ask: How large is it? (e.g., inches or centimeters;  entire finger)      N/A 7. PAIN: Is there pain? If Yes, ask: How bad is the pain?  (Scale 0-10; or none, mild, moderate, severe)     Pain 5/10 with movement or pressure 8. TETANUS: For any breaks in the skin, ask: When was your last tetanus booster?     N/A 9. OTHER  SYMPTOMS: Do you have any other symptoms?     N/A 10. PREGNANCY: Is there any chance you are pregnant? When was your last menstrual period?       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

## 2023-09-08 NOTE — Progress Notes (Signed)
 MyChart Video Visit Virtual Visit via Video Note   This visit type was conducted w/patient consent. This format is felt to be most appropriate for this patient at this time. Physical exam was limited by quality of the video and audio technology used for the visit. CMA was able to get the patient set up on a video visit.  Patient location: Home. Patient and provider in visit Provider location: Office  I discussed the limitations of evaluation and management by telemedicine and the availability of in person appointments. The patient expressed understanding and agreed to proceed.  Visit Date: 09/08/2023  Today's healthcare provider: Jenkins CHRISTELLA Carrel, MD     Subjective:    Patient ID: Terri Ramirez, female    DOB: Nov 20, 1995, 28 y.o.   MRN: [id number]  Chief Complaint  Patient presents with   Finger Injury    Left pointer finger, closed walker on it on Friday, still having pain  Need note to stay out of work a few more days, was told by Alyssa to do a vv    HPI Discussed the use of AI scribe software for clinical note transcription with the patient, who gave verbal consent to proceed.  History of Present Illness Terri Ramirez is a 28 year old female who presents with left index finger pain after a locker injury.  On Friday, she accidentally shut a locker door on her left index finger at work, resulting in immediate pain. Initially, the finger was very painful to touch, even with clothing, and was slightly bruised for the first two days. No swelling, cut, or bleeding was noted.  Currently, the pain has decreased but remains slightly painful to touch. The pain is more pronounced when lifting the finger upwards, described as 'really good pain,' but is less painful when moving the finger downwards. She is able to bend the finger.  She has been managing the pain by icing the finger and avoiding pressure on it. She has not been taking any medication due to breastfeeding her five-month-old  baby. 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 She has been off work for the past two days and is seeking an additional day off to allow the finger to rest.    Past Medical History:  Diagnosis Date   Anxiety    Gestational diabetes    Low blood pressure    Low sodium levels     History reviewed. No pertinent surgical history.  Outpatient Medications Prior to Visit  Medication Sig Dispense Refill   acetaminophen  (TYLENOL ) 500 MG tablet Take 500 mg by mouth every 6 (six) hours as needed.     escitalopram  (LEXAPRO ) 20 MG tablet Take 1 tablet (20 mg total) by mouth daily. 30 tablet 1   hydrocortisone  (ANUSOL -HC) 25 MG suppository PLACE 1 SUPPOSITORY RECTALLY 2 TIMES DAILY. 12 suppository 0   ibuprofen  (ADVIL ) 600 MG tablet Take 1 tablet (600 mg total) by mouth every 6 (six) hours as needed. 40 tablet 1   NIFEdipine  (PROCARDIA -XL/NIFEDICAL-XL) 30 MG 24 hr tablet Take 1 tablet (30 mg total) by mouth daily. 30 tablet 2   Prenatal Vit-Fe Fumarate-FA (PRENATAL MULTIVITAMIN) TABS tablet Take 1 tablet by mouth daily at 12 noon.     senna-docusate (SENOKOT-S) 8.6-50 MG tablet Take 2 tablets by mouth daily as needed for mild constipation. 60 tablet 1   No facility-administered medications prior to visit.    No Known Allergies  Objective:     Physical Exam  Vitals and nursing note reviewed.  Constitutional:      General:  is not in acute distress.    Appearance: Normal appearance.  HENT:     Head: Normocephalic.  Pulmonary:     Effort: No respiratory distress.  Skin:    General: Skin is dry.     Coloration: Skin is not pale.  Neurological:     Mental Status: Pt is alert and oriented to person, place, and time.  Psychiatric:        Mood and Affect: Mood normal.  L index finger-good rom but reports some pain w/ hyperextension.  No obvious deformity appreciated but limitiations by video  visit  Breastfeeding No -yes per pt  Wt Readings from Last 3 Encounters:  07/17/23 151 lb (68.5 kg)  04/10/23 151 lb 3.2 oz (68.6 kg)  03/24/23 148 lb 12.8 oz (67.5 kg)       Assessment & Plan:   Problem List Items Addressed This Visit   None Visit Diagnoses       Finger pain, left    -  Primary     Assessment and Plan Assessment & Plan Left index finger contusion from locker injury   The left index finger contusion resulted from a locker injury sustained on Friday. Initial significant pain and bruising have improved, with current symptoms including mild pain on touch and certain movements, but no swelling, cuts, or bleeding. The ability to bend the finger without significant pain suggests no fracture and improving. Pain increases when lifting the finger upwards, indicating possible soft tissue involvement rather than joint or bone injury. She is breastfeeding and has avoided medication, which may affect pain perception. Advise rest and elevation of the finger to reduce swelling and pain. Recommend alternating acetaminophen  [phone] mg as per instructions) and ibuprofen  (400-600 mg every 6 hours as needed) for pain management, ensuring compatibility with breastfeeding. Provide a note for a day off work to allow for rest and recovery tonight.  Return tomorrow. Instruct to report if pain worsens or does not improve.    No orders of the defined types were placed in this encounter.   I discussed the assessment and treatment plan with the patient. The patient was provided an opportunity to ask questions and all were answered. The patient agreed with the plan and demonstrated an understanding of the instructions.   The patient was advised to call back or seek an in-person evaluation if the symptoms worsen or if the condition fails to improve as anticipated.  Return if symptoms worsen or fail to improve.  Jenkins CHRISTELLA Carrel, MD [facility] [phone]  (phone) [phone] (fax)  [facility]

## 2023-09-08 NOTE — Telephone Encounter (Signed)
 Copied from CRM [id number]. Topic: Clinical - Red Word Triage >> Sep 08, 2023 12:21 PM [place] J wrote: [facility] that prompted transfer to Nurse Triage: Patient hit her left pointer finger on something and now she is having a lot of pain. Patient feels like it's bruised.  ----------------------------------------------------------------------- From previous Reason for Contact - Scheduling: Patient/patient representative is calling to schedule an appointment. Refer to attachments for appointment information. >> Sep 08, 2023  2:43 PM [place] J wrote: Patient calling to reschedule appointment with Alyssa per CAL. Reason for Disposition . Finger joint can't be opened (straightened) or closed (bent) completely  (Note: Injured person should be able to do this without assistance.)  Answer Assessment - Initial Assessment Questions 1. ONSET: When did the pain start?      Thursday 2. LOCATION and RADIATION: Where is the pain located?  (e.g., fingertip, around nail, joint, entire      L pointer finger 3. SEVERITY: How bad is the pain? What does it keep you from doing?   (Scale 1-10; or mild, moderate, severe)     5/10 Has not taken any OTC meds d/t breastfeeding 4. APPEARANCE: What does the finger look like? (e.g., redness, swelling, bruising, pallor)     Pain to touch, denies other sx 5. WORK OR EXERCISE: Has there been any recent work or exercise that involved this part (i.e., fingers or hand) of the body?     Yes, hit on something 6. CAUSE: What do you think is causing the pain?     Injury - endorses normal ROM of finger 7. AGGRAVATING FACTORS: What makes the pain worse? (e.g., using computer)     Movement, touch 8. OTHER SYMPTOMS: Do you have any other symptoms? (e.g., fever, neck pain, numbness)     denies 9. PREGNANCY: Is there any chance you are pregnant? When was your last menstrual period?     Currently 5 mos post-partum  Protocols used: Finger Pain-A-AH, Finger  Injury-A-AH  This encounter was created in error - please disregard.

## 2023-09-08 NOTE — Patient Instructions (Signed)
 Ibuprofen  400-600mg  every 6 hrs as needed  Tylenol  per instructions

## 2023-09-08 NOTE — Telephone Encounter (Signed)
 Please see triage note regarding patient; pt also sent MyChart msg

## 2023-09-08 NOTE — Telephone Encounter (Signed)
 Please see pt msg

## 2023-09-08 NOTE — Telephone Encounter (Signed)
 Reason for Disposition . Health information question, no triage required and triager able to answer question  Answer Assessment - Initial Assessment Questions 1. REASON FOR CALL: What is the main reason for your call? or How can I best help you?     Pt wanting to change her AV today to virtual d/t childcare 2. SYMPTOMS : Do you have any symptoms?      See alternate triage 3. OTHER QUESTIONS: Do you have any other questions?     N/a  Protocols used: Information Only Call - No Triage-A-AH

## 2023-09-10 DIAGNOSIS — F4312 Post-traumatic stress disorder, chronic: Secondary | ICD-10-CM | POA: Diagnosis not present

## 2023-09-16 DIAGNOSIS — F4312 Post-traumatic stress disorder, chronic: Secondary | ICD-10-CM | POA: Diagnosis not present

## 2023-09-25 DIAGNOSIS — F4312 Post-traumatic stress disorder, chronic: Secondary | ICD-10-CM | POA: Diagnosis not present

## 2023-09-30 DIAGNOSIS — F4312 Post-traumatic stress disorder, chronic: Secondary | ICD-10-CM | POA: Diagnosis not present

## 2023-10-01 DIAGNOSIS — Z01419 Encounter for gynecological examination (general) (routine) without abnormal findings: Secondary | ICD-10-CM | POA: Diagnosis not present

## 2023-10-01 DIAGNOSIS — N898 Other specified noninflammatory disorders of vagina: Secondary | ICD-10-CM | POA: Diagnosis not present

## 2023-10-01 DIAGNOSIS — Z8632 Personal history of gestational diabetes: Secondary | ICD-10-CM | POA: Diagnosis not present

## 2023-10-01 LAB — HEMOGLOBIN A1C: Hemoglobin A1C: 10.3

## 2023-10-02 ENCOUNTER — Ambulatory Visit: Admitting: Physician Assistant

## 2023-10-02 ENCOUNTER — Encounter: Payer: Self-pay | Admitting: Physician Assistant

## 2023-10-02 VITALS — BP 110/80 | HR 82 | Temp 97.3°F | Ht 63.0 in | Wt 175.5 lb

## 2023-10-02 DIAGNOSIS — R739 Hyperglycemia, unspecified: Secondary | ICD-10-CM | POA: Diagnosis not present

## 2023-10-02 DIAGNOSIS — E119 Type 2 diabetes mellitus without complications: Secondary | ICD-10-CM | POA: Diagnosis not present

## 2023-10-02 LAB — COMPREHENSIVE METABOLIC PANEL WITH GFR
ALT: 21 U/L (ref 0–35)
AST: 18 U/L (ref 0–37)
Albumin: 4.3 g/dL (ref 3.5–5.2)
Alkaline Phosphatase: 145 U/L — ABNORMAL HIGH (ref 39–117)
BUN: 6 mg/dL (ref 6–23)
CO2: 27 meq/L (ref 19–32)
Calcium: 9.7 mg/dL (ref 8.4–10.5)
Chloride: 100 meq/L (ref 96–112)
Creatinine, Ser: 0.54 mg/dL (ref 0.40–1.20)
GFR: 126.75 mL/min (ref >60.00)
Glucose, Bld: 305 mg/dL — ABNORMAL HIGH (ref 70–99)
Potassium: 4.2 meq/L (ref 3.5–5.1)
Sodium: 132 meq/L — ABNORMAL LOW (ref 135–145)
Total Bilirubin: 0.4 mg/dL (ref 0.2–1.2)
Total Protein: 7.4 g/dL (ref 6.0–8.3)

## 2023-10-02 LAB — CBC WITH DIFFERENTIAL/PLATELET
Basophils Absolute: 0 K/uL (ref 0.0–0.1)
Basophils Relative: 0.5 % (ref 0.0–3.0)
Eosinophils Absolute: 0.1 K/uL (ref 0.0–0.7)
Eosinophils Relative: 1.4 % (ref 0.0–5.0)
HCT: 41.1 % (ref 36.0–46.0)
Hemoglobin: 13.8 g/dL (ref 12.0–15.0)
Lymphocytes Relative: 28.2 % (ref 12.0–46.0)
Lymphs Abs: 1.9 K/uL (ref 0.7–4.0)
MCHC: 33.7 g/dL (ref 30.0–36.0)
MCV: 89.7 fl (ref 78.0–100.0)
Monocytes Absolute: 0.4 K/uL (ref 0.1–1.0)
Monocytes Relative: 5.8 % (ref 3.0–12.0)
Neutro Abs: 4.4 K/uL (ref 1.4–7.7)
Neutrophils Relative %: 64.1 % (ref 43.0–77.0)
Platelets: 259 K/uL (ref 150.0–400.0)
RBC: 4.58 Mil/uL (ref 3.87–5.11)
RDW: 13.1 % (ref 11.5–15.5)
WBC: 6.8 K/uL (ref 4.0–10.5)

## 2023-10-02 LAB — MICROALBUMIN / CREATININE URINE RATIO
Creatinine,U: 44.2 mg/dL
Microalb Creat Ratio: UNDETERMINED mg/g (ref 0.0–30.0)
Microalb, Ur: <0.7 mg/dL

## 2023-10-02 LAB — TSH: TSH: 1.33 u[IU]/mL (ref 0.35–5.50)

## 2023-10-02 MED ORDER — INSULIN GLARGINE 100 UNIT/ML SOLOSTAR PEN: PEN_INJECTOR | SUBCUTANEOUS | 11 refills | Status: AC

## 2023-10-02 MED ORDER — METFORMIN HCL 500 MG PO TABS
500.0000 mg | ORAL_TABLET | Freq: Every day | ORAL | 1 refills | Status: DC
Start: 2023-10-02 — End: 2023-10-27

## 2023-10-02 NOTE — Progress Notes (Signed)
 Terri Ramirez is a 28 y.o. female here for a new problem.  History of Present Illness:   Chief Complaint  Patient presents with   Diabetes    Pt is here to f/u on A1c of 10.3 done yesterday. Newly Diabetic    Discussed the use of AI scribe software for clinical note transcription with the patient, who gave verbal consent to proceed.  History of Present Illness Terri Ramirez is a 28 year old female with gestational diabetes who presents with elevated blood sugar levels postpartum.  During her pregnancy, she had gestational diabetes and hypertension, leading to delivery at 38 weeks. She was on metformin  500 mg twice daily and monitored her blood sugars, which were consistently high.   Yesterday, she was at her annual obstetrics-gynecology appointment and an A1c was performed, showing result of 10.3%.  She is exclusively breastfeeding and experiences constant hunger. Her diet includes high carbohydrate intake, with frequent consumption of rice, oatmeal, and Oreos. She drinks a lot of water and Body Armor drinks, with occasional sodas. She works night shifts in a warehouse, contributing to fatigue and dietary choices. She does often eat high protein foods including salmon, baked chicken, as time permits.  She experiences increased thirst and urination, mental fog, difficulty concentrating, and unintentional weight gain, returning to her pregnancy weight. She manages her responsibilities alone, including caring for her six-month-old baby, and reports high stress levels. She is considering a job change due to challenges with her current work schedule.  She he has 20 pounds weight gain in the past two months.  Past Medical History:  Diagnosis Date   Anxiety    Gestational diabetes    Low blood pressure    Low sodium levels      Social History   Tobacco Use   Smoking status: Never   Smokeless tobacco: Never  Vaping Use   Vaping status: Never Used  Substance Use Topics   Alcohol  use: Not Currently   Drug use: Not Currently    Types: Marijuana    Comment: none during preg    No past surgical history on file.  Family History  Adopted: Yes  Family history unknown: Yes    No Known Allergies  Current Medications:   Current Outpatient Medications:    acetaminophen  (TYLENOL ) 500 MG tablet, Take 500 mg by mouth every 6 (six) hours as needed., Disp: , Rfl:    escitalopram  (LEXAPRO ) 20 MG tablet, Take 1 tablet (20 mg total) by mouth daily., Disp: 30 tablet, Rfl: 1   ibuprofen  (ADVIL ) 600 MG tablet, Take 1 tablet (600 mg total) by mouth every 6 (six) hours as needed., Disp: 40 tablet, Rfl: 1   insulin  glargine (LANTUS ) 100 UNIT/ML Solostar Pen, Take 10 units, increase by 2 units every 3 days until fasting blood sugars are under 120 units., Disp: 15 mL, Rfl: 11   metFORMIN  (GLUCOPHAGE ) 500 MG tablet, Take 1 tablet (500 mg total) by mouth daily with breakfast., Disp: 90 tablet, Rfl: 1   Prenatal Vit-Fe Fumarate-FA (PRENATAL MULTIVITAMIN) TABS tablet, Take 1 tablet by mouth daily at 12 noon., Disp: , Rfl:    senna-docusate (SENOKOT-S) 8.6-50 MG tablet, Take 2 tablets by mouth daily as needed for mild constipation., Disp: 60 tablet, Rfl: 1   Review of Systems:   Negative unless otherwise specified per HPI.  Vitals:   Vitals:   10/02/23 1109  BP: 110/80  Pulse: 82  Temp: (!) 97.3 F (36.3 C)  TempSrc: Temporal  SpO2: 99%  Weight: 175 lb 8 oz (79.6 kg)  Height: 5' 3 (1.6 m)     Body mass index is 31.09 kg/m.  Physical Exam:   Physical Exam Vitals and nursing note reviewed.  Constitutional:      General: She is not in acute distress.    Appearance: She is well-developed. She is not ill-appearing or toxic-appearing.  Cardiovascular:     Rate and Rhythm: Normal rate and regular rhythm.     Pulses: Normal pulses.     Heart sounds: Normal heart sounds, S1 normal and S2 normal.  Pulmonary:     Effort: Pulmonary effort is normal.     Breath sounds: Normal  breath sounds.  Skin:    General: Skin is warm and dry.  Neurological:     Mental Status: She is alert.     GCS: GCS eye subscore is 4. GCS verbal subscore is 5. GCS motor subscore is 6.  Psychiatric:        Speech: Speech normal.        Behavior: Behavior normal. Behavior is cooperative.     Assessment and Plan:   Assessment and Plan Assessment & Plan New onset diabetes mellitus  Newly diagnosed with A1c of 10.3%. History of gestational diabetes. Breastfeeding limits medication options. Medication required due to high A1c. Dietary changes needed to reduce carbohydrates and increase protein. - Provided samples of continuous glucose monitor for blood sugar monitoring. She was given samples of a FreeStyle Libre 3 Plus monitor and one was placed on her today.  - Developed medication plan safe for breastfeeding. Start Lantus  daily insulin  Check blood sugar after sleeping, if your fasting blood sugar is > 120, please administer 10 units of insulin  Continue this pattern daily If your blood sugar is consistently elevated for 3 days in a row, increase insulin  by 2 units  Continue pattern and increase by 2 units every 3 days until you have fasting blood sugars less than 120 2. Start metformin  500 mg daily with food  - Referred to dietitian for dietary management. - Provided educational materials on dietary modifications, focusing on portion control and protein intake.   After breastfeeding, ideally switch patient to weekly GLP-1 if blood sugars permit  Follow up with PCP next week  ER precautions advised -- handouts given on snacks/protein/low and high blood sugar symptom(s)    Lucie Buttner, PA-C

## 2023-10-02 NOTE — Patient Instructions (Addendum)
 It was great to see you!  Check blood sugar after sleeping, if your fasting blood sugar is > 120, please administer 10 units of insulin  Continue this pattern daily If your blood sugar is consistently elevated for 3 days in a row, increase insulin  by 2 units  Continue pattern and increase by 2 units every 3 days until you have fasting blood sugars less than 120  Start metformin  500 mg daily with food  We will be in touch with blood work and urine results  If worsening vision, headache(s), nausea and vomiting, etc -- GO TO THE ER  Follow up with Alyssa next week  Take care,  Elaiza Shoberg PA-C

## 2023-10-03 ENCOUNTER — Encounter: Attending: Physician Assistant | Admitting: Dietician

## 2023-10-03 ENCOUNTER — Encounter: Payer: Self-pay | Admitting: Dietician

## 2023-10-03 ENCOUNTER — Ambulatory Visit: Payer: Self-pay | Admitting: Physician Assistant

## 2023-10-03 VITALS — Wt 173.4 lb

## 2023-10-03 DIAGNOSIS — E1165 Type 2 diabetes mellitus with hyperglycemia: Secondary | ICD-10-CM | POA: Insufficient documentation

## 2023-10-03 LAB — C-PEPTIDE: C-Peptide: 2.3 ng/mL (ref 0.80–3.85)

## 2023-10-03 NOTE — Progress Notes (Signed)
 Diabetes Self-Management Education  Visit Type: First/Initial  Appt. Start Time: 1015 Appt. End Time: 1115  10/03/2023  Terri Ramirez, identified by name and date of birth, is a 28 y.o. female with a diagnosis of Diabetes: Type 2.   ASSESSMENT  History includes: anxiety, type 2 diabetes Labs noted: 10/01/23: A1c 10.3 Medications include: lantus , metformin  Supplements: prenatal CGM: Freestyle Libre Plus  Pt reports she just found out about diabetes diagnosis. Pt states she had GDM during her pregnancy (pt present at appointment with baby, Terri Ramirez, who is 65 months old). Pt states she did not get checked after 6 weeks, and just got checked the other day and A1c was 10.3%. Pt states she has been extra tired, thirsty, and hungry. Pt states she also noticed blurry vision. Pt reports she had CGM placed. Pt states she plans to pick up insulin  and metformin  from the pharmacy today.   Pt reports she works 12 hour shifts at night, 6pm-7am. Pt reports she either works 5 days or 2 days. Pt states during the night she either gets wendys, or eats snacks at work (had 3 cupcakes last night), or doesn't eat. Pt reports she is a single mom, cooks on her days off. Pt reports often during the day she has oatmeal and a boiled egg, and otherwise snacks until dinner around 10pm. Pt reports snacks typically include oreos, crackers, granola bars, or a piece of fruit. Pt reports she is currently breastfeeding.   Pt states they walk the dog 3 times per day.   currently breastfeeding. There is no height or weight on file to calculate BMI.   Diabetes Self-Management Education - 10/03/23 1015       Visit Information   Visit Type First/Initial      Initial Visit   Diabetes Type Type 2    Date Diagnosed 09/2023    Are you currently following a meal plan? No    Are you taking your medications as prescribed? Yes      Health Coping   How would you rate your overall health? Fair      Psychosocial  Assessment   Patient Belief/Attitude about Diabetes Motivated to manage diabetes    What is the hardest part about your diabetes right now, causing you the most concern, or is the most worrisome to you about your diabetes?   Making healty food and beverage choices    Self-care barriers None    Self-management support Doctor's office    Other persons present Patient    Patient Concerns Nutrition/Meal planning    Special Needs None    Preferred Learning Style No preference indicated    Learning Readiness Ready    How often do you need to have someone help you when you read instructions, pamphlets, or other written materials from your doctor or pharmacy? 1 - Never    What is the last grade level you completed in school? some college      Pre-Education Assessment   Patient understands the diabetes disease and treatment process. Needs Instruction    Patient understands incorporating nutritional management into lifestyle. Needs Instruction    Patient undertands incorporating physical activity into lifestyle. Needs Instruction    Patient understands using medications safely. Needs Instruction    Patient understands monitoring blood glucose, interpreting and using results Needs Instruction    Patient understands prevention, detection, and treatment of acute complications. Needs Instruction    Patient understands prevention, detection, and treatment of chronic complications. Needs Instruction  Patient understands how to develop strategies to address psychosocial issues. Needs Instruction    Patient understands how to develop strategies to promote health/change behavior. Needs Instruction      Complications   Last HgB A1C per patient/outside source 10.3 %    How often do you check your blood sugar? > 4 times/day    Fasting Blood glucose range (mg/dL) >799    Postprandial Blood glucose range (mg/dL) >799    Have you had a dilated eye exam in the past 12 months? No    Have you had a dental exam  in the past 12 months? No    Are you checking your feet? No      Dietary Intake   Breakfast 3 cupcakes during night shift    Snack (morning) none    Lunch 11am: oatmeal, with flax/ chia, peanut butter, fruit and boiled egg    Snack (afternoon) oreos, cookies, crackers, banana    Dinner 10pm: baked chicken and rice OR spaghetti    Snack (evening) none    Beverage(s) 64-128 oz water, 3 body armor lite, 3x/wk coke zero      Activity / Exercise   Activity / Exercise Type Light (walking / raking leaves)    How many days per week do you exercise? 5    How many minutes per day do you exercise? 30    Total minutes per week of exercise 150      Patient Education   Previous Diabetes Education Yes   GDM class   Disease Pathophysiology Explored patient's options for treatment of their diabetes;Definition of diabetes, type 1 and 2, and the diagnosis of diabetes;Factors that contribute to the development of diabetes    Healthy Eating Role of diet in the treatment of diabetes and the relationship between the three main macronutrients and blood glucose level;Plate Method;Reviewed blood glucose goals for pre and post meals and how to evaluate the patients' food intake on their blood glucose level.;Information on hints to eating out and maintain blood glucose control.;Meal options for control of blood glucose level and chronic complications.;Meal timing in regards to the patients' current diabetes medication.    Being Active Role of exercise on diabetes management, blood pressure control and cardiac health.;Helped patient identify appropriate exercises in relation to his/her diabetes, diabetes complications and other health issue.    Medications Reviewed patients medication for diabetes, action, purpose, timing of dose and side effects.    Monitoring Purpose and frequency of SMBG.;Taught/evaluated CGM (comment)    Acute complications Taught prevention, symptoms, and  treatment of hypoglycemia - the 15  rule.;Discussed and identified patients' prevention, symptoms, and treatment of hyperglycemia.    Chronic complications Relationship between chronic complications and blood glucose control;Identified and discussed with patient  current chronic complications    Diabetes Stress and Support Identified and addressed patients feelings and concerns about diabetes;Worked with patient to identify barriers to care and solutions;Role of stress on diabetes    Lifestyle and Health Coping Lifestyle issues that need to be addressed for better diabetes care      Individualized Goals (developed by patient)   Nutrition General guidelines for healthy choices and portions discussed    Physical Activity Exercise 5-7 days per week;30 minutes per day    Medications take my medication as prescribed    Monitoring  Test my blood glucose as discussed;Consistenly use CGM    Problem Solving Eating Pattern    Reducing Risk examine blood glucose patterns;do foot checks daily;treat hypoglycemia with  15 grams of carbs if blood glucose less than 70mg /dL    Health Coping Ask for help with psychological, social, or emotional issues      Post-Education Assessment   Patient understands the diabetes disease and treatment process. Comprehends key points    Patient understands incorporating nutritional management into lifestyle. Comprehends key points    Patient undertands incorporating physical activity into lifestyle. Comprehends key points    Patient understands using medications safely. Comphrehends key points    Patient understands monitoring blood glucose, interpreting and using results Comprehends key points    Patient understands prevention, detection, and treatment of acute complications. Comprehends key points    Patient understands prevention, detection, and treatment of chronic complications. Comprehends key points    Patient understands how to develop strategies to address psychosocial issues. Comprehends key points     Patient understands how to develop strategies to promote health/change behavior. Comprehends key points      Outcomes   Expected Outcomes Demonstrated interest in learning. Expect positive outcomes    Future DMSE 4-6 wks    Program Status Not Completed          Individualized Plan for Diabetes Self-Management Training:   Learning Objective:  Patient will have a greater understanding of diabetes self-management. Patient education plan is to attend individual and/or group sessions per assessed needs and concerns.   Plan:   Patient Instructions  Goals Established by Patient:   Goal 1: follow the plate method at least once a day. (aim to include 1/2 plate non-starchy vegetables, 1/4 plate protein, and 1/4 plate complex carbs.)  Goal 2: choose a more balanced snack. (include a complex carb and protein.) See list.   Look for sheet pan dinners:  [email or web address]  Expected Outcomes:  Demonstrated interest in learning. Expect positive outcomes  Education material provided: ADA - How to Thrive: A Guide for Your Journey with Diabetes, My Plate, and Snack sheet  If problems or questions, patient to contact team via:  Phone  Future DSME appointment: 4-6 wks

## 2023-10-03 NOTE — Patient Instructions (Signed)
 Goals Established by Patient:   Goal 1: follow the plate method at least once a day. (aim to include 1/2 plate non-starchy vegetables, 1/4 plate protein, and 1/4 plate complex carbs.)  Goal 2: choose a more balanced snack. (include a complex carb and protein.) See list.   Look for sheet pan dinners:  [email or web address]

## 2023-10-06 ENCOUNTER — Ambulatory Visit: Admitting: Physician Assistant

## 2023-10-06 ENCOUNTER — Ambulatory Visit: Payer: Self-pay

## 2023-10-06 NOTE — Telephone Encounter (Signed)
 Please see triage note and call from patient and advise

## 2023-10-06 NOTE — Telephone Encounter (Signed)
 FYI Only or Action Required?: Action required by provider: update on patient condition.  Patient was last seen in primary care on 10/02/2023 by Job Lukes, PA.  Called Nurse Triage reporting Hyperglycemia.  Symptoms began several days ago.  Interventions attempted: Prescription medications: insulin  and metformin .  States she has been on the medication for about 2 full days.   Symptoms are: unchanged.  Triage Disposition: Home Care  Patient/caregiver understands and will follow disposition?: No, wishes to speak with PCP  Copied from CRM #[id number]. Topic: Clinical - Red Word Triage >> Oct 06, 2023  2:14 PM Lauren C wrote: Red Word that prompted transfer to Nurse Triage: Blood sugar above 300s. Newly diabetic and met with Dr. on 9/18 to start new meds for acute appt, but pt has yet to see PCP Allwardt. Reason for Disposition  [1] Blood glucose > 300 mg/dL (83.2 mmol/L) AND [7] uses insulin  (e.g., insulin -dependent, all people with type 1 diabetes)  Answer Assessment - Initial Assessment Questions 1. BLOOD GLUCOSE: What is your blood glucose level?      300s 2. ONSET: When did you check the blood glucose?     Has attached sensor 3. USUAL RANGE: What is your glucose level usually? (e.g., usual fasting morning value, usual evening value)     Currently sugar is 169, 200s 4. KETONES: Do you check for ketones (urine or blood test strips)? If Yes, ask: What does the test show now?      Denies having to do this 5. TYPE 1 or 2:  Do you know what type of diabetes you have?  (e.g., Type 1, Type 2, Gestational; doesn't know)      Type 2 6. INSULIN : Do you take insulin ? What type of insulin (s) do you use? What is the mode of delivery? (syringe, pen; injection or pump)?      Taking insulin  as prescribed, just started 7. DIABETES PILLS: Do you take any pills for your diabetes? If Yes, ask: Have you missed taking any pills recently?     Taking metformin  as prescribed, just  started 8. OTHER SYMPTOMS: Do you have any symptoms? (e.g., fever, frequent urination, difficulty breathing, dizziness, weakness, vomiting)     Frequent urination 9. PREGNANCY: Is there any chance you are pregnant? When was your last menstrual period?     denies  Protocols used: Diabetes - High Blood Sugar-A-AH

## 2023-10-07 NOTE — Telephone Encounter (Signed)
 Noted. New f/up scheduled for 10/10/23.

## 2023-10-08 ENCOUNTER — Encounter: Payer: Self-pay | Admitting: Physician Assistant

## 2023-10-09 DIAGNOSIS — F4312 Post-traumatic stress disorder, chronic: Secondary | ICD-10-CM | POA: Diagnosis not present

## 2023-10-10 ENCOUNTER — Ambulatory Visit (INDEPENDENT_AMBULATORY_CARE_PROVIDER_SITE_OTHER): Admitting: Physician Assistant

## 2023-10-10 VITALS — BP 120/70 | HR 89 | Temp 97.7°F | Wt 173.4 lb

## 2023-10-10 DIAGNOSIS — F419 Anxiety disorder, unspecified: Secondary | ICD-10-CM

## 2023-10-10 DIAGNOSIS — Z794 Long term (current) use of insulin: Secondary | ICD-10-CM

## 2023-10-10 DIAGNOSIS — E119 Type 2 diabetes mellitus without complications: Secondary | ICD-10-CM

## 2023-10-10 NOTE — Progress Notes (Signed)
 Patient ID: Terri Ramirez, female    DOB: [birth year], 28 y.o.   MRN: [id number]   Assessment & Plan:  Type 2 diabetes mellitus with insulin  therapy [facility])  Anxiety     Assessment & Plan Type 2 diabetes mellitus in the postpartum period with history of gestational diabetes New onset of type 2 diabetes mellitus postpartum, six months postpartum and breastfeeding, limiting medication options. Initial A1c was 10.3%, indicating poor glycemic control. Despite dietary changes and insulin  therapy, blood glucose levels remain elevated with some improvement. Insulin  injections cause discomfort due to cold administration. Metformin  use results in gastrointestinal side effects. Condition expected to improve with lifestyle changes and may not be permanent. - Continue Lantus  insulin , adjusting dose by 2 units every 3 days until fasting blood sugars are under 120 mg/dL. - Continue metformin  500 mg once daily. - Advise to let insulin  reach room temperature before injection to reduce discomfort. - Encourage continued dietary modifications and follow-up with nutritionist. - Monitor blood glucose levels with Libre device. - Watch for symptoms of severe hyperglycemia (blood sugar over 400 mg/dL), severe abdominal pain, nausea, vomiting, and dizziness, and present to ER if these occur.  Anxiety disorder Anxiety disorder exacerbated by current stressors including being a single mother and managing a new diabetes diagnosis. - Encourage use of community and social support systems.      Return in about 6 weeks (around 11/21/2023) for recheck/follow-up.    Subjective:    Chief Complaint  Patient presents with   Follow-up    Follow up on elevated blood sugar. Wants to discuss other options for insulin  injections. Flu declined today.    HPI Discussed the use of AI scribe software for clinical note transcription with the patient, who gave verbal consent to proceed.  History of Present  Illness Terri Ramirez is a 28 year old female who presents with new onset type 2 diabetes. She is accompanied by her six-month-old son, Malachi.  She is six months postpartum and was diagnosed with type 2 diabetes following a routine OBGYN appointment where her A1c was found to be 10.3%. She is currently breastfeeding, which limits her medication options. She was started on Lantus  insulin  and metformin  500 mg daily.  She experiences significant challenges in managing her blood sugar levels, with readings often in the 300s despite adherence to dietary changes and medication. Symptoms include frequent hunger, thirst, and excessive urination. She feels shaky and sweaty when her blood sugar drops below 160.  She describes difficulty with insulin  injections, noting significant pain and bleeding with each injection. Her diet includes more vegetables and protein, with reduced starch intake. She continues to eat oatmeal for breakfast, with added peanut butter and flax seeds, and occasionally berries. 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  She feels tired all the time, despite her son not waking up much at night. She drinks water, Body Armor, and occasionally Coke Zero. She is trying to manage her diet and insulin  regimen while balancing the demands of work and motherhood.     Past Medical History:  Diagnosis Date   Anxiety    Gestational diabetes    Low blood pressure    Low sodium levels     No past surgical history on file.  Family History  Adopted: Yes  Family history unknown: Yes    Social History   Tobacco Use   Smoking status: Never   Smokeless tobacco:  Never  Vaping Use   Vaping status: Never Used  Substance Use Topics   Alcohol use: Not Currently   Drug use: Not Currently    Types: Marijuana    Comment: none during preg     No Known Allergies  Review of Systems NEGATIVE UNLESS OTHERWISE  INDICATED IN HPI      Objective:     BP 120/70   Pulse 89   Temp 97.7 F (36.5 C) (Temporal)   Wt 173 lb 6.4 oz (78.7 kg)   SpO2 99%   BMI 30.72 kg/m   Wt Readings from Last 3 Encounters:  10/11/23 173 lb 8 oz (78.7 kg)  10/10/23 173 lb 6.4 oz (78.7 kg)  10/03/23 173 lb 6.4 oz (78.7 kg)    BP Readings from Last 3 Encounters:  10/12/23 (!) 148/90  10/10/23 120/70  10/02/23 110/80     Physical Exam Vitals and nursing note reviewed.  Constitutional:      Appearance: Normal appearance. She is obese.  Eyes:     Extraocular Movements: Extraocular movements intact.     Conjunctiva/sclera: Conjunctivae normal.     Pupils: Pupils are equal, round, and reactive to light.  Cardiovascular:     Rate and Rhythm: Normal rate.     Pulses: Normal pulses.  Pulmonary:     Effort: Pulmonary effort is normal.  Neurological:     General: No focal deficit present.     Mental Status: She is alert.  Psychiatric:        Mood and Affect: Mood normal.             Tyarra Nolton M Jackline Castilla, PA-C

## 2023-10-11 ENCOUNTER — Emergency Department (HOSPITAL_COMMUNITY)
Admission: EM | Admit: 2023-10-11 | Discharge: 2023-10-12 | Disposition: A | Discharge status: Home / Self Care | Attending: Emergency Medicine | Admitting: Emergency Medicine

## 2023-10-11 ENCOUNTER — Encounter (HOSPITAL_COMMUNITY): Payer: Self-pay | Admitting: *Deleted

## 2023-10-11 ENCOUNTER — Other Ambulatory Visit: Payer: Self-pay

## 2023-10-11 DIAGNOSIS — E1065 Type 1 diabetes mellitus with hyperglycemia: Secondary | ICD-10-CM | POA: Insufficient documentation

## 2023-10-11 DIAGNOSIS — Z794 Long term (current) use of insulin: Secondary | ICD-10-CM | POA: Diagnosis not present

## 2023-10-11 DIAGNOSIS — R739 Hyperglycemia, unspecified: Secondary | ICD-10-CM | POA: Diagnosis not present

## 2023-10-11 DIAGNOSIS — Z7984 Long term (current) use of oral hypoglycemic drugs: Secondary | ICD-10-CM | POA: Diagnosis not present

## 2023-10-11 DIAGNOSIS — E1165 Type 2 diabetes mellitus with hyperglycemia: Secondary | ICD-10-CM | POA: Diagnosis not present

## 2023-10-11 LAB — URINALYSIS, ROUTINE W REFLEX MICROSCOPIC
Bilirubin Urine: NEGATIVE
Glucose, UA: >=500 mg/dL — AB
Hgb urine dipstick: NEGATIVE
Ketones, ur: NEGATIVE mg/dL
Leukocytes,Ua: NEGATIVE
Nitrite: NEGATIVE
Protein, ur: NEGATIVE mg/dL
Specific Gravity, Urine: 1.024 (ref 1.005–1.030)
pH: 6 (ref 5.0–8.0)

## 2023-10-11 LAB — CBC
HCT: 40.3 % (ref 36.0–46.0)
Hemoglobin: 13.7 g/dL (ref 12.0–15.0)
MCH: 30.4 pg (ref 26.0–34.0)
MCHC: 34 g/dL (ref 30.0–36.0)
MCV: 89.6 fL (ref 80.0–100.0)
Platelets: 249 K/uL (ref 150–400)
RBC: 4.5 MIL/uL (ref 3.87–5.11)
RDW: 12.1 % (ref 11.5–15.5)
WBC: 8.6 K/uL (ref 4.0–10.5)
nRBC: 0 % (ref 0.0–0.2)

## 2023-10-11 LAB — COMPREHENSIVE METABOLIC PANEL WITH GFR
ALT: 22 U/L (ref 0–44)
AST: 19 U/L (ref 15–41)
Albumin: 3.6 g/dL (ref 3.5–5.0)
Alkaline Phosphatase: 116 U/L (ref 38–126)
Anion gap: 11 (ref 5–15)
BUN: 11 mg/dL (ref 6–20)
CO2: 23 mmol/L (ref 22–32)
Calcium: 9.7 mg/dL (ref 8.9–10.3)
Chloride: 101 mmol/L (ref 98–111)
Creatinine, Ser: 0.57 mg/dL (ref 0.44–1.00)
GFR, Estimated: >60 mL/min (ref >60)
Glucose, Bld: 361 mg/dL — ABNORMAL HIGH (ref 70–99)
Potassium: 3.9 mmol/L (ref 3.5–5.1)
Sodium: 135 mmol/L (ref 135–145)
Total Bilirubin: 0.5 mg/dL (ref 0.0–1.2)
Total Protein: 7.1 g/dL (ref 6.5–8.1)

## 2023-10-11 LAB — CBG MONITORING, ED: Glucose-Capillary: 379 mg/dL — ABNORMAL HIGH (ref 70–99)

## 2023-10-11 LAB — HCG, SERUM, QUALITATIVE: Preg, Serum: NEGATIVE

## 2023-10-11 MED ORDER — ONDANSETRON HCL 4 MG/2ML IJ SOLN
4.0000 mg | Freq: Once | INTRAMUSCULAR | Status: AC
  Administered 2023-10-11: 4 mg via INTRAVENOUS
  Filled 2023-10-11: qty 2

## 2023-10-11 MED ORDER — SODIUM CHLORIDE 0.9 % IV BOLUS
1000.0000 mL | Freq: Once | INTRAVENOUS | Status: AC
Start: 2023-10-11 — End: 2023-10-12
  Administered 2023-10-11: 1000 mL via INTRAVENOUS

## 2023-10-11 MED ORDER — INSULIN ASPART 100 UNIT/ML IJ SOLN
10.0000 [IU] | Freq: Once | INTRAMUSCULAR | Status: AC
  Administered 2023-10-11: 10 [IU] via SUBCUTANEOUS

## 2023-10-11 NOTE — ED Triage Notes (Signed)
 The pt was just diagnosed with diabetes in the past 2 weeks  earlier today she vomited at work her blood sugar was reading high  she is on insulin  and metformin   lmpnone  she is breast feeding

## 2023-10-11 NOTE — ED Provider Notes (Addendum)
 [place] EMERGENCY DEPARTMENT AT [facility] Provider Note   CSN: [id number] Arrival date & time: 10/11/23  2118     Patient presents with: Hyperglycemia   Terri Ramirez is a 28 y.o. female with history of anxiety, low blood pressure, low sodium levels, gestational diabetes.  Patient presents to ED for evaluation of hyperglycemia.  States that she was diagnosed with diabetes 1 week ago.  Reports that she had elevated blood sugars readings throughout her pregnancy but states they have persisted and recently had hemoglobin A1c taken which was 10.3.  She states that she has began taking metformin  500 mg once a day as well as 14 units of insulin  once a day.  She states that she checked her blood sugar earlier today and noticed that it was unreadable and she came to the ED for evaluation.  She reports that she is constantly hungry, thirsty and urinating often.  She states that she is having diarrhea and nausea secondary to her metformin  usage.  She reports that she threw up 1 time earlier today.  She denies blood in stool, fevers, chest pain, shortness of breath.  Denies vaginal bleeding or abdominal cramping.    Hyperglycemia Associated symptoms: increased thirst, nausea and polyuria   Associated symptoms: no abdominal pain        Prior to Admission medications   Medication Sig Start Date End Date Taking? Authorizing Provider  acetaminophen  (TYLENOL ) 500 MG tablet Take 500 mg by mouth every 6 (six) hours as needed.    [provider]  escitalopram  (LEXAPRO ) 20 MG tablet Take 1 tablet (20 mg total) by mouth daily. 07/17/23   Allwardt, Alyssa M, PA-C  ibuprofen  (ADVIL ) 600 MG tablet Take 1 tablet (600 mg total) by mouth every 6 (six) hours as needed. 03/17/23   Henry Slough, MD  insulin  glargine (LANTUS ) 100 UNIT/ML Solostar Pen Take 10 units, increase by 2 units every 3 days until fasting blood sugars are under 120 units. 10/02/23   Job Lukes, PA  metFORMIN   (GLUCOPHAGE ) 500 MG tablet Take 1 tablet (500 mg total) by mouth daily with breakfast. 10/02/23   Job Lukes, PA  Prenatal Vit-Fe Fumarate-FA (PRENATAL MULTIVITAMIN) TABS tablet Take 1 tablet by mouth daily at 12 noon.    [provider]  senna-docusate (SENOKOT-S) 8.6-50 MG tablet Take 2 tablets by mouth daily as needed for mild constipation. Patient not taking: Reported on 10/10/2023 04/10/23   Allwardt, Alyssa M, PA-C    Allergies: Patient has no known allergies.    Review of Systems  Gastrointestinal:  Positive for diarrhea and nausea. Negative for abdominal pain.  Endocrine: Positive for polydipsia, polyphagia and polyuria.  All other systems reviewed and are negative.   Updated Vital Signs BP (!) 135/92 (BP Location: Right Arm)   Pulse 88   Temp 98.3 F (36.8 C)   Resp (!) 22   Ht 5' 3 (1.6 m)   Wt 78.7 kg   SpO2 99%   Breastfeeding Yes   BMI 30.73 kg/m   Physical Exam Vitals and nursing note reviewed.  Constitutional:      General: She is not in acute distress.    Appearance: She is well-developed.  HENT:     Head: Normocephalic and atraumatic.  Eyes:     Conjunctiva/sclera: Conjunctivae normal.  Cardiovascular:     Rate and Rhythm: Normal rate and regular rhythm.     Heart sounds: No murmur heard. Pulmonary:     Effort: Pulmonary effort is normal.  No respiratory distress.     Breath sounds: Normal breath sounds.  Abdominal:     Palpations: Abdomen is soft.     Tenderness: There is no abdominal tenderness.  Musculoskeletal:        General: No swelling.     Cervical back: Neck supple.  Skin:    General: Skin is warm and dry.     Capillary Refill: Capillary refill takes less than 2 seconds.  Neurological:     Mental Status: She is alert and oriented to person, place, and time. Mental status is at baseline.  Psychiatric:        Mood and Affect: Mood normal.     (all labs ordered are listed, but only abnormal results are displayed) Labs  Reviewed  COMPREHENSIVE METABOLIC PANEL WITH GFR - Abnormal; Notable for the following components:      Result Value   Glucose, Bld 361 (*)    All other components within normal limits  URINALYSIS, ROUTINE W REFLEX MICROSCOPIC - Abnormal; Notable for the following components:   Color, Urine STRAW (*)    Glucose, UA >=500 (*)    Bacteria, UA RARE (*)    All other components within normal limits  CBG MONITORING, ED - Abnormal; Notable for the following components:   Glucose-Capillary 379 (*)    All other components within normal limits  LIPASE, BLOOD  CBC  HCG, SERUM, QUALITATIVE  CBG MONITORING, ED  CBG MONITORING, ED    EKG: None  Radiology: No results found.  Procedures   Medications Ordered in the ED  insulin  aspart (novoLOG ) injection 10 Units (10 Units Subcutaneous Given 10/11/23 2313)  sodium chloride  0.9 % bolus 1,000 mL (1,000 mLs Intravenous New Bag/Given 10/11/23 2311)  ondansetron  (ZOFRAN ) injection 4 mg (4 mg Intravenous Given 10/11/23 2312)       Medical Decision Making Amount and/or Complexity of Data Reviewed Labs: ordered.  Risk Prescription drug management.   This is a 28 year old female recently diagnosed with gestational diabetes who presents with elevated blood sugar reading at home.  On exam, she is afebrile and nontachycardic.  Her lung sounds are clear bilaterally, she is not hypoxic.  Her abdomen is soft and compressible.  Neurological examination at baseline.  Overall nontoxic in appearance.  Hemodynamically stable.  Labs initiated in triage include CBC, CMP, lipase, urinalysis, CBG, hCG.  Labs show a CBC without leukocytosis or anemia.  Metabolic panel with a glucose 638 without electrolyte derangement, no elevated LFT, anion gap 11.  Urinalysis without ketones, does show glucose and rare bacteria but patient denies any dysuria and there are no nitrites or leukocytes.  Lipase 25.  Point-of-care CBG initially 379.  The patient was given 10 units of  insulin , 1 L of fluid and her recheck of CBG was 91.  At this time patient is stable to discharge home.  No evidence of DKA.  No anion gap elevation or ketones in urine.  Patient will be advised to follow-up with PCP.  Encouraged to continue monitoring blood sugar at home and continuing to take medications as prescribed.  She was given return precautions and she voiced understanding.  She is stable to discharge at this time.  Update: Prior to discharge patient continuous blood glucose monitoring showed CBG of 57.  Patient was given graham crackers, peanut butter, 2 apple juices and [place] sandwich.  She was observed and her CBG recheck showed 167. She is stable to discharge home at this time.     Final diagnoses:  Hyperglycemia    ED Discharge Orders     None          Elgene Coral F, PA-C 10/12/23 0045    Ruthell Lonni FALCON, PA-C 10/12/23 [place] Midge Golas, MD 10/12/23 [phone]

## 2023-10-11 NOTE — ED Provider Notes (Incomplete)
 [place] EMERGENCY DEPARTMENT AT [facility] Provider Note   CSN: [id number] Arrival date & time: 10/11/23  2118     Patient presents with: Hyperglycemia   Terri Ramirez is a 28 y.o. female with history of anxiety, low blood pressure, low sodium levels, gestational diabetes.  Patient presents to ED for evaluation of hyperglycemia.  States that she was diagnosed with diabetes 1 week ago.  Reports that she had elevated blood sugars readings throughout her pregnancy but states they have persisted and recently had hemoglobin A1c taken which was 10.3.  She states that she has began taking metformin  500 mg once a day as well as 14 units of insulin  once a day.  She states that she checked her blood sugar earlier today and noticed that it was unreadable and she came to the ED for evaluation.  She reports that she is constantly hungry, thirsty and urinating often.  She states that she is having diarrhea and nausea secondary to her metformin  usage.  She reports that she threw up 1 time earlier today.  She denies blood in stool, fevers, chest pain, shortness of breath.  Denies vaginal bleeding or abdominal cramping.    Hyperglycemia      Prior to Admission medications   Medication Sig Start Date End Date Taking? Authorizing Provider  acetaminophen  (TYLENOL ) 500 MG tablet Take 500 mg by mouth every 6 (six) hours as needed.    [provider]  escitalopram  (LEXAPRO ) 20 MG tablet Take 1 tablet (20 mg total) by mouth daily. 07/17/23   Allwardt, Alyssa M, PA-C  ibuprofen  (ADVIL ) 600 MG tablet Take 1 tablet (600 mg total) by mouth every 6 (six) hours as needed. 03/17/23   Henry Slough, MD  insulin  glargine (LANTUS ) 100 UNIT/ML Solostar Pen Take 10 units, increase by 2 units every 3 days until fasting blood sugars are under 120 units. 10/02/23   Job Lukes, PA  metFORMIN  (GLUCOPHAGE ) 500 MG tablet Take 1 tablet (500 mg total) by mouth daily with breakfast. 10/02/23   Job Lukes, PA  Prenatal Vit-Fe Fumarate-FA (PRENATAL MULTIVITAMIN) TABS tablet Take 1 tablet by mouth daily at 12 noon.    [provider]  senna-docusate (SENOKOT-S) 8.6-50 MG tablet Take 2 tablets by mouth daily as needed for mild constipation. Patient not taking: Reported on 10/10/2023 04/10/23   Allwardt, Alyssa M, PA-C    Allergies: Patient has no known allergies.    Review of Systems  Updated Vital Signs BP (!) 135/92 (BP Location: Right Arm)   Pulse 88   Temp 98.3 F (36.8 C)   Resp (!) 22   Ht 5' 3 (1.6 m)   Wt 78.7 kg   SpO2 99%   Breastfeeding Yes   BMI 30.73 kg/m   Physical Exam  (all labs ordered are listed, but only abnormal results are displayed) Labs Reviewed  COMPREHENSIVE METABOLIC PANEL WITH GFR - Abnormal; Notable for the following components:      Result Value   Glucose, Bld 361 (*)    All other components within normal limits  URINALYSIS, ROUTINE W REFLEX MICROSCOPIC - Abnormal; Notable for the following components:   Color, Urine STRAW (*)    Glucose, UA >=500 (*)    Bacteria, UA RARE (*)    All other components within normal limits  CBG MONITORING, ED - Abnormal; Notable for the following components:   Glucose-Capillary 379 (*)    All other components within normal limits  CBC  LIPASE, BLOOD  HCG,  SERUM, QUALITATIVE  CBG MONITORING, ED    EKG: None  Radiology: No results found.  {Document cardiac monitor, telemetry assessment procedure when appropriate:32947} Procedures   Medications Ordered in the ED  insulin  aspart (novoLOG ) injection 10 Units (has no administration in time range)  sodium chloride  0.9 % bolus 1,000 mL (has no administration in time range)  ondansetron  (ZOFRAN ) injection 4 mg (has no administration in time range)      {Click here for ABCD2, HEART and other calculators REFRESH Note before signing:1}                              Medical Decision Making Amount and/or Complexity of Data Reviewed Labs:  ordered.  Risk Prescription drug management.   ***  {Document critical care time when appropriate  Document review of labs and clinical decision tools ie CHADS2VASC2, etc  Document your independent review of radiology images and any outside records  Document your discussion with family members, caretakers and with consultants  Document social determinants of health affecting pt's care  Document your decision making why or why not admission, treatments were needed:32947:::1}   Final diagnoses:  None    ED Discharge Orders     None

## 2023-10-12 DIAGNOSIS — E1065 Type 1 diabetes mellitus with hyperglycemia: Secondary | ICD-10-CM | POA: Diagnosis not present

## 2023-10-12 LAB — CBG MONITORING, ED
Glucose-Capillary: 167 mg/dL — ABNORMAL HIGH (ref 70–99)
Glucose-Capillary: 61 mg/dL — ABNORMAL LOW (ref 70–99)
Glucose-Capillary: 91 mg/dL (ref 70–99)

## 2023-10-12 LAB — LIPASE, BLOOD: Lipase: 25 U/L (ref 11–51)

## 2023-10-12 NOTE — ED Notes (Signed)
 Patient given sandwich bag and orange juice.  Will discharge when CBG is above 90 per PA.

## 2023-10-12 NOTE — Discharge Instructions (Signed)
 It was a pleasure taking part in your care.  As we discussed, no evidence of DKA here tonight.  Will give you insulin , fluid in your blood sugar decreased to 91.  Please continue taking metformin , insulin  at home as prescribed.  Please continue to monitor your blood sugar.  Please follow-up with your PCP for reevaluation.  If you continue to have high blood sugar readings at home, please return to the ED.

## 2023-10-13 ENCOUNTER — Telehealth: Payer: Self-pay | Admitting: Physician Assistant

## 2023-10-13 NOTE — Telephone Encounter (Signed)
 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 Type II diabetes was dx recently. Took insulin  this morning and also metformin  only had 2 boiled eggs and another 2 eggs and out of nowhere it spiked and she vomited-her chest feels heavy. Translation No Nurse Assessment Nurse: Nanda, RN, Nena Date/Time (Eastern Time): 10/11/2023 8:27:28 PM Confirm and document reason for call. If symptomatic, describe symptoms. ---Caller states she was at work and just left her blood sugar is reading 400 right now. Type II diabetes was dx recently. Took insulin  this morning and also metformin  only had 2 boiled eggs and another 2 eggs and out of nowhere it spiked and she vomited-her chest feels heavy. Her last A1C was 10.3 she had baby 6 months ago the OB found the elevated A1C. Does the patient have any new or worsening symptoms? ---Yes Will a triage be completed? ---Yes Related visit to physician within the last 2 weeks? ---Yes Does the PT have any chronic conditions? (i.e. diabetes, asthma, this includes High risk factors for pregnancy, etc.) ---Yes List chronic conditions. ---diabetes Is the patient pregnant or possibly pregnant? (Ask all females between the ages of 49-55) ---No Is this a behavioral  health or substance abuse call? ---No Guidelines Guideline Title Affirmed Question Affirmed Notes Nurse Date/Time (Eastern Time) Diabetes - High Blood Sugar [1] Vomiting AND [2] signs of dehydration (e.g., very dry mouth, lightheaded, dark urine) Plummer, RN, Nena 10/11/2023 8:30:13 PM Disp. Time Titus Time) Disposition Final User 10/11/2023 8:25:45 PM Send to Urgent Queue Freddi Shuck 10/11/2023 8:32:26 PM Go to ED Now Yes Nanda, RN, Nena Final Disposition 10/11/2023 8:32:26 PM Go to ED Now Yes Nanda, RN, Nena [place] Disagree/Comply Comply Caller Understands Yes PreDisposition Did not know what to do Care Advice Given Per Guideline GO TO ED NOW: * You need to be seen in the Emergency Department. NOTE TO TRIAGER - DRIVING: * Another adult should drive. CARE ADVICE given per Diabetes - High Blood Sugar (Adult) guideline. Comments User: Nena Nanda, RN Date/Time Titus Time): 10/11/2023 8:33:16 PM she has to bring her 48 month old baby and she is asking that they be sat to the sit somewhere so baby will have reduced exposure. Referrals [facility] - ED

## 2023-10-13 NOTE — Telephone Encounter (Signed)
 Noted

## 2023-10-13 NOTE — Telephone Encounter (Signed)
 Please see note from [facility] regarding pt and recent ED visit, please advise

## 2023-10-13 NOTE — Patient Instructions (Signed)
 Continue to work with nutritionist   Continue to titrate insulin   May increase Metformin  to twice daily in a few weeks if still tolerating well

## 2023-10-15 ENCOUNTER — Telehealth: Payer: Self-pay

## 2023-10-15 NOTE — Telephone Encounter (Signed)
 Transition Care Management Unsuccessful Follow-up Telephone Call  Date of discharge and from where:  11/11/23 [facility]  Attempts:  1st Attempt  Reason for unsuccessful TCM follow-up call:  Left voice message for patient attempting to complete [facility] call regarding recent ED visit. Advised to return call and schedule ED follow up with PCP. Please schedule patient for ED follow up as instructed in discharge.

## 2023-10-19 ENCOUNTER — Encounter (HOSPITAL_COMMUNITY): Payer: Self-pay | Admitting: *Deleted

## 2023-10-19 ENCOUNTER — Emergency Department (HOSPITAL_COMMUNITY)
Admission: EM | Admit: 2023-10-19 | Discharge: 2023-10-20 | Disposition: A | Attending: Emergency Medicine | Admitting: Emergency Medicine

## 2023-10-19 ENCOUNTER — Other Ambulatory Visit: Payer: Self-pay

## 2023-10-19 DIAGNOSIS — Z008 Encounter for other general examination: Secondary | ICD-10-CM | POA: Diagnosis not present

## 2023-10-19 DIAGNOSIS — Z5321 Procedure and treatment not carried out due to patient leaving prior to being seen by health care provider: Secondary | ICD-10-CM | POA: Diagnosis not present

## 2023-10-19 LAB — COMPREHENSIVE METABOLIC PANEL WITH GFR
ALT: 23 U/L (ref 0–44)
AST: 19 U/L (ref 15–41)
Albumin: 3.7 g/dL (ref 3.5–5.0)
Alkaline Phosphatase: 113 U/L (ref 38–126)
Anion gap: 15 (ref 5–15)
BUN: 10 mg/dL (ref 6–20)
CO2: 22 mmol/L (ref 22–32)
Calcium: 9.2 mg/dL (ref 8.9–10.3)
Chloride: 97 mmol/L — ABNORMAL LOW (ref 98–111)
Creatinine, Ser: 0.46 mg/dL (ref 0.44–1.00)
GFR, Estimated: >60 mL/min (ref >60)
Glucose, Bld: 269 mg/dL — ABNORMAL HIGH (ref 70–99)
Potassium: 3.9 mmol/L (ref 3.5–5.1)
Sodium: 134 mmol/L — ABNORMAL LOW (ref 135–145)
Total Bilirubin: 0.7 mg/dL (ref 0.0–1.2)
Total Protein: 7.2 g/dL (ref 6.5–8.1)

## 2023-10-19 LAB — CBC
HCT: 39.2 % (ref 36.0–46.0)
Hemoglobin: 13.3 g/dL (ref 12.0–15.0)
MCH: 30.5 pg (ref 26.0–34.0)
MCHC: 33.9 g/dL (ref 30.0–36.0)
MCV: 89.9 fL (ref 80.0–100.0)
Platelets: 265 K/uL (ref 150–400)
RBC: 4.36 MIL/uL (ref 3.87–5.11)
RDW: 11.9 % (ref 11.5–15.5)
WBC: 9 K/uL (ref 4.0–10.5)
nRBC: 0 % (ref 0.0–0.2)

## 2023-10-19 LAB — HCG, SERUM, QUALITATIVE: Preg, Serum: NEGATIVE

## 2023-10-19 LAB — CBG MONITORING, ED: Glucose-Capillary: 263 mg/dL — ABNORMAL HIGH (ref 70–99)

## 2023-10-19 NOTE — ED Notes (Signed)
 Pt leaving due to wait time and child care.

## 2023-10-19 NOTE — ED Triage Notes (Signed)
 The pt has a diabetic sensor that she cannot get on and she has not checked her glucose for 3 days  she has also not taken her insulin     lmp one year she is breast feeding

## 2023-10-23 ENCOUNTER — Telehealth: Payer: Self-pay

## 2023-10-23 DIAGNOSIS — F4312 Post-traumatic stress disorder, chronic: Secondary | ICD-10-CM | POA: Diagnosis not present

## 2023-10-23 NOTE — Telephone Encounter (Signed)
 Called pt due to 2 recent visits to ED for hyperglycemia. Pt newly diagnosed with Diabetes. Advised via vm to return my call to get scheduled to see PCP next week to discuss Diabetes and help getting glucose under control. Please transfer pt directly to me if she returns the call or schedule pt next week to see Alyssa.

## 2023-10-27 ENCOUNTER — Ambulatory Visit: Admitting: Physician Assistant

## 2023-10-27 VITALS — BP 138/84 | HR 63 | Temp 98.5°F | Ht 63.0 in | Wt 174.0 lb

## 2023-10-27 DIAGNOSIS — E119 Type 2 diabetes mellitus without complications: Secondary | ICD-10-CM | POA: Diagnosis not present

## 2023-10-27 DIAGNOSIS — F439 Reaction to severe stress, unspecified: Secondary | ICD-10-CM

## 2023-10-27 DIAGNOSIS — K521 Toxic gastroenteritis and colitis: Secondary | ICD-10-CM | POA: Diagnosis not present

## 2023-10-27 DIAGNOSIS — Z794 Long term (current) use of insulin: Secondary | ICD-10-CM

## 2023-10-27 DIAGNOSIS — F4312 Post-traumatic stress disorder, chronic: Secondary | ICD-10-CM | POA: Diagnosis not present

## 2023-10-27 NOTE — Progress Notes (Signed)
 Patient ID: Terri Ramirez, female    DOB: 04/08/1995, 28 y.o.   MRN: [id number]   Assessment & Plan:  Type 2 diabetes mellitus with insulin  therapy (HCC)  Diarrhea due to drug  Stress at home    Assessment & Plan Type 2 diabetes mellitus with hyperglycemia Type 2 diabetes mellitus with hyperglycemia, currently managed with insulin  therapy. Blood glucose levels are high, with recent averages in the high 100s to low 200s. A1c is approximately 8.7%, indicating suboptimal control. Metformin  was previously used but caused significant gastrointestinal side effects, including severe diarrhea. - Continue insulin  therapy, increasing by 2-3 units every 2-3 days until fasting glucose is closer to 130 mg/dL. - Discontinue metformin  due to adverse gastrointestinal effects. - Provide Freestyle Libre sensors for continuous glucose monitoring. - Consider splitting insulin  doses if total daily dose becomes high or if injection site pain occurs. - Explore the use of lidocaine  gel to reduce injection site pain.  Diarrhea due to metformin  Diarrhea secondary to metformin  use, causing significant gastrointestinal distress and impacting quality of life. - Discontinue metformin  to alleviate gastrointestinal symptoms.  Psychosocial stressors related to childcare and family situation Significant psychosocial stressors related to childcare and interactions with the child's father, including concerns about caregiving practices and financial support. Stress is compounded by work schedule and lack of consistent childcare. - Acknowledge and validate the stress related to childcare and family dynamics. - Consider discussing these stressors with a therapist for further support.      Return in about 10 weeks (around 01/05/2024) for recheck/follow-up.    Subjective:    Chief Complaint  Patient presents with   Hospitalization Follow-up    Patient states no questions or concerns to discuss. Patient  states the last two days she has been seeing stars, states she felt like she was about to fall.     HPI Discussed the use of AI scribe software for clinical note transcription with the patient, who gave verbal consent to proceed.  History of Present Illness Terri Ramirez is a 28 year old female with type 2 diabetes who presents for follow-up of hyperglycemia.  She is on insulin  therapy and notes her blood sugar levels have been high, with a current reading of 163 mg/dL despite not eating since 2 AM. Her A1c was previously high at 9% over the last 14 days, with recent averages in the high 100s to low 200s. She is taking 20 units of insulin , but her fasting glucose remains above 100 mg/dL, often in the 799d or high 100s.  She has experienced significant gastrointestinal side effects from metformin , including severe diarrhea, leading her to discontinue its use. When taking metformin  with insulin , her blood sugar drops quickly, causing shakiness and hunger when glucose levels fall into the 80s or 90s.  She has been adjusting her diet, focusing on protein, vegetables, and brown rice with quinoa, and has reduced her oatmeal servings. She avoids sweets, although she occasionally craves them.  She experienced issues with her glucose sensor, resulting in an ER visit when her blood sugar was 262 mg/dL.  She is experiencing stress related to childcare and her relationship with her child's father, who is not providing adequate care or financial support. She is seeking alternative childcare arrangements to avoid relying on him. She works night shifts and is seeking a day job to better manage childcare.      Past Medical History:  Diagnosis Date   Anxiety    Gestational diabetes    Low  blood pressure    Low sodium levels     No past surgical history on file.  Family History  Adopted: Yes  Family history unknown: Yes    Social History   Tobacco Use   Smoking status: Never   Smokeless  tobacco: Never  Vaping Use   Vaping status: Never Used  Substance Use Topics   Alcohol use: Not Currently   Drug use: Not Currently    Types: Marijuana    Comment: none during preg     No Known Allergies  Review of Systems NEGATIVE UNLESS OTHERWISE INDICATED IN HPI      Objective:     BP 138/84   Pulse 63   Temp 98.5 F (36.9 C) (Oral)   Ht 5' 3 (1.6 m)   Wt 174 lb (78.9 kg)   SpO2 97%   BMI 30.82 kg/m   Wt Readings from Last 3 Encounters:  10/27/23 174 lb (78.9 kg)  10/19/23 173 lb 8 oz (78.7 kg)  10/11/23 173 lb 8 oz (78.7 kg)    BP Readings from Last 3 Encounters:  10/27/23 138/84  10/19/23 (!) 125/95  10/12/23 (!) 148/90     Physical Exam Vitals and nursing note reviewed.  Constitutional:      Appearance: Normal appearance. She is obese.  Eyes:     Extraocular Movements: Extraocular movements intact.     Conjunctiva/sclera: Conjunctivae normal.     Pupils: Pupils are equal, round, and reactive to light.  Cardiovascular:     Rate and Rhythm: Normal rate and regular rhythm.     Pulses: Normal pulses.     Heart sounds: Normal heart sounds.  Neurological:     General: No focal deficit present.     Mental Status: She is alert and oriented to person, place, and time.  Psychiatric:        Mood and Affect: Mood normal.        Behavior: Behavior normal.             Riot Barrick M Brennan Karam, PA-C

## 2023-10-28 NOTE — Patient Instructions (Signed)
  VISIT SUMMARY: Today, we discussed your high blood sugar levels and the challenges you are facing with your diabetes management. We also addressed the gastrointestinal side effects you experienced from metformin  and the stress related to your childcare and family situation.  YOUR PLAN: TYPE 2 DIABETES MELLITUS WITH HYPERGLYCEMIA: Your blood sugar levels have been high, and your A1c is around 8.7%, indicating that your diabetes is not well controlled. -Continue your insulin  therapy and increase the dose by 2-3 units every 2-3 days until your fasting glucose is closer to 130 mg/dL. -Stop taking metformin  due to the severe diarrhea it caused. -We will provide you with Freestyle Libre sensors for continuous glucose monitoring. -If your total daily insulin  dose becomes high or if you experience pain at the injection site, consider splitting the doses. -You can use lidocaine  gel to reduce pain at the injection site if needed.  DIARRHEA DUE TO METFORMIN : You experienced severe diarrhea from metformin , which affected your quality of life. -Stop taking metformin  to help alleviate the gastrointestinal symptoms.  PSYCHOSOCIAL STRESSORS RELATED TO CHILDCARE AND FAMILY SITUATION: You are experiencing significant stress related to childcare and your relationship with your child's father. -Acknowledge and validate the stress you are experiencing. -Consider discussing these stressors with a therapist for additional support.                      Contains text generated by [employer]bridge.                                 Contains text generated by [employer]bridge.

## 2023-11-05 DIAGNOSIS — F4312 Post-traumatic stress disorder, chronic: Secondary | ICD-10-CM | POA: Diagnosis not present

## 2023-11-11 ENCOUNTER — Telehealth: Admitting: Dietician

## 2023-11-14 ENCOUNTER — Encounter: Payer: Self-pay | Admitting: Dietician

## 2023-11-14 ENCOUNTER — Encounter: Attending: Physician Assistant | Admitting: Dietician

## 2023-11-14 DIAGNOSIS — E1165 Type 2 diabetes mellitus with hyperglycemia: Secondary | ICD-10-CM | POA: Insufficient documentation

## 2023-11-14 DIAGNOSIS — Z794 Long term (current) use of insulin: Secondary | ICD-10-CM | POA: Diagnosis not present

## 2023-11-14 NOTE — Patient Instructions (Signed)
 New Goal:  Goal 1: If craving a sweet, go to a bakery and get 1 instead of buying a box to bring home.   Assessment of Previous Goals Established by Patient:    Goal 1: follow the plate method at least once a day. (aim to include 1/2 plate non-starchy vegetables, 1/4 plate protein, and 1/4 plate complex carbs.) - goal met, continue!   Goal 2: choose a more balanced snack. (include a complex carb and protein.) See list. - goal not met, continue.

## 2023-11-14 NOTE — Progress Notes (Signed)
 Diabetes Self-Management Education  Visit Type: Follow-up  Appt. Start Time: 1000 Appt. End Time: 1034  11/14/2023  [name], identified by name and date of birth, is a 28 y.o. female with a diagnosis of Diabetes: Type 2.   ASSESSMENT  History includes: anxiety, type 2 diabetes Labs noted: 10/01/23: A1c 10.3 Medications include: lantus , metformin  Supplements: prenatal CGM: Freestyle Libre Plus   Pt works nights 12 hours 6pm-7am either 5 days or 2 days per week.   Pt reports she has been meal prepping more, typically chicken or salmon with vegetables and a starch. Pt states she has been eating one of these meals most days, and states if she doesn't meal prep she gets zaxbys.   Pt states her fasting blood glucose has been around 165mg /dL. Pt reports her GMI for last 30 days is 9.0. Pt states she is doing 30 units of lantus . Pt reports her lantus  injection hurts. Pt states there were some days she did not give her insulin  due to it hurting.    Pt states they walk the dog 3 times per day. 30 - 40 min  Pt reports she feels she is struggling with her sugar cravings. Pt states she did not buy sweets for the house for a while, but bought muffins last week and ate 4 in one sitting. Pt reports she feels she does not have self control around sweets.   New Goal:  Goal 1: If craving a sweet, go to a bakery and get 1 instead of buying a box to bring home.   Assessment of Previous Goals Established by Patient:    Goal 1: follow the plate method at least once a day. (aim to include 1/2 plate non-starchy vegetables, 1/4 plate protein, and 1/4 plate complex carbs.) - goal met, continue!   Goal 2: choose a more balanced snack. (include a complex carb and protein.) See list. - goal not met, continue.  currently breastfeeding. There is no height or weight on file to calculate BMI.   Diabetes Self-Management Education - 11/14/23 1005       Visit Information   Visit Type Follow-up       Initial Visit   Diabetes Type Type 2    Are you taking your medications as prescribed? Yes      Health Coping   How would you rate your overall health? Good      Psychosocial Assessment   Patient Belief/Attitude about Diabetes Motivated to manage diabetes    What is the hardest part about your diabetes right now, causing you the most concern, or is the most worrisome to you about your diabetes?   Making healty food and beverage choices    Self-care barriers None    Self-management support Doctor's office    Other persons present Patient    Patient Concerns Nutrition/Meal planning    Special Needs None    Preferred Learning Style No preference indicated    Learning Readiness Ready      Pre-Education Assessment   Patient understands the diabetes disease and treatment process. Needs Review    Patient understands incorporating nutritional management into lifestyle. Needs Review    Patient undertands incorporating physical activity into lifestyle. Needs Review    Patient understands using medications safely. Needs Review    Patient understands monitoring blood glucose, interpreting and using results Needs Review    Patient understands prevention, detection, and treatment of acute complications. Needs Review    Patient understands prevention, detection, and treatment of  chronic complications. Needs Review    Patient understands how to develop strategies to address psychosocial issues. Needs Review    Patient understands how to develop strategies to promote health/change behavior. Needs Review      Complications   Last HgB A1C per patient/outside source 10.3 %    How often do you check your blood sugar? 1-2 times/day    Fasting Blood glucose range (mg/dL) 869-820    Postprandial Blood glucose range (mg/dL) 819-799;>799      Dietary Intake   Breakfast oatmeal with blueberries, peanut butter    Snack (morning) granola bar    Lunch chicken, salad, rice OR zaxbys strips and fries     Snack (afternoon) 12 ritz crackers or granola bar    Dinner none    Snack (evening) none    Beverage(s) coffee, diet soda 1x/wk, 80 oz water      Activity / Exercise   Activity / Exercise Type Light (walking / raking leaves)    How many days per week do you exercise? 7    How many minutes per day do you exercise? 30    Total minutes per week of exercise 210      Patient Education   Previous Diabetes Education Yes    Disease Pathophysiology Explored patient's options for treatment of their diabetes    Healthy Eating Role of diet in the treatment of diabetes and the relationship between the three main macronutrients and blood glucose level;Plate Method;Reviewed blood glucose goals for pre and post meals and how to evaluate the patients' food intake on their blood glucose level.;Meal options for control of blood glucose level and chronic complications.;Information on hints to eating out and maintain blood glucose control.    Being Active Helped patient identify appropriate exercises in relation to his/her diabetes, diabetes complications and other health issue.    Medications Reviewed patients medication for diabetes, action, purpose, timing of dose and side effects.    Monitoring Identified appropriate SMBG and/or A1C goals.    Chronic complications Relationship between chronic complications and blood glucose control;Identified and discussed with patient  current chronic complications    Diabetes Stress and Support Identified and addressed patients feelings and concerns about diabetes    Lifestyle and Health Coping Lifestyle issues that need to be addressed for better diabetes care      Individualized Goals (developed by patient)   Nutrition General guidelines for healthy choices and portions discussed    Physical Activity Exercise 5-7 days per week;15 minutes per day    Medications take my medication as prescribed    Monitoring  Test my blood glucose as discussed    Problem Solving Eating  Pattern    Reducing Risk examine blood glucose patterns;do foot checks daily;treat hypoglycemia with 15 grams of carbs if blood glucose less than 70mg /dL    Health Coping Ask for help with psychological, social, or emotional issues      Patient Self-Evaluation of Goals - Patient rates self as meeting previously set goals (% of time)   Nutrition 50 - 75 % (half of the time)    Physical Activity 50 - 75 % (half of the time)    Medications >75% (most of the time)    Monitoring >75% (most of the time)    Problem Solving and behavior change strategies  50 - 75 % (half of the time)    Reducing Risk (treating acute and chronic complications) 50 - 75 % (half of the time)  Health Coping 50 - 75 % (half of the time)      Post-Education Assessment   Patient understands the diabetes disease and treatment process. Comprehends key points    Patient understands incorporating nutritional management into lifestyle. Comprehends key points    Patient undertands incorporating physical activity into lifestyle. Comprehends key points    Patient understands using medications safely. Comphrehends key points    Patient understands monitoring blood glucose, interpreting and using results Comprehends key points    Patient understands prevention, detection, and treatment of acute complications. Comprehends key points    Patient understands prevention, detection, and treatment of chronic complications. Comprehends key points    Patient understands how to develop strategies to address psychosocial issues. Comprehends key points    Patient understands how to develop strategies to promote health/change behavior. Comprehends key points      Outcomes   Expected Outcomes Demonstrated interest in learning. Expect positive outcomes    Future DMSE 4-6 wks    Program Status Not Completed      Subsequent Visit   Since your last visit have you continued or begun to take your medications as prescribed? Yes           Individualized Plan for Diabetes Self-Management Training:   Learning Objective:  Patient will have a greater understanding of diabetes self-management. Patient education plan is to attend individual and/or group sessions per assessed needs and concerns.   Plan:   Patient Instructions  New Goal:  Goal 1: If craving a sweet, go to a bakery and get 1 instead of buying a box to bring home.   Assessment of Previous Goals Established by Patient:    Goal 1: follow the plate method at least once a day. (aim to include 1/2 plate non-starchy vegetables, 1/4 plate protein, and 1/4 plate complex carbs.) - goal met, continue!   Goal 2: choose a more balanced snack. (include a complex carb and protein.) See list. - goal not met, continue.  Expected Outcomes:  Demonstrated interest in learning. Expect positive outcomes  Education material provided: none  (virtual visit)  If problems or questions, patient to contact team via:  Phone  Future DSME appointment: 4-6 wks

## 2023-11-17 ENCOUNTER — Ambulatory Visit (INDEPENDENT_AMBULATORY_CARE_PROVIDER_SITE_OTHER): Admitting: Physician Assistant

## 2023-11-17 ENCOUNTER — Telehealth: Payer: Self-pay | Admitting: *Deleted

## 2023-11-17 ENCOUNTER — Encounter: Payer: Self-pay | Admitting: Physician Assistant

## 2023-11-17 VITALS — BP 122/72 | HR 72 | Temp 97.6°F | Ht 63.0 in | Wt 174.4 lb

## 2023-11-17 DIAGNOSIS — Z794 Long term (current) use of insulin: Secondary | ICD-10-CM | POA: Diagnosis not present

## 2023-11-17 DIAGNOSIS — L03011 Cellulitis of right finger: Secondary | ICD-10-CM

## 2023-11-17 DIAGNOSIS — E119 Type 2 diabetes mellitus without complications: Secondary | ICD-10-CM

## 2023-11-17 DIAGNOSIS — F4312 Post-traumatic stress disorder, chronic: Secondary | ICD-10-CM | POA: Diagnosis not present

## 2023-11-17 MED ORDER — CEPHALEXIN 500 MG PO CAPS
500.0000 mg | ORAL_CAPSULE | Freq: Three times a day (TID) | ORAL | 0 refills | Status: AC
Start: 2023-11-17 — End: 2023-11-24

## 2023-11-17 MED ORDER — FREESTYLE LIBRE 3 PLUS SENSOR MISC: 5 refills | Status: DC

## 2023-11-17 NOTE — Telephone Encounter (Signed)
  Spoke with patient, notified pen needles prescribed are the smallest we order Can discuss with pharmacy for recommendation  Verbalized understanding

## 2023-11-17 NOTE — Progress Notes (Signed)
 Patient ID: Terri Ramirez, female    DOB: 1995/06/30, 28 y.o.   MRN: [id number]   Assessment & Plan:  Type 2 diabetes mellitus with insulin  therapy (HCC) -     FreeStyle Libre 3 Plus Sensor; Change sensor every 15 days.  Dispense: 1 each; Refill: 5  Cellulitis of right finger  Other orders -     Cephalexin ; Take 1 capsule (500 mg total) by mouth 3 (three) times daily for 7 days.  Dispense: 21 capsule; Refill: 0    Assessment & Plan Type 2 diabetes mellitus Recent discontinuation of metformin  due to significant diarrhea. Current insulin  therapy with Lantus  SoloStar pen. A1c improved from 8.7% to 9.2% over the last 30 days. Reports pain during insulin  injections, possibly due to injection technique or pen type. Nutritionist involvement with dietary improvements noted. - Continue insulin  therapy with Lantus  SoloStar pen. - Submitted prescription for [employer] sensor to insurance. - Will explore insurance coverage for a different type of insulin  pen. - Consider lidocaine  for injection site pain. - Continue follow-up with nutritionist in 5-6 weeks. Lab Results  Component Value Date   HGBA1C 10.3 10/01/2023   HGBA1C 5.6 03/12/2022     Cellulitis of right finger Cellulitis of the right ring finger, likely secondary to nail biting and subsequent infection. Symptoms include swelling, pain, and throbbing sensation. Antibiotics deemed necessary due to severity and risk of worsening infection. - Prescribed Keflex  (cephalexin ) three times a day. - Advised Epsom salt soaks for the affected finger. - Monitor for signs of worsening infection.   F/up as scheduled    Subjective:    Chief Complaint  Patient presents with   Diabetes    Needs more libra's, down to her last box. Feels like her diabetes is under control. Also having some finger pain on her right fourth finger.    Hand Pain    HPI Discussed the use of AI scribe software for clinical note transcription with the  patient, who gave verbal consent to proceed.  History of Present Illness Terri Ramirez is a 28 year old female with diabetes who presents with concerns about a swollen finger.  She is managing her diabetes with insulin  therapy after discontinuing metformin  due to significant diarrhea. Her last A1c was approximately 8.7%, and recent glucose monitoring shows an average level of 9.2% over the last 30 days. She uses a Lantus  SoloStar pen, administering 30 to 32 units in the morning, and experiences pain during insulin  administration, possibly due to the angle of injection. She uses four millimeter needles and has not tried lidocaine  for pain relief. She is working with a nutritionist to improve her diet but admits to occasional snacking.  She has significant pain and swelling in her right ring finger, present for about four days. The pain is severe, with a sensation of blood rushing to the area, and worsens with touch or washing. She suspects the swelling may be related to nail biting and possible infection. No home treatments have been attempted.  Her social history includes living in an apartment with plans to move by December 18th due to lease non-renewal. She is seeking a house to rent to accommodate her family and foster dogs. 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     Past Medical History:  Diagnosis Date   Anxiety    Gestational diabetes    Low blood pressure    Low sodium levels     History  reviewed. No pertinent surgical history.  Family History  Adopted: Yes  Family history unknown: Yes    Social History   Tobacco Use   Smoking status: Never   Smokeless tobacco: Never  Vaping Use   Vaping status: Never Used  Substance Use Topics   Alcohol use: Not Currently   Drug use: Not Currently    Types: Marijuana    Comment: none during preg     No Known Allergies  Review of Systems NEGATIVE UNLESS OTHERWISE INDICATED IN  HPI      Objective:     BP 122/72   Pulse 72   Temp 97.6 F (36.4 C) (Temporal)   Ht 5' 3 (1.6 m)   Wt 174 lb 6.4 oz (79.1 kg)   LMP 10/31/2023 (Exact Date)   SpO2 97%   Breastfeeding Yes   BMI 30.89 kg/m   Wt Readings from Last 3 Encounters:  11/17/23 174 lb 6.4 oz (79.1 kg)  10/27/23 174 lb (78.9 kg)  10/19/23 173 lb 8 oz (78.7 kg)    BP Readings from Last 3 Encounters:  11/17/23 122/72  10/27/23 138/84  10/19/23 (!) 125/95     Physical Exam Vitals and nursing note reviewed.  Constitutional:      Appearance: Normal appearance. She is obese.  Eyes:     Extraocular Movements: Extraocular movements intact.     Conjunctiva/sclera: Conjunctivae normal.     Pupils: Pupils are equal, round, and reactive to light.  Cardiovascular:     Rate and Rhythm: Normal rate and regular rhythm.     Pulses: Normal pulses.     Heart sounds: Normal heart sounds.  Skin:    Findings: Erythema (distal end R ring phalanx slightly edematous, erythematous, TTP; n/v intact, full ROM intact) present.  Neurological:     General: No focal deficit present.     Mental Status: She is alert and oriented to person, place, and time.  Psychiatric:        Mood and Affect: Mood normal.        Behavior: Behavior normal.             Violia Knopf M Jerrard Bradburn, PA-C

## 2023-11-20 ENCOUNTER — Ambulatory Visit: Payer: Self-pay

## 2023-11-20 NOTE — Telephone Encounter (Signed)
 FYI Only or Action Required?: FYI only for provider: appointment scheduled on 11/21/2023.  Patient was last seen in primary care on 11/17/2023 by Allwardt, Mardy HERO, PA-C.  Called Nurse Triage reporting Arm Swelling.  Symptoms began Monday and worsening.  Interventions attempted: Nothing.  Symptoms are: gradually worsening.  Triage Disposition: See Physician Within 24 Hours  Patient/caregiver understands and will follow disposition?: Yes    Copied from CRM #[id number]. Topic: Clinical - Red Word Triage >> Nov 20, 2023  3:12 PM Terri Ramirez wrote: [facility] that prompted transfer to Nurse Triage: The patient's finger has worsening swelling. Reason for Disposition  Weakness (i.e., loss of strength) of new-onset in hand or fingers  (Exceptions: Not truly weak, hand feels weak because of pain; weakness present > 2 weeks)  Answer Assessment - Initial Assessment Questions 1. ONSET: When did the pain start?     Monday and worsening 2. LOCATION: Where is the pain located?     Right ring finger 3. PAIN: How bad is the pain? (Scale 0-10; or none, mild, moderate, severe)     moderate 4. WORK OR EXERCISE: Has there been any recent work or exercise that involved this part of the body?     na 5. CAUSE: What do you think is causing the arm pain?     unknown 6. OTHER SYMPTOMS: Do you have any other symptoms? (e.g., neck pain, swelling, rash, fever, numbness, weakness)     Worsening swelling, reddish/greenish,purple, warm to touch, 7. PREGNANCY: Is there any chance you are pregnant? When was your last menstrual  Na  No drainage, no fever, pain  Protocols used: Arm Pain-A-AH

## 2023-11-21 ENCOUNTER — Encounter: Payer: Self-pay | Admitting: Family Medicine

## 2023-11-21 ENCOUNTER — Ambulatory Visit (INDEPENDENT_AMBULATORY_CARE_PROVIDER_SITE_OTHER): Admitting: Family Medicine

## 2023-11-21 ENCOUNTER — Ambulatory Visit: Admitting: Physician Assistant

## 2023-11-21 VITALS — BP 110/68 | HR 87 | Temp 98.3°F | Ht 63.0 in | Wt 177.4 lb

## 2023-11-21 DIAGNOSIS — L03011 Cellulitis of right finger: Secondary | ICD-10-CM | POA: Diagnosis not present

## 2023-11-21 NOTE — Patient Instructions (Addendum)
 We discussed changing to Augmentin as an option but you wanted to hold steady and continue current antibiotic(s) with more regular soaks- id do at least 4 a day  Hopefully area will spontaneously open and drain  We discussed I+D but you were concerned about this and wanted to hold steady  Recommended follow up: Tuesday at 9 am with [facility]- team scheduling

## 2023-11-21 NOTE — Progress Notes (Signed)
 Terri Ramirez                                          MRN: [id number]   11/21/2023   The [facility]BCI Quality Team Specialist reviewed this patient medical record for the purposes of chart review for care gap closure. The following were reviewed: abstraction for care gap closure-kidney health evaluation for diabetes:eGFR  and uACR.    VBCI Quality Team

## 2023-11-21 NOTE — Progress Notes (Signed)
 Phone [phone] In person visit   Subjective:   Raniah Karan is a 28 y.o. year old very pleasant female patient who presents for/with See problem oriented charting Chief Complaint  Patient presents with   Finger Swelling    Right ring finger swelling; painful 9 out of 10 pain scale; warm to the touch; worried because she is diabetic; she feels the antibiotics are not working that she was prescribed on Monday by Alyssa;    Past Medical History-  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     cephALEXin  (KEFLEX ) 500 MG capsule Take 1 capsule (500 mg total) by mouth 3 (three) times daily for 7 days. 21 capsule 0   Continuous Glucose Sensor (FREESTYLE LIBRE 3 PLUS SENSOR) MISC Change sensor every 15 days. 1 each 5   EMBECTA PEN NEEDLE NANO 2 GEN 32G X 4 MM MISC as directed.     escitalopram  (LEXAPRO ) 20 MG tablet Take 1 tablet (20 mg total) by mouth daily. 30 tablet 1   ibuprofen  (ADVIL ) 600 MG tablet Take 1 tablet (600 mg total) by mouth every 6 (six) hours as needed. 40 tablet 1   insulin  glargine (LANTUS ) 100 UNIT/ML Solostar Pen Take 10 units, increase by 2 units every 3 days until fasting blood sugars are under 120 units. 15 mL 11   Prenatal Vit-Fe Fumarate-FA (PRENATAL MULTIVITAMIN) TABS tablet Take 1 tablet by mouth daily at 12 noon.     No current facility-administered medications for this visit.     Objective:  BP 110/68 (BP Location: Left Arm, Patient Position: Sitting, Cuff  Size: Normal)   Pulse 87   Temp 98.3 F (36.8 C) (Temporal)   Ht 5' 3 (1.6 m)   Wt 177 lb 6.4 oz (80.5 kg)   LMP 10/31/2023 (Exact Date)   SpO2 97%   BMI 31.42 kg/m  Gen: NAD, resting comfortably CV: RRR no murmurs rubs or gallops Lungs: CTAB no crackles, wheeze, rhonchi Ext: no edema, right ring finger on ulnar side with swelling, erythema, warmth along nail border- somewhat tender throughout finger but mostly along nail fold. No significant pulp space tenderness Skin: warm, dry    Assessment and Plan   # Right ring finger swelling S: Patient was seen 11/17/2023 by her primary care provider Alyssa Allwardt, PA-they were concerned for cellulitis of the right ring finger thought related to nail biting and subsequent infection.  At that time she noted swelling, pain, throbbing.  She was started on cephalexin  3 times a day and advised Epsom salt soaks with close follow-up if fail to improve.  She is breast-feeding.   Since Monday slightly worsened and then yesterday seemed worse with severe pain- did some epsom soak and lidocaine  and neosporin with some benefit A/P: This appears to be a paronychia to me-with that being said there is not a clear pocket of purulent material to I&D though we still actively consider it-she wanted to give it some more time since she seen some improvement with epsom soak yesterday -We discussed she  could use some Tylenol  for pain control -Offered changing to Augmentin for oral flora coverage but she declines this after I discussed potential side effects to infant from up-to-date that she prefers to continue on Keflex  -Avoiding biting nails.  Can use lidocaine  or Neosporin topically. No sign of felon -Discussed close return precautions and emergent indications such as fever, worsening pain -Schedule close follow-up on Tuesday with Alyssa  Recommended follow up: Tuesday at 9 am with Alyssa- team scheduling Future Appointments  Date Time Provider Department  Center  11/25/2023  9:00 AM Allwardt, Mardy HERO, PA-C [facility]  12/05/2023 11:00 AM Helmus, Jitka, PT [facility] None  01/05/2024 11:00 AM Allwardt, Mardy HERO, PA-C [facility]    Lab/Order associations:   ICD-10-CM   1. Paronychia of finger, right  L03.011       No orders of the defined types were placed in this encounter.  I personally spent a total of 25 minutes in the care of the patient today including preparing to see the patient, getting/reviewing separately obtained history, performing a medically appropriate exam/evaluation, counseling and educating, and documenting clinical information in the EHR.   Return precautions advised.  Garnette Lukes, MD

## 2023-11-21 NOTE — Progress Notes (Signed)
 Terri Ramirez                                          MRN: [id number]   11/21/2023   The VBCI Quality Team Specialist reviewed this patient medical record for the purposes of chart review for care gap closure. The following were reviewed: chart review for care gap closure-glycemic status assessment.    VBCI Quality Team

## 2023-11-24 DIAGNOSIS — F4312 Post-traumatic stress disorder, chronic: Secondary | ICD-10-CM | POA: Diagnosis not present

## 2023-11-25 ENCOUNTER — Ambulatory Visit (INDEPENDENT_AMBULATORY_CARE_PROVIDER_SITE_OTHER): Admitting: Physician Assistant

## 2023-11-25 VITALS — BP 108/74 | HR 85 | Temp 98.2°F | Ht 63.0 in | Wt 178.0 lb

## 2023-11-25 DIAGNOSIS — L03011 Cellulitis of right finger: Secondary | ICD-10-CM

## 2023-11-25 NOTE — Progress Notes (Signed)
 Patient ID: Terri Ramirez, female    DOB: 27-Jan-1995, 27 y.o.   MRN: [id number]   Assessment & Plan:  Paronychia of finger, right      Assessment and Plan Assessment & Plan Cellulitis and paronychia of right ring finger (resolved) Cellulitis and paronychia of the right ring finger have resolved. Initially presented with increased pain and concern due to diabetes. Declined Augmentin due to breastfeeding. Managed with Epsom salt soaks and Tylenol  for pain. Self-drained pus and blood, leading to improvement. Reports significant improvement and resolution of symptoms. - Encouraged to avoid nail biting. - No further follow-up needed for finger issue.    F/up prn     Subjective:    Chief Complaint  Patient presents with   Hand Pain    Pt in office to f/u on finger infection; pt states finger is healing and feels much better; pt stopped taking antibiotic once healed;     Hand Pain    Discussed the use of AI scribe software for clinical note transcription with the patient, who gave verbal consent to proceed.  History of Present Illness Terri Ramirez is a 28 year old female with diabetes who presents for a recheck on paronychia cellulitis of her right ring finger.  She initially presented with paronychia cellulitis of her right ring finger and was prescribed Keflex . Her pain worsened, raising concerns due to her diabetes. She was offered a change to Augmentin but declined due to potential side effects while breastfeeding.  She attempted Epsom salt soaks and used Tylenol  for pain management. Despite these efforts, she did not feel better initially. However, she later performed self-drainage of the pus, which led to significant improvement in her symptoms. She reported that 'yellow stuff, a lot of yellow stuff came out' followed by blood, and since then, the swelling and pain have decreased significantly.  She is breastfeeding her infant.     Past Medical History:   Diagnosis Date   Anxiety    Gestational diabetes    Low blood pressure    Low sodium levels     No past surgical history on file.  Family History  Adopted: Yes  Family history unknown: Yes    Social History   Tobacco Use   Smoking status: Never   Smokeless tobacco: Never  Vaping Use   Vaping status: Never Used  Substance Use Topics   Alcohol use: Not Currently   Drug use: Not Currently    Types: Marijuana    Comment: none during preg     No Known Allergies  Review of Systems NEGATIVE UNLESS OTHERWISE INDICATED IN HPI      Objective:     BP 108/74 (BP Location: Left Arm, Patient Position: Sitting, Cuff Size: Normal)   Pulse 85   Temp 98.2 F (36.8 C) (Temporal)   Ht 5' 3 (1.6 m)   Wt 178 lb (80.7 kg)   LMP 10/31/2023 (Exact Date)   SpO2 98%   Breastfeeding Yes   BMI 31.53 kg/m   Wt Readings from Last 3 Encounters:  11/25/23 178 lb (80.7 kg)  11/21/23 177 lb 6.4 oz (80.5 kg)  11/17/23 174 lb 6.4 oz (79.1 kg)    BP Readings from Last 3 Encounters:  11/25/23 108/74  11/21/23 110/68  11/17/23 122/72     Physical Exam Constitutional:      Appearance: Normal appearance.  Eyes:     Extraocular Movements: Extraocular movements intact.     Conjunctiva/sclera: Conjunctivae normal.  Pupils: Pupils are equal, round, and reactive to light.  Cardiovascular:     Rate and Rhythm: Normal rate.  Pulmonary:     Effort: Pulmonary effort is normal.  Skin:    Findings: No erythema, lesion or rash.  Neurological:     Mental Status: She is alert.     Sensory: No sensory deficit.  Psychiatric:        Mood and Affect: Mood normal.        Behavior: Behavior normal.             Edgar Reisz M Deazia Lampi, PA-C

## 2023-11-26 DIAGNOSIS — H5213 Myopia, bilateral: Secondary | ICD-10-CM | POA: Diagnosis not present

## 2023-11-26 DIAGNOSIS — H52203 Unspecified astigmatism, bilateral: Secondary | ICD-10-CM | POA: Diagnosis not present

## 2023-11-26 DIAGNOSIS — E119 Type 2 diabetes mellitus without complications: Secondary | ICD-10-CM | POA: Diagnosis not present

## 2023-11-26 LAB — OPHTHALMOLOGY REPORT-SCANNED

## 2023-12-04 NOTE — Therapy (Addendum)
 OUTPATIENT PHYSICAL THERAPY FEMALE PELVIC EVALUATION   Patient Name: Terri Ramirez MRN: [id number] DOB:April 11, 1995, 28 y.o., female Today's Date: 12/05/2023  END OF SESSION:  PT End of Session - 12/05/23 1204     Visit Number 1    Date for Recertification  06/03/24    Authorization Type BCBS, [place] medicaid [place]    PT Start Time 1107    PT Stop Time 1200    PT Time Calculation (min) 53 min    Activity Tolerance Patient tolerated treatment well    Behavior During Therapy WFL for tasks assessed/performed          Past Medical History:  Diagnosis Date   Anxiety    Gestational diabetes    Low blood pressure    Low sodium levels    History reviewed. No pertinent surgical history. 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    PCP: Allwardt, Mardy HERO, PA-C  REFERRING PROVIDER: Verta Blossom, CNM  REFERRING DIAG: [phone] (ICD-10-CM) - Other specified disorders of muscle  THERAPY DIAG:  Other low back pain  Muscle weakness (generalized)  Rationale for Evaluation and Treatment: Rehabilitation  ONSET DATE: 8 months ago- since birth 03/15/2023  SUBJECTIVE:                                                                                                                                                                                           SUBJECTIVE STATEMENT: Patient reports that after she gave birth, her pelvic floor has been really really weak.  Cannot hold her pee, sometimes feels heavy, feels like her insides will fall out Feels like she never healed down there, a single mom, went to work 2 months PP  Fluid intake: drinks a lot of water  FUNCTIONAL  LIMITATIONS: works nights, urinates often, single mom  PERTINENT HISTORY:  Medications for current condition: no Surgeries: no Other: no Sexual abuse: No  PAIN:  Are you having pain? No pelvic pain- really bad back pain since birth NPRS scale: 5/10 Pain location: back  Pain type: aching Pain description: constant   Aggravating factors: hurts constantly, might be how they co sleep Relieving factors: nothing  PRECAUTIONS: None  RED FLAGS: None   WEIGHT BEARING RESTRICTIONS: No  FALLS:  Has patient fallen in last 6 months? 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,  lifts things, picks up boxes, walks, wears steel toe shoes  ACTIVITY LEVEL : active at work, walks her dog  PLOF: Independent  PATIENT GOALS: to put her pelvic floor where it was, feels like her muscles are so weak   BOWEL MOVEMENT: no issues URINATION: cannot hold her pee, sometimes feels heavy, feels like her insides will fall out Pain with urination: No Fully empty bladder: No                                         Post-void dribble: Yes a little Stream: Strong Urgency: Yes  Frequency:during the day yes                                                        Nocturia: no, gets up with the baby   Leakage: Urge to void Pads/briefs: No  INTERCOURSE: not active   PREGNANCY: Vaginal deliveries 1 Tearing Yes: on right side, baby was 8.5 lbs Episiotomy No C-section deliveries 0 Currently pregnant No  PROLAPSE: Heaviness and pressure   OBJECTIVE:  Note: Objective measures were completed at Evaluation unless otherwise noted.  DIAGNOSTIC FINDINGS:  Post-void residual: Voiding Cystourethrogram (VCUG):  Ultrasound:   PATIENT SURVEYS:    PFIQ-7: 105 UIQ-7 33 CRAIG -7 45 POPIQ-7 43   COGNITION: Overall cognitive status: Within functional limits for tasks assessed     SENSATION: Light touch: Appears intact  LUMBAR SPECIAL TESTS:  Slump test:  Negative  FUNCTIONAL TESTS:   Single leg stance: to be tested  Rt:  Lt: Sit-up test: difficult Squat: Bed mobility:breath holding  GAIT: Assistive device utilized: None Comments: WFL  POSTURE: rounded shoulders, forward head, and increased lumbar lordosis   LUMBARAROM/PROM: full  A/PROM A/PROM  Eval (% available)  Flexion   Extension   Right lateral flexion   Left lateral flexion   Right rotation   Left rotation    (Blank rows = not tested)  LOWER EXTREMITY MNF:qloo LOWER EXTREMITY MMT: bilateral hips 4-/5 PALPATION:    Pelvic Alignment: even  Abdominal:   Diastasis: Yes: 1 finger Distortion: Yes  - doming Breathing: breath holding strategies with lifting baby, #10 kettle bell and transfers  Scar tissue: Yes:  at vaginal opening Active Straight Leg Raise: NT                External Perineal Exam: mild dryness throughout, signs of decreased estrogen                             Internal Pelvic Floor: able to lift pelvic floor, stool in rectum, improved lift with exhale with theraband horizontal abduction and VC's  Patient confirms identification and approves PT to assess internal pelvic floor and treatment Yes All internal or external pelvic floor assessments and/or treatments are completed with proper hand hygiene and gloves hands. If needed gloves are changed with hand hygiene during patient care time.  PELVIC MMT:   MMT eval  Vaginal 2/5  Internal Anal Sphincter   External Anal Sphincter   Puborectalis   (Blank rows = not tested)        TONE: average  PROLAPSE: Mild posterior vaginal wall laxity and stool  in rectum, bladder descent with cough  TODAY'S TREATMENT:                                                                                                                              DATE: 12/05/23  EVAL  Examination completed, findings reviewed, pt educated on POC, HEP, and female pelvic floor anatomy, reasoning with pelvic floor assessment  internally with pt consent. Pt motivated to participate in PT and agreeable to attempt recommendations.     PATIENT EDUCATION:  Education details: Pt was educated on relevant anatomy, exam findings, home exercise program, plan of care, expectations of PT and pressure management exercises with exhale  Person educated: Patient Education method: Explanation, Demonstration, Tactile cues, Verbal cues, and Handouts Education comprehension: verbalized understanding, returned demonstration, verbal cues required, tactile cues required, and needs further education  HOME EXERCISE PROGRAM: Access Code: [email or web address] URL: [email or web address] Date: 12/05/2023 Prepared by: Cori Najib Colmenares  Exercises - Diaphragmatic Breathing to Reduce Intra-abdominal Pressure: Lifting a Basket  - 1 x daily - 7 x weekly - 2 sets - 10 reps - Diaphragmatic Breathing to Reduce Intra-abdominal Pressure: Sit to Stand  - 1 x daily - 7 x weekly - 2 sets - 10 reps - Horizontal abd with TB with TRA breath  - 1 x daily - 7 x weekly - 2 sets - 10 reps  ASSESSMENT:  CLINICAL IMPRESSION: Patient is a 28 y.o. F who was seen today for physical therapy evaluation and treatment for pelvic pressure and urge incontinence. She also has significant low back pain. Patient reports that symptoms have been bothersome. Patient reports that she feels like her organs are going to fall out. Exam findings are notable for upper chest breathing strategies, abdominal weakness with doming, pelvic floor muscle weakness, average tone in pelvic floor. External soft tissues of pelvic floor appear dry with signs of decreased estrogen. Patient demonstrates fair trunk mobility, bilateral hip weakness  and pain in her back. It is difficult for patient to lift boxes at work and baby due to weakness, she compensates with breath holding and increases pressure on her pelvic floor . Discussed findings with patient, educated patient on pressure management  strategies, practices some in the clinic with thera band and baby and HEP was initiated. Patient's quality of life has been affected, patient is frustrated and will benefit from physical therapy to address deficits, reduce pelvic pressure, low back pain and urge incontinence and improve quality of life.    OBJECTIVE IMPAIRMENTS: decreased activity tolerance, decreased coordination, decreased endurance, decreased ROM, decreased strength, increased muscle spasms, impaired tone, and pain.   ACTIVITY LIMITATIONS: carrying, lifting, bending, standing, transfers, continence, toileting, and caring for others  PARTICIPATION LIMITATIONS: community activity and occupation  PERSONAL FACTORS: Time since onset of injury/illness/exacerbation are also affecting patient's functional outcome.   REHAB POTENTIAL: Good  CLINICAL DECISION MAKING: Evolving/moderate complexity  EVALUATION COMPLEXITY: Moderate   GOALS: Goals reviewed with patient? Yes  SHORT TERM  GOALS: Target date: 01/02/2024    Pt will be independent with HEP.   Baseline: Goal status: INITIAL  2.  Pt will be independent with use of squatty potty, relaxed toileting mechanics, and improved bowel movement techniques in order to increase ease of bowel movements and complete evacuation.   Baseline:  Goal status: INITIAL  3.   Pt will demonstrate appropriate lateral rib cage excursion with inhale to ensure better abdominal pressure management and pelvic floor/abdominal muscle relaxation without overusing upper traps in order to demonstrate better pressure management strategies with lifting at work and baby Baseline:  Goal status: INITIAL   LONG TERM GOALS: Target date: 06/03/2024  Pt will be independent with advanced HEP.   Baseline:  Goal status: INITIAL  2.  Pt will soak 0 pads/ day in order to run errands and not have to interrupt daily tasks.  Baseline:  Goal status: INITIAL  3.  Pt will have reduced low back pain to max 1/10  in order to be able to do functional activities such as bending, lifting, twisting and walking as needed to be able to take care of her family and participate in job duties.  Baseline:  Goal status: INITIAL  4.  Patient will be able to stand and walk at work for 12 hours without increased low back pain Baseline:  Goal status: INITIAL  5.  Patient will demonstrate bilateral hip pain 5/5 in order to increase lumbopelvic support Baseline:  Goal status: INITIAL    PLAN:  PT FREQUENCY: 1-2x/week  PT DURATION: 6 months  PLANNED INTERVENTIONS: 97110-Therapeutic exercises, 97530- Therapeutic activity, 97112- Neuromuscular re-education, 97535- Self Care, [id number]- Manual therapy, [phone]- Electrical stimulation (manual), [phone]- Ionotophoresis 4mg /ml Dexamethasone, 79439 (1-2 muscles), 20561 (3+ muscles)- Dry Needling, Patient/Family education, Taping, Joint mobilization, Joint manipulation, Spinal manipulation, Spinal mobilization, Scar mobilization, Cryotherapy, Moist heat, and Biofeedback  PLAN FOR NEXT SESSION: ortho- focus on hip and abdominal strengthening with exhale on exertion to reduce pressure on pelvic floor- nustep, leg press, back strengthening, VC's to manage pressures Pelvic- education on urge suppression, constipation strategies, core coordination    Rickell Wiehe, PT 12/05/2023, 12:23 PM

## 2023-12-05 ENCOUNTER — Other Ambulatory Visit: Payer: Self-pay

## 2023-12-05 ENCOUNTER — Ambulatory Visit: Admitting: Physical Therapy

## 2023-12-05 ENCOUNTER — Encounter: Payer: Self-pay | Admitting: Physical Therapy

## 2023-12-05 ENCOUNTER — Ambulatory Visit: Attending: Obstetrics and Gynecology | Admitting: Physical Therapy

## 2023-12-05 DIAGNOSIS — M6281 Muscle weakness (generalized): Secondary | ICD-10-CM | POA: Insufficient documentation

## 2023-12-05 DIAGNOSIS — M25561 Pain in right knee: Secondary | ICD-10-CM | POA: Diagnosis not present

## 2023-12-05 DIAGNOSIS — M2141 Flat foot [pes planus] (acquired), right foot: Secondary | ICD-10-CM | POA: Insufficient documentation

## 2023-12-05 DIAGNOSIS — M5459 Other low back pain: Secondary | ICD-10-CM | POA: Insufficient documentation

## 2023-12-05 DIAGNOSIS — G8929 Other chronic pain: Secondary | ICD-10-CM | POA: Insufficient documentation

## 2023-12-05 DIAGNOSIS — M25571 Pain in right ankle and joints of right foot: Secondary | ICD-10-CM | POA: Diagnosis not present

## 2023-12-06 ENCOUNTER — Telehealth: Admitting: Physician Assistant

## 2023-12-06 DIAGNOSIS — K0889 Other specified disorders of teeth and supporting structures: Secondary | ICD-10-CM

## 2023-12-06 MED ORDER — IBUPROFEN 800 MG PO TABS: 800.0000 mg | ORAL_TABLET | Freq: Three times a day (TID) | ORAL | 0 refills | Status: AC | PRN

## 2023-12-06 MED ORDER — AMOXICILLIN-POT CLAVULANATE 875-125 MG PO TABS: 1.0000 | ORAL_TABLET | Freq: Two times a day (BID) | ORAL | 0 refills | Status: AC

## 2023-12-06 NOTE — Patient Instructions (Signed)
  Lauraine Balk, thank you for joining Teena Shuck, PA-C for today's virtual visit.  While this provider is not your primary care provider (PCP), if your PCP is located in our provider database this encounter information will be shared with them immediately following your visit.   A [place] [place] MyChart account gives you access to today's visit and all your visits, tests, and labs performed at [facility]  click here if you don't have a [facility] MyChart account or go to [email or web address]  Consent: (Patient) Terri Ramirez provided verbal consent for this virtual visit at the beginning of the encounter.  Current Medications:  Current Outpatient Medications:    acetaminophen  (TYLENOL ) 500 MG tablet, Take 500 mg by mouth every 6 (six) hours as needed., Disp: , Rfl:    Continuous Glucose Sensor (FREESTYLE LIBRE 3 PLUS SENSOR) MISC, Change sensor every 15 days., Disp: 1 each, Rfl: 5   EMBECTA PEN NEEDLE NANO 2 GEN 32G X 4 MM MISC, as directed., Disp: , Rfl:    escitalopram  (LEXAPRO ) 20 MG tablet, Take 1 tablet (20 mg total) by mouth daily., Disp: 30 tablet, Rfl: 1   insulin  glargine (LANTUS ) 100 UNIT/ML Solostar Pen, Take 10 units, increase by 2 units every 3 days until fasting blood sugars are under 120 units., Disp: 15 mL, Rfl: 11   Prenatal Vit-Fe Fumarate-FA (PRENATAL MULTIVITAMIN) TABS tablet, Take 1 tablet by mouth daily at 12 noon., Disp: , Rfl:    Medications ordered in this encounter:  No orders of the defined types were placed in this encounter.    *If you need refills on other medications prior to your next appointment, please contact your pharmacy*  Follow-Up: Call back or seek an in-person evaluation if the symptoms worsen or if the condition fails to improve as anticipated.  [facility] [phone]  Other Instructions Report to ER with worsening symptoms.    If you have been instructed to have an in-person evaluation today at a  local Urgent Care facility, please use the link below. It will take you to a list of all of our available [facility], including address, phone number and hours of operation. Please do not delay care.  [facility]  If you or a family member do not have a primary care provider, use the link below to schedule a visit and establish care. When you choose a [facility] primary care physician or advanced practice provider, you gain a long-term partner in health. Find a Primary Care Provider  Learn more about [facility]'s in-office and virtual care options: [place] - Get Care Now

## 2023-12-06 NOTE — Progress Notes (Signed)
 Virtual Visit Consent   Cleveland Paiz, you are scheduled for a virtual visit with a [facility] provider today. Just as with appointments in the office, your consent must be obtained to participate. Your consent will be active for this visit and any virtual visit you may have with one of our providers in the next 365 days. If you have a MyChart account, a copy of this consent can be sent to you electronically.  As this is a virtual visit, video technology does not allow for your provider to perform a traditional examination. This may limit your provider's ability to fully assess your condition. If your provider identifies any concerns that need to be evaluated in person or the need to arrange testing (such as labs, EKG, etc.), we will make arrangements to do so. Although advances in technology are sophisticated, we cannot ensure that it will always work on either your end or our end. If the connection with a video visit is poor, the visit may have to be switched to a telephone visit. With either a video or telephone visit, we are not always able to ensure that we have a secure connection.  By engaging in this virtual visit, you consent to the provision of healthcare and authorize for your insurance to be billed (if applicable) for the services provided during this visit. Depending on your insurance coverage, you may receive a charge related to this service.  I need to obtain your verbal consent now. Are you willing to proceed with your visit today? Alletta Mattos has provided verbal consent on 12/06/2023 for a virtual visit (video or telephone). Teena Shuck, [place]  Date: 12/06/2023 10:40 AM   Virtual Visit via Video Note   ITeena Shuck, connected with  Dani Wallner  ([id number], 10-07-1979) on 12/06/23 at 10:30 AM EST by a video-enabled telemedicine application and verified that I am speaking with the correct person using two identifiers.  Location: Patient: Virtual Visit Location  Patient: Home Provider: Virtual Visit Location Provider: Home Office   I discussed the limitations of evaluation and management by telemedicine and the availability of in person appointments. The patient expressed understanding and agreed to proceed.    History of Present Illness: Haizel Gatchell is a 28 y.o. who identifies as a female who was assigned female at birth, and is being seen today for dental pain.  HPI: Dental Pain  This is a new problem. The current episode started 1 to 4 weeks ago. The problem has been unchanged. The pain is at a severity of 6/10. Associated symptoms include facial pain. She has tried acetaminophen  and NSAIDs for the symptoms. The treatment provided no relief.    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    Allergies: No Known Allergies Medications:  Current Outpatient Medications:    acetaminophen  (TYLENOL ) 500 MG tablet, Take 500 mg by mouth every 6 (six) hours as needed., Disp: , Rfl:    Continuous Glucose Sensor (FREESTYLE LIBRE 3 PLUS SENSOR) MISC, Change sensor every 15 days., Disp: 1 each, Rfl: 5   EMBECTA PEN NEEDLE NANO 2 GEN 32G X 4 MM MISC, as directed., Disp: , Rfl:    escitalopram  (LEXAPRO ) 20 MG tablet, Take 1 tablet (20  mg total) by mouth daily., Disp: 30 tablet, Rfl: 1   insulin  glargine (LANTUS ) 100 UNIT/ML Solostar Pen, Take 10 units, increase by 2 units every 3 days until fasting blood sugars are under 120 units., Disp: 15 mL, Rfl: 11   Prenatal Vit-Fe Fumarate-FA (PRENATAL MULTIVITAMIN) TABS tablet, Take 1 tablet by mouth daily at 12 noon., Disp: , Rfl:   Observations/Objective: Patient is well-developed, well-nourished in  no acute distress.  Resting comfortably  at home.  Head is normocephalic, atraumatic.  No labored breathing.  Speech is clear and coherent with logical content.  Patient is alert and oriented at baseline.  Left lower back molar swelling   Assessment and Plan: 1. Pain, dental (Primary)  Patients' presentation consistent with dental pain. No evidence of Ludwig's Angina, large abscess pocket, requirement for emergent extraction, or other complications. Provided prescription for Augmentin  and Ibuprofen . Patient informed to follow up with local dentist. Report to ER if pain uncontrolled, abscess that drains purulent fluid, high fevers, trouble swallowing, or other concerns. Expressed understanding of and agreement with plan and all questions answered.  Follow Up Instructions: I discussed the assessment and treatment plan with the patient. The patient was provided an opportunity to ask questions and all were answered. The patient agreed with the plan and demonstrated an understanding of the instructions.  A copy of instructions were sent to the patient via MyChart unless otherwise noted below.    The patient was advised to call back or seek an in-person evaluation if the symptoms worsen or if the condition fails to improve as anticipated.    Teena Shuck, PA-C

## 2023-12-08 ENCOUNTER — Telehealth: Payer: Self-pay | Admitting: Physician Assistant

## 2023-12-08 NOTE — Telephone Encounter (Signed)
 Pt called back stating fever & chills due to dental issue. Access Nurse tried 3 attempts to reach pt.   Patient Name First: [facility] Last: [facility] Gender: Female DOB: 1995/01/17 Age: 28 Y 11 M 13 D Return Phone Number: [phone] (Primary) Address: City/ State/ Zip: [place]  [address] Client [facility] - Human [facility] Type Call Who Is Calling Patient / Member / Family / Caregiver Call Type Triage / Clinical Relationship To Patient Self Return Phone Number [phone] (Primary) Chief Complaint Facial Swelling Reason for Call Symptomatic / Request for Health Information Initial Comment Caller states she has a fever and chills due to wisdom tooth pain. Translation No Disp. Time Titus Time) Disposition Final User 12/07/2023 10:27:05 PM Attempt made - no message left Merribeth OBIE Lenis 12/07/2023 10:48:59 PM FINAL ATTEMPT MADE - no message left Merribeth OBIE Lenis 12/07/2023 10:49:12 PM Clinical Call Yes Merribeth RN, David Final Disposition 12/07/2023 10:49:12 PM Clinical Call Yes Merribeth, RN, Lenis

## 2023-12-08 NOTE — Telephone Encounter (Signed)
 Please see nurse triage for patient and advise

## 2023-12-08 NOTE — Telephone Encounter (Signed)
 Noted

## 2023-12-08 NOTE — Telephone Encounter (Signed)
 Pt was advised to go to UC due to dental pain. Pt had a virtual visit on 12/06/23.  Patient Name First: Terri Ramirez Last: Terri Ramirez Gender: Female DOB: 22-Oct-1995 Age: 28 Y 11 M 11 D Return Phone Number: [phone] (Primary) Address: City/ State/ Zip: [place]  [address] Client [facility] - Human Resources Officer [facility] Provider Waddell Rake- MD Contact Type Call Who Is Calling Patient / Member / Family / Caregiver Call Type Triage / Clinical Relationship To Patient Self Return Phone Number [phone] (Primary) Chief Complaint Facial Swelling Reason for Call Symptomatic / Request for Health Information Initial Comment Caller states her face is swollen, difficulty swallowing , bad and infected tooth, wants antibiotics called in Translation No Nurse Assessment Nurse: Corpus, RN, Terri Date/Time (Eastern Time): 12/06/2023 9:16:30 AM Confirm and document reason for call. If symptomatic, describe symptoms. ---Caller reports an infected wisdom tooth on LT side. States she is having pain with swallowing with facial swelling x 3 days. Denies fever, endorses swollen lymph nodes - no other sx. Pt currently breasfeeding Does the patient have any Terri or worsening symptoms? ---Yes Will a triage be completed? ---Yes Related visit to physician within the last 2 weeks? ---No Does the PT have any chronic conditions? (i.e. diabetes, asthma, this includes High risk factors for pregnancy, etc.) ---Yes List chronic conditions. ---Type 2 Diabetes, gestational HTN - not pregnant at this time Is the patient pregnant or possibly pregnant? (Ask all females between the ages of 1-55) ---No Is this a behavioral health or substance abuse call? ---No Guidelines Guideline Title Affirmed Question Affirmed Notes Nurse Date/Time (Eastern Time) Toothache Face is very swollen Corpus, RN, Terri 12/06/2023 9:16:05 AM Disp.  Time Titus Time) Disposition Final User 12/06/2023 9:27:45 AM See HCP (or PCP Triage) Within 4 Hours Yes Corpus, RN, Terri Final Disposition 12/06/2023 9:27:45 AM See HCP (or PCP Triage) Within 4 Hours Yes Corpus, RN, Terri Flint Disagree/Comply Comply Caller Understands Yes PreDisposition Call Doctor Care Advice Given Per Guideline SEE HCP (OR PCP TRIAGE) WITHIN 4 HOURS: * IF OFFICE WILL BE CLOSED AND NO PCP (PRIMARY CARE PROVIDER) SECOND-LEVEL TRIAGE: You need to be seen within the next 3 or 4 hours. A nearby Urgent Care Center [facility]) is often a good source of care. Another choice is to go to the ED. Go sooner if you become worse. PAIN MEDICINES: * ACETAMINOPHEN  - REGULAR STRENGTH TYLENOL : Take 650 mg (two 325 mg pills) by mouth every 4 to 6 hours as needed. Each Regular Strength Tylenol  pill has 325 mg of acetaminophen . The most you should take is 10 pills a day (3,250 mg total). Note: In [place], the maximum is 12 pills a day (3,900 mg total). * IBUPROFEN  (SUCH AS MOTRIN , ADVIL ): Take 400 mg (two 200 mg pills) by mouth every 6 hours. The most you should take is 6 pills a day (1,200 mg total). USE A COLD PACK FOR FACE AND TOOTHACHE PAIN: CALL BACK IF: * You become worse CARE ADVICE given per Toothache (Adult) guideline. Comments User: Terri Mania, RN Date/Time Titus Time): 12/06/2023 9:23:14 AM Caller states pain 10/10 - has not taken any pain med User: Terri Mania, RN Date/Time Titus Time): 12/06/2023 9:28:12 AM Pt states that she called a dentist, but was advised to seek care at an ER d/t insurance concerns Referrals [facility]

## 2023-12-09 ENCOUNTER — Telehealth: Payer: Self-pay

## 2023-12-09 DIAGNOSIS — E119 Type 2 diabetes mellitus without complications: Secondary | ICD-10-CM

## 2023-12-09 MED ORDER — FREESTYLE LIBRE 3 PLUS SENSOR MISC: 0 refills | Status: DC

## 2023-12-09 NOTE — Telephone Encounter (Signed)
 Copied from CRM [id number]. Topic: Clinical - Medication Question >> Dec 09, 2023  1:50 PM Drema MATSU wrote: Reason for CRM: Patient has a question regarding her Herlene 3 freestyle sensor and she wants to know if she can come and pick up 1-2. She stated that she has to pay a copay at the pharmacy and is going through financial hardship right now.  Called pt and advised we would put 2 samples up at the front office for pick up. Pt advised will come by today to pick these up.

## 2023-12-15 DIAGNOSIS — R6889 Other general symptoms and signs: Secondary | ICD-10-CM | POA: Diagnosis not present

## 2023-12-18 ENCOUNTER — Ambulatory Visit: Payer: Self-pay | Admitting: Physical Therapy

## 2023-12-18 ENCOUNTER — Encounter: Payer: Self-pay | Admitting: Physical Therapy

## 2023-12-18 DIAGNOSIS — M25561 Pain in right knee: Secondary | ICD-10-CM | POA: Diagnosis not present

## 2023-12-18 DIAGNOSIS — M25571 Pain in right ankle and joints of right foot: Secondary | ICD-10-CM | POA: Diagnosis not present

## 2023-12-18 DIAGNOSIS — M6281 Muscle weakness (generalized): Secondary | ICD-10-CM | POA: Diagnosis not present

## 2023-12-18 DIAGNOSIS — G8929 Other chronic pain: Secondary | ICD-10-CM | POA: Diagnosis not present

## 2023-12-18 DIAGNOSIS — M5459 Other low back pain: Secondary | ICD-10-CM | POA: Insufficient documentation

## 2023-12-18 NOTE — Therapy (Signed)
 OUTPATIENT PHYSICAL THERAPY FEMALE PELVIC TREATMENT   Patient Name: Terri Ramirez MRN: [id number] DOB:05-Sep-1995, 28 y.o., female Today's Date: 12/18/2023  END OF SESSION:  PT End of Session - 12/18/23 1201     Visit Number 2    Date for Recertification  06/03/24    Authorization Type BCBS, [place] medicaid [employer] [place]    PT Start Time 1200    PT Stop Time 1235    PT Time Calculation (min) 35 min    Activity Tolerance Patient tolerated treatment well    Behavior During Therapy WFL for tasks assessed/performed          Past Medical History:  Diagnosis Date   Anxiety    Gestational diabetes    Low blood pressure    Low sodium levels    History reviewed. No pertinent surgical history. 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    PCP: Allwardt, Mardy HERO, PA-C  REFERRING PROVIDER: Verta Blossom, CNM  REFERRING DIAG: [phone] (ICD-10-CM) - Other specified disorders of muscle  THERAPY DIAG:  Other low back pain  Muscle weakness (generalized)  Chronic pain of right knee  Pain in right ankle and joints of right foot  Rationale for Evaluation and Treatment: Rehabilitation  ONSET DATE: 8 months ago- since birth 03/15/2023  SUBJECTIVE:                                                                                                                                                                                           SUBJECTIVE STATEMENT: Patient reports that she is tired, has had some ankle pain and right knee is popping She has done her exercises a few times, aware of breathing more at work Forgets to breathe when picking baby up  From eval Patient reports that  after she gave birth, her pelvic floor has been really really weak.  Cannot hold her pee, sometimes feels heavy, feels like her insides will fall out Feels like she never healed down there, a single mom, went to work 2 months PP  Fluid intake: drinks a lot of water  FUNCTIONAL LIMITATIONS: works nights, urinates often, single mom  PERTINENT HISTORY:  Medications for current condition: no Surgeries: no Other: no Sexual abuse: No  PAIN:  Are you having pain? No pelvic pain- really bad back pain since birth NPRS scale: 5/10 Pain location: back  Pain type: aching Pain description: constant   Aggravating factors: hurts constantly, might be how they co sleep Relieving factors: nothing  PRECAUTIONS: None  RED FLAGS: None   WEIGHT BEARING RESTRICTIONS: No  FALLS:  Has patient fallen in last 6 months? 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, lifts things, picks up boxes, walks, wears steel toe shoes  ACTIVITY LEVEL : active at work, walks her dog  PLOF: Independent  PATIENT GOALS: to put her pelvic floor where it was, feels like her muscles are so weak   BOWEL MOVEMENT: no issues URINATION: cannot hold her pee, sometimes feels heavy, feels like her insides will fall out Pain with urination: No Fully empty bladder: No                                         Post-void dribble: Yes a little Stream: Strong Urgency: Yes  Frequency:during the day yes                                                        Nocturia: no, gets up with the baby   Leakage: Urge to void Pads/briefs: No  INTERCOURSE: not active   PREGNANCY: Vaginal deliveries 1 Tearing Yes: on right side, baby was 8.5 lbs Episiotomy No C-section deliveries 0 Currently pregnant No  PROLAPSE: Heaviness and pressure   OBJECTIVE:  Note: Objective measures were completed at Evaluation unless otherwise noted.  DIAGNOSTIC FINDINGS:  Post-void residual: Voiding Cystourethrogram  (VCUG):  Ultrasound:   PATIENT SURVEYS:    PFIQ-7: 105 UIQ-7 33 CRAIG -7 45 POPIQ-7 43   COGNITION: Overall cognitive status: Within functional limits for tasks assessed     SENSATION: Light touch: Appears intact  LUMBAR SPECIAL TESTS:  Slump test: Negative  FUNCTIONAL TESTS:   Single leg stance: to be tested  Rt:  Lt: Sit-up test: difficult Squat: Bed mobility:breath holding  GAIT: Assistive device utilized: None Comments: WFL  POSTURE: rounded shoulders, forward head, and increased lumbar lordosis   LUMBARAROM/PROM: full  A/PROM A/PROM  Eval (% available)  Flexion   Extension   Right lateral flexion   Left lateral flexion   Right rotation   Left rotation    (Blank rows = not tested)  LOWER EXTREMITY MNF:qloo LOWER EXTREMITY MMT: bilateral hips 4-/5 PALPATION:    Pelvic Alignment: even  Abdominal:   Diastasis: Yes: 1 finger Distortion: Yes  - doming Breathing: breath holding strategies with lifting baby, #10 kettle bell and transfers  Scar tissue: Yes:  at vaginal opening Active Straight Leg Raise: NT                External Perineal Exam: mild dryness throughout, signs of decreased estrogen                             Internal Pelvic Floor: able to lift pelvic floor, stool in rectum, improved lift with exhale with theraband horizontal abduction and VC's  Patient confirms identification and approves PT to assess internal pelvic floor and treatment Yes All internal or external pelvic floor assessments and/or treatments are completed with proper hand hygiene and gloves hands. If needed gloves  are changed with hand hygiene during patient care time.  PELVIC MMT:   MMT eval  Vaginal 2/5  Internal Anal Sphincter   External Anal Sphincter   Puborectalis   (Blank rows = not tested)        TONE: average  PROLAPSE: Mild posterior vaginal wall laxity and stool in rectum, bladder descent with cough  TODAY'S TREATMENT:                                                                                                                               DATE:  12/18/2023 Nu Step for 10  minutes, level 2, bilateral upper and lower extremities, therapist present to discuss progress  Rowing with #10 kb with exhale 20 reps bilat Hip adduction with ball with thera band stretch with exhale 15 reps reps Rowing 3 plates with exhale cable 20 reps with exhale, VC's for long exhale, TC's for exhale, both arms, pelvic floor lift Palloff press 10 reps 3 plates bilat    12/05/23  EVAL  Examination completed, findings reviewed, pt educated on POC, HEP, and female pelvic floor anatomy, reasoning with pelvic floor assessment internally with pt consent. Pt motivated to participate in PT and agreeable to attempt recommendations.     PATIENT EDUCATION:  Education details: Pt was educated on relevant anatomy, exam findings, home exercise program, plan of care, expectations of PT and pressure management exercises with exhale  Person educated: Patient Education method: Explanation, Demonstration, Tactile cues, Verbal cues, and Handouts Education comprehension: verbalized understanding, returned demonstration, verbal cues required, tactile cues required, and needs further education  HOME EXERCISE PROGRAM: Access Code: [email or web address] URL: [email or web address] Date: 12/05/2023 Prepared by: Cori Imre Vecchione  Exercises - Diaphragmatic Breathing to Reduce Intra-abdominal Pressure: Lifting a Basket  - 1 x daily - 7 x weekly - 2 sets - 10 reps - Diaphragmatic Breathing to Reduce Intra-abdominal Pressure: Sit to Stand  - 1 x daily - 7 x weekly - 2 sets - 10 reps - Horizontal abd with TB with TRA breath  - 1 x daily - 7 x weekly - 2 sets - 10 reps  ASSESSMENT:  CLINICAL IMPRESSION: Patient with a little difficulty with exhale and coordination with her exercises, fatigued and needed recovery breaks. Discussed going back to the gym. Delayed engagement of  abdominals and bilateral knee as well as global weakness noticed. She will benefit from cont PT to strengthen postpartum      Patient is a 28 y.o. F who was seen today for physical therapy evaluation and treatment for pelvic pressure and urge incontinence. She also has significant low back pain. Patient reports that symptoms have been bothersome. Patient reports that she feels like her organs are going to fall out. Exam findings are notable for upper chest breathing strategies, abdominal weakness with doming, pelvic floor muscle weakness, average tone in pelvic floor. External soft tissues of pelvic floor appear dry with signs of decreased estrogen. Patient demonstrates fair  trunk mobility, bilateral hip weakness  and pain in her back. It is difficult for patient to lift boxes at work and baby due to weakness, she compensates with breath holding and increases pressure on her pelvic floor . Discussed findings with patient, educated patient on pressure management strategies, practices some in the clinic with thera band and baby and HEP was initiated. Patient's quality of life has been affected, patient is frustrated and will benefit from physical therapy to address deficits, reduce pelvic pressure, low back pain and urge incontinence and improve quality of life.    OBJECTIVE IMPAIRMENTS: decreased activity tolerance, decreased coordination, decreased endurance, decreased ROM, decreased strength, increased muscle spasms, impaired tone, and pain.   ACTIVITY LIMITATIONS: carrying, lifting, bending, standing, transfers, continence, toileting, and caring for others  PARTICIPATION LIMITATIONS: community activity and occupation  PERSONAL FACTORS: Time since onset of injury/illness/exacerbation are also affecting patient's functional outcome.   REHAB POTENTIAL: Good  CLINICAL DECISION MAKING: Evolving/moderate complexity  EVALUATION COMPLEXITY: Moderate   GOALS: Goals reviewed with patient?  Yes  SHORT TERM GOALS: Target date: 01/02/2024    Pt will be independent with HEP.   Baseline: Goal status: INITIAL  2.  Pt will be independent with use of squatty potty, relaxed toileting mechanics, and improved bowel movement techniques in order to increase ease of bowel movements and complete evacuation.   Baseline:  Goal status: INITIAL  3.   Pt will demonstrate appropriate lateral rib cage excursion with inhale to ensure better abdominal pressure management and pelvic floor/abdominal muscle relaxation without overusing upper traps in order to demonstrate better pressure management strategies with lifting at work and baby Baseline:  Goal status: INITIAL   LONG TERM GOALS: Target date: 06/04/2023  Pt will be independent with advanced HEP.   Baseline:  Goal status: INITIAL  2.  Pt will soak 0 pads/ day in order to run errands and not have to interrupt daily tasks.  Baseline:  Goal status: INITIAL  3.  Pt will have reduced low back pain to max 1/10 in order to be able to do functional activities such as bending, lifting, twisting and walking as needed to be able to take care of her family and participate in job duties.  Baseline:  Goal status: INITIAL  4.  Patient will be able to stand and walk at work for 12 hours without increased low back pain Baseline:  Goal status: INITIAL  5.  Patient will demonstrate bilateral hip pain 5/5 in order to increase lumbopelvic support Baseline:  Goal status: INITIAL    PLAN:  PT FREQUENCY: 1-2x/week  PT DURATION: 6 months  PLANNED INTERVENTIONS: 97110-Therapeutic exercises, 97530- Therapeutic activity, 97112- Neuromuscular re-education, 97535- Self Care, 02859- Manual therapy, [phone]- Electrical stimulation (manual), [phone]- Ionotophoresis 4mg /ml Dexamethasone, 79439 (1-2 muscles), 20561 (3+ muscles)- Dry Needling, Patient/Family education, Taping, Joint mobilization, Joint manipulation, Spinal manipulation, Spinal mobilization, Scar  mobilization, Cryotherapy, Moist heat, and Biofeedback  PLAN FOR NEXT SESSION: ortho- focus on hip and abdominal strengthening with exhale on exertion to reduce pressure on pelvic floor- nustep, leg press, back strengthening, VC's to manage pressures Pelvic- education on urge suppression, constipation strategies, core coordination    Dequincy Born, PT 12/18/2023, 12:03 PM

## 2023-12-23 DIAGNOSIS — F4312 Post-traumatic stress disorder, chronic: Secondary | ICD-10-CM | POA: Diagnosis not present

## 2023-12-24 ENCOUNTER — Ambulatory Visit: Admitting: Physical Therapy

## 2023-12-26 ENCOUNTER — Encounter: Payer: Self-pay | Admitting: Physical Therapy

## 2023-12-26 ENCOUNTER — Ambulatory Visit: Admitting: Physical Therapy

## 2023-12-26 DIAGNOSIS — G8929 Other chronic pain: Secondary | ICD-10-CM | POA: Diagnosis not present

## 2023-12-26 DIAGNOSIS — M6281 Muscle weakness (generalized): Secondary | ICD-10-CM

## 2023-12-26 DIAGNOSIS — M5459 Other low back pain: Secondary | ICD-10-CM

## 2023-12-26 DIAGNOSIS — M25561 Pain in right knee: Secondary | ICD-10-CM | POA: Diagnosis not present

## 2023-12-26 DIAGNOSIS — M25571 Pain in right ankle and joints of right foot: Secondary | ICD-10-CM

## 2023-12-26 NOTE — Therapy (Signed)
 OUTPATIENT PHYSICAL THERAPY FEMALE PELVIC TREATMENT   Patient Name: Terri Ramirez MRN: [id number] DOB:1995/08/22, 28 y.o., female Today's Date: 12/26/2023  END OF SESSION:  PT End of Session - 12/26/23 0956     Visit Number 3    Date for Recertification  06/03/24    Authorization Type [employer], [place] medicaid [employer] [place]    Authorization Time Period [employer] [place]- [place] Approved 4 vl-12/05/2023 - 02/02/2024,auth# [id number]    Authorization - Visit Number 2    Authorization - Number of Visits 4    PT Start Time [phone]    PT Stop Time 0930    PT Time Calculation (min) 41 min    Activity Tolerance Patient tolerated treatment well    Behavior During Therapy WFL for tasks assessed/performed           Past Medical History:  Diagnosis Date   Anxiety    Gestational diabetes    Low blood pressure    Low sodium levels    History reviewed. No pertinent surgical history. 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    PCP: Allwardt, Mardy HERO, PA-C  REFERRING PROVIDER: Verta Blossom, CNM  REFERRING DIAG: [phone] (ICD-10-CM) - Other specified disorders of muscle  THERAPY DIAG:  Other low back pain  Muscle weakness (generalized)  Chronic pain of right knee  Pain in right ankle and joints of right foot  Rationale for Evaluation and Treatment: Rehabilitation  ONSET DATE: 8 months ago- since birth 03/15/2023  SUBJECTIVE:                                                                                                                                                                                           SUBJECTIVE STATEMENT: Patient reports increased pressure in her pelvic  area. Back pain 7.5/10. She just got off work  From eval Patient reports that after she gave birth, her pelvic floor has been really really weak.  Cannot hold her pee, sometimes feels heavy, feels like her insides will fall out Feels like she never healed down there, a single mom, went to work 2 months PP  Fluid intake: drinks a lot of water  FUNCTIONAL LIMITATIONS: works nights, urinates often, single mom  PERTINENT HISTORY:  Medications for current condition: no Surgeries: no Other: no Sexual abuse: No  PAIN:  Are you having pain? No pelvic pain- really  bad back pain since birth NPRS scale: 5/10 Pain location: back  Pain type: aching Pain description: constant   Aggravating factors: hurts constantly, might be how they co sleep Relieving factors: nothing  PRECAUTIONS: None  RED FLAGS: None   WEIGHT BEARING RESTRICTIONS: No  FALLS:  Has patient fallen in last 6 months? 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, lifts things, picks up boxes, walks, wears steel toe shoes  ACTIVITY LEVEL : active at work, walks her dog  PLOF: Independent  PATIENT GOALS: to put her pelvic floor where it was, feels like her muscles are so weak   BOWEL MOVEMENT: no issues URINATION: cannot hold her pee, sometimes feels heavy, feels like her insides will fall out Pain with urination: No Fully empty bladder: No                                         Post-void dribble: Yes a little Stream: Strong Urgency: Yes  Frequency:during the day yes                                                        Nocturia: no, gets up with the baby   Leakage: Urge to void Pads/briefs: No  INTERCOURSE: not active   PREGNANCY: Vaginal deliveries 1 Tearing Yes: on right side, baby was 8.5 lbs Episiotomy No C-section deliveries 0 Currently pregnant No  PROLAPSE: Heaviness and pressure   OBJECTIVE:  Note: Objective measures were completed at Evaluation unless otherwise  noted.  DIAGNOSTIC FINDINGS:  Post-void residual: Voiding Cystourethrogram (VCUG):  Ultrasound:   PATIENT SURVEYS:    PFIQ-7: 105 UIQ-7 33 CRAIG -7 45 POPIQ-7 43   COGNITION: Overall cognitive status: Within functional limits for tasks assessed     SENSATION: Light touch: Appears intact  LUMBAR SPECIAL TESTS:  Slump test: Negative  FUNCTIONAL TESTS:   Single leg stance: to be tested  Rt:  Lt: Sit-up test: difficult Squat: Bed mobility:breath holding  GAIT: Assistive device utilized: None Comments: WFL  POSTURE: rounded shoulders, forward head, and increased lumbar lordosis   LUMBARAROM/PROM: full  A/PROM A/PROM  Eval (% available)  Flexion   Extension   Right lateral flexion   Left lateral flexion   Right rotation   Left rotation    (Blank rows = not tested)  LOWER EXTREMITY MNF:qloo LOWER EXTREMITY MMT: bilateral hips 4-/5 PALPATION:    Pelvic Alignment: even  Abdominal:   Diastasis: Yes: 1 finger Distortion: Yes  - doming Breathing: breath holding strategies with lifting baby, #10 kettle bell and transfers  Scar tissue: Yes:  at vaginal opening Active Straight Leg Raise: NT                External Perineal Exam: mild dryness throughout, signs of decreased estrogen                             Internal Pelvic Floor: able to lift pelvic floor, stool in rectum, improved lift with exhale with theraband horizontal abduction and VC's  Patient confirms identification and approves PT to assess internal pelvic floor and treatment Yes All internal or external pelvic floor assessments and/or treatments  are completed with proper hand hygiene and gloves hands. If needed gloves are changed with hand hygiene during patient care time.  PELVIC MMT:   MMT eval  Vaginal 2/5  Internal Anal Sphincter   External Anal Sphincter   Puborectalis   (Blank rows = not tested)        TONE: average  PROLAPSE: Mild posterior vaginal wall laxity and stool in  rectum, bladder descent with cough  TODAY'S TREATMENT:                                                                                                                              DATE:  12/26/2023 Update on status Review of urgency drill Hooklying feet supported on bolster pelvic tilt + pelvic floor contraction x 30 Hooklying TA activation + pelvic floor contraction + ball squeeze x20 Sit to stand +  TA activation + pelvic floor contraction x 10 Sit to stand +  TA activation + pelvic floor contraction  holding [place] (29lbs) x 10 Review and update of HEP    12/18/2023 Nu Step for 10  minutes, level 2, bilateral upper and lower extremities, therapist present to discuss progress  Rowing with #10 kb with exhale 20 reps bilat Hip adduction with ball with thera band stretch with exhale 15 reps reps Rowing 3 plates with exhale cable 20 reps with exhale, VC's for long exhale, TC's for exhale, both arms, pelvic floor lift Palloff press 10 reps 3 plates bilat    12/05/23  EVAL  Examination completed, findings reviewed, pt educated on POC, HEP, and female pelvic floor anatomy, reasoning with pelvic floor assessment internally with pt consent. Pt motivated to participate in PT and agreeable to attempt recommendations.     PATIENT EDUCATION:  Education details: Pt was educated on relevant anatomy, exam findings, home exercise program, plan of care, expectations of PT and pressure management exercises with exhale  Person educated: Patient Education method: Explanation, Demonstration, Tactile cues, Verbal cues, and Handouts Education comprehension: verbalized understanding, returned demonstration, verbal cues required, tactile cues required, and needs further education  HOME EXERCISE PROGRAM: Access Code: [email or web address] URL: [email or web address] Date: 12/26/2023 Prepared by: Kristeen Sar  Exercises - Diaphragmatic Breathing to Reduce Intra-abdominal Pressure: Lifting a Basket  - 1  x daily - 7 x weekly - 2 sets - 10 reps - Diaphragmatic Breathing to Reduce Intra-abdominal Pressure: Sit to Stand  - 1 x daily - 7 x weekly - 2 sets - 10 reps - Horizontal abd with TB with TRA breath  - 1 x daily - 7 x weekly - 2 sets - 10 reps - Pelvic Floor Muscle Contraction and Pelvic Tilt With Hips Elevated (for Pelvic Organ Prolapse)  - 1 x daily - 7 x weekly - 2 sets - 10 reps - Pelvic Floor Muscle Contraction and Adductor Squeeze With Hips Elevated (for Pelvic Organ Prolapse)  - 1 x daily - 7 x weekly - 2 sets - 10 reps -  Sit to Stand  - 1 x daily - 7 x weekly - 1-2 sets - 10 reps  ASSESSMENT:  CLINICAL IMPRESSION: Patient presents with complaints of increased pressure in her pelvis and instances of urinary urgency. Reviewed the urgency drill with patient and educated her on the importance of exhaling with exertion. She required max verbal cues for TA activation and exhale with exercises. She performed well using her hands as a tactile cue for TA activation. She verbalized fatigue with exercises. Reviewed updated HEP and provided urgency drill handout. Discussed the benefits of having a gym membership or buying free weights to continue exercises progressions. Patient will benefit from skilled PT to address the below impairments and improve overall function.       Patient is a 28 y.o. F who was seen today for physical therapy evaluation and treatment for pelvic pressure and urge incontinence. She also has significant low back pain. Patient reports that symptoms have been bothersome. Patient reports that she feels like her organs are going to fall out. Exam findings are notable for upper chest breathing strategies, abdominal weakness with doming, pelvic floor muscle weakness, average tone in pelvic floor. External soft tissues of pelvic floor appear dry with signs of decreased estrogen. Patient demonstrates fair trunk mobility, bilateral hip weakness  and pain in her back. It is difficult for  patient to lift boxes at work and baby due to weakness, she compensates with breath holding and increases pressure on her pelvic floor . Discussed findings with patient, educated patient on pressure management strategies, practices some in the clinic with thera band and baby and HEP was initiated. Patient's quality of life has been affected, patient is frustrated and will benefit from physical therapy to address deficits, reduce pelvic pressure, low back pain and urge incontinence and improve quality of life.    OBJECTIVE IMPAIRMENTS: decreased activity tolerance, decreased coordination, decreased endurance, decreased ROM, decreased strength, increased muscle spasms, impaired tone, and pain.   ACTIVITY LIMITATIONS: carrying, lifting, bending, standing, transfers, continence, toileting, and caring for others  PARTICIPATION LIMITATIONS: community activity and occupation  PERSONAL FACTORS: Time since onset of injury/illness/exacerbation are also affecting patient's functional outcome.   REHAB POTENTIAL: Good  CLINICAL DECISION MAKING: Evolving/moderate complexity  EVALUATION COMPLEXITY: Moderate   GOALS: Goals reviewed with patient? Yes  SHORT TERM GOALS: Target date: 01/02/2024    Pt will be independent with HEP.   Baseline: Goal status: INITIAL  2.  Pt will be independent with use of squatty potty, relaxed toileting mechanics, and improved bowel movement techniques in order to increase ease of bowel movements and complete evacuation.   Baseline:  Goal status: INITIAL  3.   Pt will demonstrate appropriate lateral rib cage excursion with inhale to ensure better abdominal pressure management and pelvic floor/abdominal muscle relaxation without overusing upper traps in order to demonstrate better pressure management strategies with lifting at work and baby Baseline:  Goal status: INITIAL   LONG TERM GOALS: Target date: 06/04/2023  Pt will be independent with advanced HEP.    Baseline:  Goal status: INITIAL  2.  Pt will soak 0 pads/ day in order to run errands and not have to interrupt daily tasks.  Baseline:  Goal status: INITIAL  3.  Pt will have reduced low back pain to max 1/10 in order to be able to do functional activities such as bending, lifting, twisting and walking as needed to be able to take care of her family and participate in job duties.  Baseline:  Goal status: INITIAL  4.  Patient will be able to stand and walk at work for 12 hours without increased low back pain Baseline:  Goal status: INITIAL  5.  Patient will demonstrate bilateral hip pain 5/5 in order to increase lumbopelvic support Baseline:  Goal status: INITIAL    PLAN:  PT FREQUENCY: 1-2x/week  PT DURATION: 6 months  PLANNED INTERVENTIONS: 97110-Therapeutic exercises, 97530- Therapeutic activity, 97112- Neuromuscular re-education, 97535- Self Care, 02859- Manual therapy, [phone]- Electrical stimulation (manual), [phone]- Ionotophoresis 4mg /ml Dexamethasone, 79439 (1-2 muscles), 20561 (3+ muscles)- Dry Needling, Patient/Family education, Taping, Joint mobilization, Joint manipulation, Spinal manipulation, Spinal mobilization, Scar mobilization, Cryotherapy, Moist heat, and Biofeedback  PLAN FOR NEXT SESSION:request more visits; assess urinary urgency & pelvic pressure ortho- focus on hip and abdominal strengthening with exhale on exertion to reduce pressure on pelvic floor- nustep, leg press, back strengthening, VC's to manage pressures Pelvic- education on urge suppression, constipation strategies, core coordination    Kristeen Sar, PT, DPT 12/26/2023 9:58 AM

## 2023-12-30 ENCOUNTER — Ambulatory Visit: Admitting: Physical Therapy

## 2023-12-30 ENCOUNTER — Telehealth: Payer: Self-pay | Admitting: Physical Therapy

## 2023-12-30 NOTE — Telephone Encounter (Signed)
 Left patient a voicemail about missed appointment. Reminded patient of next scheduled appointment and left a call back number.   Kristeen Sar, PT, DPT 12/30/2023 3:54 PM

## 2023-12-30 NOTE — Addendum Note (Signed)
 Addended by: Tayveon Lombardo on: 12/30/2023 01:22 PM   Modules accepted: Orders

## 2024-01-02 DIAGNOSIS — F4312 Post-traumatic stress disorder, chronic: Secondary | ICD-10-CM | POA: Diagnosis not present

## 2024-01-05 ENCOUNTER — Ambulatory Visit: Admitting: Physician Assistant

## 2024-01-05 VITALS — BP 120/70 | HR 89 | Temp 97.2°F | Ht 63.0 in | Wt 172.2 lb

## 2024-01-05 DIAGNOSIS — Z794 Long term (current) use of insulin: Secondary | ICD-10-CM | POA: Diagnosis not present

## 2024-01-05 DIAGNOSIS — E119 Type 2 diabetes mellitus without complications: Secondary | ICD-10-CM | POA: Diagnosis not present

## 2024-01-05 DIAGNOSIS — D509 Iron deficiency anemia, unspecified: Secondary | ICD-10-CM

## 2024-01-05 DIAGNOSIS — E1165 Type 2 diabetes mellitus with hyperglycemia: Secondary | ICD-10-CM | POA: Diagnosis not present

## 2024-01-05 LAB — COMPREHENSIVE METABOLIC PANEL WITH GFR
ALT: 20 U/L (ref 3–35)
AST: 16 U/L (ref 5–37)
Albumin: 4 g/dL (ref 3.5–5.2)
Alkaline Phosphatase: 126 U/L — ABNORMAL HIGH (ref 39–117)
BUN: 15 mg/dL (ref 6–23)
CO2: 26 meq/L (ref 19–32)
Calcium: 9.5 mg/dL (ref 8.4–10.5)
Chloride: 99 meq/L (ref 96–112)
Creatinine, Ser: 0.54 mg/dL (ref 0.40–1.20)
GFR: 126.52 mL/min
Glucose, Bld: 326 mg/dL — ABNORMAL HIGH (ref 70–99)
Potassium: 3.9 meq/L (ref 3.5–5.1)
Sodium: 133 meq/L — ABNORMAL LOW (ref 135–145)
Total Bilirubin: 0.6 mg/dL (ref 0.2–1.2)
Total Protein: 7 g/dL (ref 6.0–8.3)

## 2024-01-05 LAB — CBC WITH DIFFERENTIAL/PLATELET
Basophils Absolute: 0 K/uL (ref 0.0–0.1)
Basophils Relative: 0.6 % (ref 0.0–3.0)
Eosinophils Absolute: 0.1 K/uL (ref 0.0–0.7)
Eosinophils Relative: 1.9 % (ref 0.0–5.0)
HCT: 40.1 % (ref 36.0–46.0)
Hemoglobin: 13.6 g/dL (ref 12.0–15.0)
Lymphocytes Relative: 32.8 % (ref 12.0–46.0)
Lymphs Abs: 2.3 K/uL (ref 0.7–4.0)
MCHC: 34 g/dL (ref 30.0–36.0)
MCV: 90.5 fl (ref 78.0–100.0)
Monocytes Absolute: 0.4 K/uL (ref 0.1–1.0)
Monocytes Relative: 6.3 % (ref 3.0–12.0)
Neutro Abs: 4.1 K/uL (ref 1.4–7.7)
Neutrophils Relative %: 58.4 % (ref 43.0–77.0)
Platelets: 260 K/uL (ref 150.0–400.0)
RBC: 4.43 Mil/uL (ref 3.87–5.11)
RDW: 13 % (ref 11.5–15.5)
WBC: 7.1 K/uL (ref 4.0–10.5)

## 2024-01-05 LAB — IBC + FERRITIN
Ferritin: 27.2 ng/mL (ref 10.0–291.0)
Iron: 88 ug/dL (ref 42–145)
Saturation Ratios: 20.3 % (ref 20.0–50.0)
TIBC: 434 ug/dL (ref 250.0–450.0)
Transferrin: 310 mg/dL (ref 212.0–360.0)

## 2024-01-05 LAB — HEMOGLOBIN A1C: Hgb A1c MFr Bld: 10.4 % — ABNORMAL HIGH (ref 4.6–6.5)

## 2024-01-05 NOTE — Progress Notes (Signed)
 "   Patient ID: Terri Ramirez, female    DOB: 1995/03/27, 28 y.o.   MRN: [id number]   Assessment & Plan:  Uncontrolled type 2 diabetes mellitus with hyperglycemia (HCC) -     Ambulatory referral to Endocrinology  Type 2 diabetes mellitus with insulin  therapy (HCC) -     Hemoglobin A1c -     Comprehensive metabolic panel with GFR -     Ambulatory referral to Endocrinology  Iron deficiency anemia, unspecified iron deficiency anemia type -     IBC + Ferritin -     CBC with Differential/Platelet     Assessment & Plan Uncontrolled type 2 diabetes mellitus with hyperglycemia Persistent hyperglycemia with blood glucose levels ranging from 200s to 300s. Last A1c was 10.3 three months ago. Reports missing insulin  doses approximately five times over two weeks, contributing to elevated glucose levels. No significant dietary indiscretions reported. Experiencing polydipsia but not polyuria. No symptoms of diabetic ketoacidosis or neuropathy. Recent severe diarrhea episode resolved. Stress and irregular insulin  administration may contribute to poor glycemic control. Consideration for endocrinology referral due to postpartum onset and difficulty in glycemic management. - Checked A1c and electrolytes today. - Continue increasing insulin  dose by 2 units every three days until fasting glucose is under 120 mg/dL. - Referred to endocrinology for further management of diabetes. - Ensure consistent insulin  administration, ideally at the same time each day.  Iron deficiency anemia Reports symptoms suggestive of iron deficiency anemia, including joint weakness and bone cracking sensations. - Ordered iron studies to assess for iron deficiency anemia.      Return in about 3 months (around 04/04/2024) for recheck/follow-up.    Subjective:    Chief Complaint  Patient presents with   Diabetes    Taking medication as prescribed  Running high about 200-300    HPI Discussed the use of AI scribe  software for clinical note transcription with the patient, who gave verbal consent to proceed.  History of Present Illness Terri Ramirez is a 28 year old female with type 2 diabetes who presents for a follow-up visit on her diabetes management.  She has been managing her type 2 diabetes with insulin  after discontinuing metformin  due to significant diarrhea. She uses a Lantus  Solostar pen, administering 34 units, but sometimes forgets her doses, missing about five doses over two weeks. Her blood sugar levels have been consistently high, ranging from the high 200s to 300s, despite not eating poorly. She experiences increased thirst, consuming at least twelve bottles of water daily, but not urinating as much as expected given her fluid intake.  Three months ago, her A1c was 10.3. She experienced severe diarrhea for two and a half weeks about two weeks ago, initially thought to be a stomach bug. During this period, she was significantly dehydrated and noticed a decrease in her milk supply. The diarrhea has since resolved, and she has not taken metformin  since the last visit.  She reports occasional hypoglycemic episodes, particularly when she has not eaten all day, which she manages by eating a candy bar. Her blood sugar levels sometimes spike unexpectedly, even when she has not eaten much, such as after consuming a small amount of oatmeal.  She is experiencing peeling skin around her fingernails, which she attributes to not biting her nails recently. She occasionally wears gloves at work.  She has not been taking Lexapro  consistently after losing the pills and describes her mood as having more good days than bad, though she feels 'numb' and sometimes  experiences sadness. She has not noticed any significant changes in her mood without the medication.  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 She is also breastfeeding.     Past Medical History:  Diagnosis Date   Anxiety    Gestational diabetes    Low blood pressure    Low sodium levels     No past surgical history on file.  Family History  Adopted: Yes  Family history unknown: Yes    Social History[1]   Allergies[2]  Review of Systems NEGATIVE UNLESS OTHERWISE INDICATED IN HPI      Objective:     BP 120/70   Pulse 89   Temp (!) 97.2 F (36.2 C) (Temporal)   Ht 5' 3 (1.6 m)   Wt 172 lb 3.2 oz (78.1 kg)   SpO2 98%   BMI 30.50 kg/m   Wt Readings from Last 3 Encounters:  01/05/24 172 lb 3.2 oz (78.1 kg)  11/25/23 178 lb (80.7 kg)  11/21/23 177 lb 6.4 oz (80.5 kg)    BP Readings from Last 3 Encounters:  01/05/24 120/70  11/25/23 108/74  11/21/23 110/68     Physical Exam Constitutional:      Appearance: Normal appearance. She is obese.  Eyes:     Extraocular Movements: Extraocular movements intact.     Conjunctiva/sclera: Conjunctivae normal.     Pupils: Pupils are equal, round, and reactive to light.  Cardiovascular:     Rate and Rhythm: Normal rate and regular rhythm.     Pulses: Normal pulses.  Pulmonary:     Effort: Pulmonary effort is normal.  Skin:    Findings: No erythema, lesion or rash.  Neurological:     Mental Status: She is alert.     Sensory: No sensory deficit.  Psychiatric:        Mood and Affect: Mood normal.        Behavior: Behavior normal.             Makala Fetterolf M Jo-Anne Kluth, PA-C     [1]  Social History Tobacco Use   Smoking status: Never   Smokeless tobacco: Never  Vaping Use   Vaping status: Never Used  Substance Use Topics   Alcohol use: Not Currently   Drug use: Not Currently    Types: Marijuana    Comment: none during preg  [2] No Known Allergies  "

## 2024-01-06 ENCOUNTER — Ambulatory Visit: Admitting: Physical Therapy

## 2024-01-07 ENCOUNTER — Ambulatory Visit: Payer: Self-pay | Admitting: Physician Assistant

## 2024-01-07 ENCOUNTER — Ambulatory Visit: Admitting: Physical Therapy

## 2024-01-07 NOTE — Patient Instructions (Signed)
" °  VISIT SUMMARY: You came in for a follow-up visit to manage your type 2 diabetes. We discussed your current insulin  regimen, blood sugar levels, and recent health issues, including a severe diarrhea episode and symptoms of iron deficiency anemia.  YOUR PLAN: UNCONTROLLED TYPE 2 DIABETES MELLITUS WITH HYPERGLYCEMIA: Your blood sugar levels have been consistently high, and you have missed some insulin  doses. Your last A1c was 10.3 three months ago. -We checked your A1c and electrolytes today. -Increase your insulin  dose by 2 units every three days until your fasting glucose is under 120 mg/dL. -You have been referred to an endocrinologist for further management of your diabetes. -Make sure to take your insulin  consistently, ideally at the same time each day.  IRON DEFICIENCY ANEMIA: You have symptoms that suggest iron deficiency anemia, such as joint weakness and bone cracking sensations. -We ordered iron studies to check for iron deficiency anemia.                      Contains text generated by Abridge.                                 Contains text generated by Abridge.   "

## 2024-01-13 ENCOUNTER — Encounter: Payer: Self-pay | Admitting: Physical Therapy

## 2024-01-13 ENCOUNTER — Ambulatory Visit: Admitting: Physical Therapy

## 2024-01-13 DIAGNOSIS — G8929 Other chronic pain: Secondary | ICD-10-CM

## 2024-01-13 DIAGNOSIS — M25571 Pain in right ankle and joints of right foot: Secondary | ICD-10-CM

## 2024-01-13 DIAGNOSIS — M6281 Muscle weakness (generalized): Secondary | ICD-10-CM | POA: Diagnosis not present

## 2024-01-13 DIAGNOSIS — M25561 Pain in right knee: Secondary | ICD-10-CM | POA: Diagnosis not present

## 2024-01-13 DIAGNOSIS — M5459 Other low back pain: Secondary | ICD-10-CM

## 2024-01-13 NOTE — Therapy (Signed)
 " OUTPATIENT PHYSICAL THERAPY FEMALE PELVIC TREATMENT   Patient Name: Terri Ramirez MRN: [id number] DOB:Jun 23, 1995, 28 y.o., female Today's Date: 01/13/2024  END OF SESSION:  PT End of Session - 01/13/24 1317     Visit Number 4    Date for Recertification  06/03/24    Authorization Type [employer], [place] [place] (more requested 12/30)    Authorization Time Period [employer] [place] Approved 4 vl-12/05/2023 - 02/02/2024,auth# [id number]    Authorization - Visit Number 3    Authorization - Number of Visits 4    PT Start Time 1156   arrival time   PT Stop Time 1234    PT Time Calculation (min) 38 min    Activity Tolerance Patient tolerated treatment well    Behavior During Therapy WFL for tasks assessed/performed            Past Medical History:  Diagnosis Date   Anxiety    Gestational diabetes    Low blood pressure    Low sodium levels    History reviewed. No pertinent surgical history. 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    PCP: Allwardt, Mardy HERO, PA-C  REFERRING PROVIDER: Verta Blossom, CNM  REFERRING DIAG: [phone] (ICD-10-CM) - Other specified disorders of muscle  THERAPY DIAG:  Other low back pain  Muscle weakness (generalized)  Chronic pain of right knee  Pain in right ankle and joints of right foot  Rationale for Evaluation and Treatment: Rehabilitation  ONSET DATE: 8 months ago- since birth 03/15/2023  SUBJECTIVE:                                                                                                                                                                                           SUBJECTIVE STATEMENT: Patient  reports no pain currently. She had some sore after last treatment session. She still had some pressure and she is able hold her urine a few more steps. She has not been at work for a week so back pain has been lower.  From eval Patient reports that after she gave birth, her pelvic floor has been really really weak.  Cannot hold her pee, sometimes feels heavy, feels like her insides will fall out Feels like she never healed down there, a single mom, went to work 2 months PP  Fluid intake: drinks a lot of water  FUNCTIONAL LIMITATIONS: works nights, urinates often, single mom  PERTINENT HISTORY:  Medications for current condition: no Surgeries: no Other: no Sexual abuse: No  PAIN:  Are you having pain? No pelvic pain- really bad back pain since birth NPRS scale: 5/10 Pain location: back  Pain type: aching Pain description: constant   Aggravating factors: hurts constantly, might be how they co sleep Relieving factors: nothing  PRECAUTIONS: None  RED FLAGS: None   WEIGHT BEARING RESTRICTIONS: No  FALLS:  Has patient fallen in last 6 months? 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, lifts things, picks up boxes, walks, wears steel toe shoes  ACTIVITY LEVEL : active at work, walks her dog  PLOF: Independent  PATIENT GOALS: to put her pelvic floor where it was, feels like her muscles are so weak   BOWEL MOVEMENT: no issues URINATION: cannot hold her pee, sometimes feels heavy, feels like her insides will fall out Pain with urination: No Fully empty bladder: No                                         Post-void dribble: Yes a little Stream: Strong Urgency: Yes  Frequency:during the day yes                                                        Nocturia: no, gets up with the baby   Leakage: Urge to void Pads/briefs: No  INTERCOURSE: not active   PREGNANCY: Vaginal deliveries 1 Tearing Yes: on right side, baby was 8.5 lbs Episiotomy  No C-section deliveries 0 Currently pregnant No  PROLAPSE: Heaviness and pressure   OBJECTIVE:  Note: Objective measures were completed at Evaluation unless otherwise noted.  DIAGNOSTIC FINDINGS:  Post-void residual: Voiding Cystourethrogram (VCUG):  Ultrasound:   PATIENT SURVEYS:    PFIQ-7: 105 UIQ-7 33 CRAIG -7 45 POPIQ-7 43   01/13/2024 PFIQ-7: 71 UIQ-7 24 CRAIG -7 24 POPIQ-7 24  COGNITION: Overall cognitive status: Within functional limits for tasks assessed     SENSATION: Light touch: Appears intact  LUMBAR SPECIAL TESTS:  Slump test: Negative  FUNCTIONAL TESTS:   Single leg stance: to be tested  Rt:  Lt: Sit-up test: difficult Squat: Bed mobility:breath holding  GAIT: Assistive device utilized: None Comments: WFL  POSTURE: rounded shoulders, forward head, and increased lumbar lordosis   LUMBARAROM/PROM: full  A/PROM A/PROM  Eval (% available)  Flexion   Extension   Right lateral flexion   Left lateral flexion   Right rotation   Left rotation    (Blank rows = not tested)  LOWER EXTREMITY MNF:qloo LOWER EXTREMITY MMT: bilateral hips 4-/5 PALPATION:    Pelvic Alignment: even  Abdominal:   Diastasis: Yes: 1 finger Distortion: Yes  - doming Breathing: breath holding strategies with lifting baby, #10 kettle bell and transfers  Scar tissue: Yes:  at vaginal opening Active Straight Leg Raise: NT                External Perineal Exam: mild dryness throughout, signs of decreased estrogen  Internal Pelvic Floor: able to lift pelvic floor, stool in rectum, improved lift with exhale with theraband horizontal abduction and VC's  Patient confirms identification and approves PT to assess internal pelvic floor and treatment Yes All internal or external pelvic floor assessments and/or treatments are completed with proper hand hygiene and gloves hands. If needed gloves are changed with hand hygiene during patient  care time.  PELVIC MMT:   MMT eval  Vaginal 2/5  Internal Anal Sphincter   External Anal Sphincter   Puborectalis   (Blank rows = not tested)        TONE: average  PROLAPSE: Mild posterior vaginal wall laxity and stool in rectum, bladder descent with cough  TODAY'S TREATMENT:                                                                                                                              DATE:  01/13/2024 Update on status PFIQ -7 & goal assessment Hooklying alt hand and knee press with purple ball x 10 bilateral  Supine bridge with purple ball squeeze 2 x 10 Hooklying TA activation + hip abduction with red loop 2 x 10 Hooklying TA activation + march with red loop x 20 total Sit to stand +  TA activation + pelvic floor contraction x 10    12/26/2023 Update on status Review of urgency drill Hooklying feet supported on bolster pelvic tilt + pelvic floor contraction x 30 Hooklying TA activation + pelvic floor contraction + ball squeeze x20 Sit to stand +  TA activation + pelvic floor contraction x 10 Sit to stand +  TA activation + pelvic floor contraction  holding [place] (29lbs) x 10 Review and update of HEP    12/18/2023 Nu Step for 10  minutes, level 2, bilateral upper and lower extremities, therapist present to discuss progress  Rowing with #10 kb with exhale 20 reps bilat Hip adduction with ball with thera band stretch with exhale 15 reps reps Rowing 3 plates with exhale cable 20 reps with exhale, VC's for long exhale, TC's for exhale, both arms, pelvic floor lift Palloff press 10 reps 3 plates bilat    12/05/23  EVAL  Examination completed, findings reviewed, pt educated on POC, HEP, and female pelvic floor anatomy, reasoning with pelvic floor assessment internally with pt consent. Pt motivated to participate in PT and agreeable to attempt recommendations.     PATIENT EDUCATION:  Education details: Pt was educated on relevant anatomy, exam findings,  home exercise program, plan of care, expectations of PT and pressure management exercises with exhale  Person educated: Patient Education method: Explanation, Demonstration, Tactile cues, Verbal cues, and Handouts Education comprehension: verbalized understanding, returned demonstration, verbal cues required, tactile cues required, and needs further education  HOME EXERCISE PROGRAM: Access Code: [email or web address] URL: [email or web address] Date: 12/26/2023 Prepared by: Kristeen Sar  Exercises - Diaphragmatic Breathing to Reduce Intra-abdominal Pressure: Lifting a Basket  - 1 x daily - 7 x  weekly - 2 sets - 10 reps - Diaphragmatic Breathing to Reduce Intra-abdominal Pressure: Sit to Stand  - 1 x daily - 7 x weekly - 2 sets - 10 reps - Horizontal abd with TB with TRA breath  - 1 x daily - 7 x weekly - 2 sets - 10 reps - Pelvic Floor Muscle Contraction and Pelvic Tilt With Hips Elevated (for Pelvic Organ Prolapse)  - 1 x daily - 7 x weekly - 2 sets - 10 reps - Pelvic Floor Muscle Contraction and Adductor Squeeze With Hips Elevated (for Pelvic Organ Prolapse)  - 1 x daily - 7 x weekly - 2 sets - 10 reps - Sit to Stand  - 1 x daily - 7 x weekly - 1-2 sets - 10 reps  ASSESSMENT:  CLINICAL IMPRESSION: Anthonella presents with no current pain. She verbalized semi compliance to HEP. She still verbalizes increased pressure in her pelvic region, but has noticed improvements with incontinence. She feels she is still holding her breath when doing activities. Educated patient on the importance of exhaling with exertion for pressure management. Enforced this today during treatment session. Noted improvements on PFIQ- 7 since evaluation. Overall, she tolerated treatment session well and did not have any increased pain or discomfort. She verbalized muscle fatigue.  Patient demonstrates good rehab potential to achieve stated goals through skilled therapy intervention.         Patient is a 28 y.o. F who was  seen today for physical therapy evaluation and treatment for pelvic pressure and urge incontinence. She also has significant low back pain. Patient reports that symptoms have been bothersome. Patient reports that she feels like her organs are going to fall out. Exam findings are notable for upper chest breathing strategies, abdominal weakness with doming, pelvic floor muscle weakness, average tone in pelvic floor. External soft tissues of pelvic floor appear dry with signs of decreased estrogen. Patient demonstrates fair trunk mobility, bilateral hip weakness  and pain in her back. It is difficult for patient to lift boxes at work and baby due to weakness, she compensates with breath holding and increases pressure on her pelvic floor . Discussed findings with patient, educated patient on pressure management strategies, practices some in the clinic with thera band and baby and HEP was initiated. Patient's quality of life has been affected, patient is frustrated and will benefit from physical therapy to address deficits, reduce pelvic pressure, low back pain and urge incontinence and improve quality of life.    OBJECTIVE IMPAIRMENTS: decreased activity tolerance, decreased coordination, decreased endurance, decreased ROM, decreased strength, increased muscle spasms, impaired tone, and pain.   ACTIVITY LIMITATIONS: carrying, lifting, bending, standing, transfers, continence, toileting, and caring for others  PARTICIPATION LIMITATIONS: community activity and occupation  PERSONAL FACTORS: Time since onset of injury/illness/exacerbation are also affecting patient's functional outcome.   REHAB POTENTIAL: Good  CLINICAL DECISION MAKING: Evolving/moderate complexity  EVALUATION COMPLEXITY: Moderate   GOALS: Goals reviewed with patient? Yes  SHORT TERM GOALS: Target date: 01/02/2024    Pt will be independent with HEP.   Baseline: Goal status: MET 01/13/2024  2.  Pt will be independent with use of  squatty potty, relaxed toileting mechanics, and improved bowel movement techniques in order to increase ease of bowel movements and complete evacuation.   Baseline:  Goal status: IN PROGRESS 01/13/2024  3.   Pt will demonstrate appropriate lateral rib cage excursion with inhale to ensure better abdominal pressure management and pelvic floor/abdominal muscle relaxation without overusing  upper traps in order to demonstrate better pressure management strategies with lifting at work and baby Baseline:  Goal status: IN PROGRESS (still needs curing on breathing technique) 01/13/2024   LONG TERM GOALS: Target date: 06/04/2023  Pt will be independent with advanced HEP.   Baseline:  Goal status: IN PROGRESS 01/13/2024  2.  Pt will soak 0 pads/ day in order to run errands and not have to interrupt daily tasks.  Baseline:  Goal status: MET 01/13/2024  3.  Pt will have reduced low back pain to max 1/10 in order to be able to do functional activities such as bending, lifting, twisting and walking as needed to be able to take care of her family and participate in job duties.  Baseline:  Goal status: IN PROGRESS 01/13/2024  4.  Patient will be able to stand and walk at work for 12 hours without increased low back pain Baseline:  Goal status: IN PROGRESS (pain starts around 3 hours) 01/13/2024  5.  Patient will demonstrate bilateral hip pain 5/5 in order to increase lumbopelvic support Baseline:  Goal status:IN PROGRESS 01/13/2024    PLAN:  PT FREQUENCY: 1-2x/week  PT DURATION: 6 months  PLANNED INTERVENTIONS: 97110-Therapeutic exercises, 97530- Therapeutic activity, 97112- Neuromuscular re-education, 97535- Self Care, [id number]- Manual therapy, [phone]- Electrical stimulation (manual), [phone]- Ionotophoresis 4mg /ml Dexamethasone, 79439 (1-2 muscles), 20561 (3+ muscles)- Dry Needling, Patient/Family education, Taping, Joint mobilization, Joint manipulation, Spinal manipulation, Spinal mobilization,  Scar mobilization, Cryotherapy, Moist heat, and Biofeedback  PLAN FOR NEXT SESSION assess urinary urgency & pelvic pressure; HEP compliance ortho- focus on hip and abdominal strengthening with exhale on exertion to reduce pressure on pelvic floor- nustep, back strengthening, VC's to manage pressures Pelvic- education on urge suppression, constipation strategies, core coordination    Kristeen Sar, PT, DPT 01/13/2024 1:19 PM    "

## 2024-01-19 ENCOUNTER — Encounter: Payer: Self-pay | Admitting: Physical Therapy

## 2024-01-19 ENCOUNTER — Ambulatory Visit: Attending: Obstetrics and Gynecology | Admitting: Physical Therapy

## 2024-01-19 ENCOUNTER — Ambulatory Visit: Admitting: Physical Therapy

## 2024-01-19 DIAGNOSIS — M25561 Pain in right knee: Secondary | ICD-10-CM | POA: Insufficient documentation

## 2024-01-19 DIAGNOSIS — R293 Abnormal posture: Secondary | ICD-10-CM | POA: Insufficient documentation

## 2024-01-19 DIAGNOSIS — M6281 Muscle weakness (generalized): Secondary | ICD-10-CM | POA: Insufficient documentation

## 2024-01-19 DIAGNOSIS — M5459 Other low back pain: Secondary | ICD-10-CM | POA: Diagnosis present

## 2024-01-19 DIAGNOSIS — G8929 Other chronic pain: Secondary | ICD-10-CM | POA: Diagnosis present

## 2024-01-19 DIAGNOSIS — R102 Pelvic and perineal pain unspecified side: Secondary | ICD-10-CM | POA: Insufficient documentation

## 2024-01-19 DIAGNOSIS — R279 Unspecified lack of coordination: Secondary | ICD-10-CM | POA: Diagnosis present

## 2024-01-19 DIAGNOSIS — M25571 Pain in right ankle and joints of right foot: Secondary | ICD-10-CM | POA: Diagnosis present

## 2024-01-19 NOTE — Therapy (Signed)
 " OUTPATIENT PHYSICAL THERAPY FEMALE PELVIC TREATMENT   Patient Name: Yanice Maqueda MRN: [id number] DOB:08/30/1995, 29 y.o., female Today's Date: 01/19/2024  END OF SESSION:  PT End of Session - 01/19/24 1248     Visit Number 5    Date for Recertification  06/03/24    Authorization Type [employer]BS, [place] medicaid [employer] [place] (more requested 12/30)    Authorization Time Period [employer] [place] Approved 4 vl-12/05/2023 - 02/02/2024,auth# [id number]    Authorization - Visit Number 4    Authorization - Number of Visits 4    PT Start Time 1119   arrival time   PT Stop Time 1149    PT Time Calculation (min) 30 min    Activity Tolerance Patient tolerated treatment well    Behavior During Therapy WFL for tasks assessed/performed             Past Medical History:  Diagnosis Date   Anxiety    Gestational diabetes    Low blood pressure    Low sodium levels    History reviewed. No pertinent surgical history. 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    PCP: Allwardt, Mardy HERO, PA-C  REFERRING PROVIDER: Verta Blossom, CNM  REFERRING DIAG: [phone] (ICD-10-CM) - Other specified disorders of muscle  THERAPY DIAG:  Other low back pain  Muscle weakness (generalized)  Chronic pain of right knee  Pain in right ankle and joints of right foot  Rationale for Evaluation and Treatment: Rehabilitation  ONSET DATE: 8 months ago- since birth 03/15/2023  SUBJECTIVE:                                                                                                                                                                                           SUBJECTIVE STATEMENT: Patient  reports she is doing good today. She is not currently having any pain. She has been doing her Hooklying relaxation with feet elevated.  From eval Patient reports that after she gave birth, her pelvic floor has been really really weak.  Cannot hold her pee, sometimes feels heavy, feels like her insides will fall out Feels like she never healed down there, a single mom, went to work 2 months PP  Fluid intake: drinks a lot of water  FUNCTIONAL LIMITATIONS: works nights, urinates often, single mom  PERTINENT HISTORY:  Medications for current condition: no Surgeries:  no Other: no Sexual abuse: No  PAIN:  Are you having pain? No pelvic pain- really bad back pain since birth NPRS scale: 5/10 Pain location: back  Pain type: aching Pain description: constant   Aggravating factors: hurts constantly, might be how they co sleep Relieving factors: nothing  PRECAUTIONS: None  RED FLAGS: None   WEIGHT BEARING RESTRICTIONS: No  FALLS:  Has patient fallen in last 6 months? 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, lifts things, picks up boxes, walks, wears steel toe shoes  ACTIVITY LEVEL : active at work, walks her dog  PLOF: Independent  PATIENT GOALS: to put her pelvic floor where it was, feels like her muscles are so weak   BOWEL MOVEMENT: no issues URINATION: cannot hold her pee, sometimes feels heavy, feels like her insides will fall out Pain with urination: No Fully empty bladder: No                                         Post-void dribble: Yes a little Stream: Strong Urgency: Yes  Frequency:during the day yes                                                        Nocturia: no, gets up with the baby   Leakage: Urge to void Pads/briefs: No  INTERCOURSE: not active   PREGNANCY: Vaginal deliveries 1 Tearing Yes: on right side, baby was 8.5 lbs Episiotomy No C-section deliveries 0 Currently pregnant No  PROLAPSE: Heaviness and  pressure   OBJECTIVE:  Note: Objective measures were completed at Evaluation unless otherwise noted.  DIAGNOSTIC FINDINGS:  Post-void residual: Voiding Cystourethrogram (VCUG):  Ultrasound:   PATIENT SURVEYS:    PFIQ-7: 105 UIQ-7 33 CRAIG -7 45 POPIQ-7 43   01/13/2024 PFIQ-7: 71 UIQ-7 24 CRAIG -7 24 POPIQ-7 24  COGNITION: Overall cognitive status: Within functional limits for tasks assessed     SENSATION: Light touch: Appears intact  LUMBAR SPECIAL TESTS:  Slump test: Negative  FUNCTIONAL TESTS:   Single leg stance: to be tested  Rt:  Lt: Sit-up test: difficult Squat: Bed mobility:breath holding  GAIT: Assistive device utilized: None Comments: WFL  POSTURE: rounded shoulders, forward head, and increased lumbar lordosis   LUMBARAROM/PROM: full  A/PROM A/PROM  Eval (% available)  Flexion   Extension   Right lateral flexion   Left lateral flexion   Right rotation   Left rotation    (Blank rows = not tested)  LOWER EXTREMITY MNF:qloo LOWER EXTREMITY MMT: bilateral hips 4-/5 PALPATION:    Pelvic Alignment: even  Abdominal:   Diastasis: Yes: 1 finger Distortion: Yes  - doming Breathing: breath holding strategies with lifting baby, #10 kettle bell and transfers  Scar tissue: Yes:  at vaginal opening Active Straight Leg Raise: NT                External Perineal Exam: mild dryness throughout, signs of decreased estrogen                             Internal Pelvic Floor: able to lift pelvic floor, stool in rectum, improved lift with exhale with theraband  horizontal abduction and VC's  Patient confirms identification and approves PT to assess internal pelvic floor and treatment Yes All internal or external pelvic floor assessments and/or treatments are completed with proper hand hygiene and gloves hands. If needed gloves are changed with hand hygiene during patient care time.  PELVIC MMT:   MMT eval  Vaginal 2/5  Internal Anal Sphincter    External Anal Sphincter   Puborectalis   (Blank rows = not tested)        TONE: average  PROLAPSE: Mild posterior vaginal wall laxity and stool in rectum, bladder descent with cough  TODAY'S TREATMENT:                                                                                                                              DATE:  01/19/2024 Patient was late to appointment Update on status Sidelying clamshell with red loop 2 x 10 bilateral (patient had increased calf pain so decreased to yellow but she felt it was still challenging so took away band)  Supine bridge 2 x 10 Hooklying march with TA activation x 20 total    01/13/2024 Update on status PFIQ -7 & goal assessment Hooklying alt hand and knee press with purple ball x 10 bilateral  Supine bridge with purple ball squeeze 2 x 10 Hooklying TA activation + hip abduction with red loop 2 x 10 Hooklying TA activation + march with red loop x 20 total Sit to stand +  TA activation + pelvic floor contraction x 10    12/26/2023 Update on status Review of urgency drill Hooklying feet supported on bolster pelvic tilt + pelvic floor contraction x 30 Hooklying TA activation + pelvic floor contraction + ball squeeze x20 Sit to stand +  TA activation + pelvic floor contraction x 10 Sit to stand +  TA activation + pelvic floor contraction  holding [place] (29lbs) x 10 Review and update of HEP    12/18/2023 Nu Step for 10  minutes, level 2, bilateral upper and lower extremities, therapist present to discuss progress  Rowing with #10 kb with exhale 20 reps bilat Hip adduction with ball with thera band stretch with exhale 15 reps reps Rowing 3 plates with exhale cable 20 reps with exhale, VC's for long exhale, TC's for exhale, both arms, pelvic floor lift Palloff press 10 reps 3 plates bilat    12/05/23  EVAL  Examination completed, findings reviewed, pt educated on POC, HEP, and female pelvic floor anatomy, reasoning with  pelvic floor assessment internally with pt consent. Pt motivated to participate in PT and agreeable to attempt recommendations.     PATIENT EDUCATION:  Education details: Pt was educated on relevant anatomy, exam findings, home exercise program, plan of care, expectations of PT and pressure management exercises with exhale  Person educated: Patient Education method: Explanation, Demonstration, Tactile cues, Verbal cues, and Handouts Education comprehension: verbalized understanding, returned demonstration, verbal cues required, tactile cues required, and  needs further education  HOME EXERCISE PROGRAM: Access Code: [email or web address] URL: [email or web address] Date: 12/26/2023 Prepared by: Kristeen Sar  Exercises - Diaphragmatic Breathing to Reduce Intra-abdominal Pressure: Lifting a Basket  - 1 x daily - 7 x weekly - 2 sets - 10 reps - Diaphragmatic Breathing to Reduce Intra-abdominal Pressure: Sit to Stand  - 1 x daily - 7 x weekly - 2 sets - 10 reps - Horizontal abd with TB with TRA breath  - 1 x daily - 7 x weekly - 2 sets - 10 reps - Pelvic Floor Muscle Contraction and Pelvic Tilt With Hips Elevated (for Pelvic Organ Prolapse)  - 1 x daily - 7 x weekly - 2 sets - 10 reps - Pelvic Floor Muscle Contraction and Adductor Squeeze With Hips Elevated (for Pelvic Organ Prolapse)  - 1 x daily - 7 x weekly - 2 sets - 10 reps - Sit to Stand  - 1 x daily - 7 x weekly - 1-2 sets - 10 reps  ASSESSMENT:  CLINICAL IMPRESSION: Treatment session limited due to patient's arrival time. Kyndahl presents with no current pain. She has been more compliant with HEP and incorporating more of the hook lying exercises with her feet elevated. Coordinating her exhale with exertion is still challenging for her. She was very challenged with resisted clamshells especially on right, so discontinued using a resistance band. She verbalized fatigue at end of session. PT monitored patient response and provided tactile cues  as needed.       Patient is a 29 y.o. F who was seen today for physical therapy evaluation and treatment for pelvic pressure and urge incontinence. She also has significant low back pain. Patient reports that symptoms have been bothersome. Patient reports that she feels like her organs are going to fall out. Exam findings are notable for upper chest breathing strategies, abdominal weakness with doming, pelvic floor muscle weakness, average tone in pelvic floor. External soft tissues of pelvic floor appear dry with signs of decreased estrogen. Patient demonstrates fair trunk mobility, bilateral hip weakness  and pain in her back. It is difficult for patient to lift boxes at work and baby due to weakness, she compensates with breath holding and increases pressure on her pelvic floor . Discussed findings with patient, educated patient on pressure management strategies, practices some in the clinic with thera band and baby and HEP was initiated. Patient's quality of life has been affected, patient is frustrated and will benefit from physical therapy to address deficits, reduce pelvic pressure, low back pain and urge incontinence and improve quality of life.    OBJECTIVE IMPAIRMENTS: decreased activity tolerance, decreased coordination, decreased endurance, decreased ROM, decreased strength, increased muscle spasms, impaired tone, and pain.   ACTIVITY LIMITATIONS: carrying, lifting, bending, standing, transfers, continence, toileting, and caring for others  PARTICIPATION LIMITATIONS: community activity and occupation  PERSONAL FACTORS: Time since onset of injury/illness/exacerbation are also affecting patient's functional outcome.   REHAB POTENTIAL: Good  CLINICAL DECISION MAKING: Evolving/moderate complexity  EVALUATION COMPLEXITY: Moderate   GOALS: Goals reviewed with patient? Yes  SHORT TERM GOALS: Target date: 01/02/2024    Pt will be independent with HEP.   Baseline: Goal status: MET  01/13/2024  2.  Pt will be independent with use of squatty potty, relaxed toileting mechanics, and improved bowel movement techniques in order to increase ease of bowel movements and complete evacuation.   Baseline:  Goal status: IN PROGRESS 01/13/2024  3.   Pt will demonstrate appropriate lateral rib cage  excursion with inhale to ensure better abdominal pressure management and pelvic floor/abdominal muscle relaxation without overusing upper traps in order to demonstrate better pressure management strategies with lifting at work and baby Baseline:  Goal status: IN PROGRESS (still needs curing on breathing technique) 01/13/2024   LONG TERM GOALS: Target date: 06/04/2023  Pt will be independent with advanced HEP.   Baseline:  Goal status: IN PROGRESS 01/13/2024  2.  Pt will soak 0 pads/ day in order to run errands and not have to interrupt daily tasks.  Baseline:  Goal status: MET 01/13/2024  3.  Pt will have reduced low back pain to max 1/10 in order to be able to do functional activities such as bending, lifting, twisting and walking as needed to be able to take care of her family and participate in job duties.  Baseline:  Goal status: IN PROGRESS 01/13/2024  4.  Patient will be able to stand and walk at work for 12 hours without increased low back pain Baseline:  Goal status: IN PROGRESS (pain starts around 3 hours) 01/13/2024  5.  Patient will demonstrate bilateral hip pain 5/5 in order to increase lumbopelvic support Baseline:  Goal status:IN PROGRESS 01/13/2024    PLAN:  PT FREQUENCY: 1-2x/week  PT DURATION: 6 months  PLANNED INTERVENTIONS: 97110-Therapeutic exercises, 97530- Therapeutic activity, 97112- Neuromuscular re-education, 97535- Self Care, [id number]- Manual therapy, [phone]- Electrical stimulation (manual), [phone]- Ionotophoresis 4mg /ml Dexamethasone, 79439 (1-2 muscles), 20561 (3+ muscles)- Dry Needling, Patient/Family education, Taping, Joint mobilization, Joint  manipulation, Spinal manipulation, Spinal mobilization, Scar mobilization, Cryotherapy, Moist heat, and Biofeedback  PLAN FOR NEXT SESSION ortho- focus on hip and abdominal strengthening with exhale on exertion to reduce pressure on pelvic floor- nustep, back strengthening, VC's to manage pressures Pelvic- education on urge suppression, constipation strategies, core coordination    Kristeen Sar, PT, DPT 01/19/2024 12:49 PM     "

## 2024-01-26 ENCOUNTER — Encounter: Payer: Self-pay | Admitting: Physical Therapy

## 2024-01-26 ENCOUNTER — Ambulatory Visit: Admitting: Physical Therapy

## 2024-01-26 DIAGNOSIS — M5459 Other low back pain: Secondary | ICD-10-CM

## 2024-01-26 DIAGNOSIS — M6281 Muscle weakness (generalized): Secondary | ICD-10-CM

## 2024-01-26 DIAGNOSIS — M25571 Pain in right ankle and joints of right foot: Secondary | ICD-10-CM

## 2024-01-26 DIAGNOSIS — G8929 Other chronic pain: Secondary | ICD-10-CM

## 2024-01-26 NOTE — Therapy (Signed)
 " OUTPATIENT PHYSICAL THERAPY FEMALE PELVIC TREATMENT   Patient Name: Terri Ramirez MRN: [id number] DOB:18-Jun-1995, 29 y.o., female Today's Date: 01/26/2024  END OF SESSION:  PT End of Session - 01/26/24 1151     Visit Number 6    Date for Recertification  06/03/24    Authorization Type BCBS, [place] medicaid [employer]    Authorization Time Period Approved 6 vl 01/19/2024-04/07/2024-auth#[id number]    Authorization - Visit Number 1    Authorization - Number of Visits 6    PT Start Time 1108    PT Stop Time 1148    PT Time Calculation (min) 40 min    Activity Tolerance Patient tolerated treatment well    Behavior During Therapy WFL for tasks assessed/performed              Past Medical History:  Diagnosis Date   Anxiety    Gestational diabetes    Low blood pressure    Low sodium levels    History reviewed. No pertinent surgical history. 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    PCP: Allwardt, Mardy HERO, PA-C  REFERRING PROVIDER: Verta Blossom, CNM  REFERRING DIAG: [phone] (ICD-10-CM) - Other specified disorders of muscle  THERAPY DIAG:  Other low back pain  Muscle weakness (generalized)  Chronic pain of right knee  Pain in right ankle and joints of right foot  Rationale for Evaluation and Treatment: Rehabilitation  ONSET DATE: 8 months ago- since birth 03/15/2023  SUBJECTIVE:                                                                                                                                                                                           SUBJECTIVE STATEMENT: Patient reports she is doing okay today. She has not had a chance to do  her exercises. No pain right now.  From eval Patient reports that after she gave birth, her pelvic floor has been really really weak.  Cannot hold her pee, sometimes feels heavy, feels like her insides will fall out Feels like she never healed down there, a single mom, went to work 2 months PP  Fluid intake: drinks a lot of water  FUNCTIONAL LIMITATIONS: works nights, urinates often, single mom  PERTINENT HISTORY:  Medications for current condition: no Surgeries: no Other: no Sexual abuse: No  PAIN:  Are you having pain?  No pelvic pain- really bad back pain since birth NPRS scale: 5/10 Pain location: back  Pain type: aching Pain description: constant   Aggravating factors: hurts constantly, might be how they co sleep Relieving factors: nothing  PRECAUTIONS: None  RED FLAGS: None   WEIGHT BEARING RESTRICTIONS: No  FALLS:  Has patient fallen in last 6 months? 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, lifts things, picks up boxes, walks, wears steel toe shoes  ACTIVITY LEVEL : active at work, walks her dog  PLOF: Independent  PATIENT GOALS: to put her pelvic floor where it was, feels like her muscles are so weak   BOWEL MOVEMENT: no issues URINATION: cannot hold her pee, sometimes feels heavy, feels like her insides will fall out Pain with urination: No Fully empty bladder: No                                         Post-void dribble: Yes a little Stream: Strong Urgency: Yes  Frequency:during the day yes                                                        Nocturia: no, gets up with the baby   Leakage: Urge to void Pads/briefs: No  INTERCOURSE: not active   PREGNANCY: Vaginal deliveries 1 Tearing Yes: on right side, baby was 8.5 lbs Episiotomy No C-section deliveries 0 Currently pregnant No  PROLAPSE: Heaviness and pressure   OBJECTIVE:  Note: Objective measures were completed at Evaluation unless otherwise  noted.  DIAGNOSTIC FINDINGS:  Post-void residual: Voiding Cystourethrogram (VCUG):  Ultrasound:   PATIENT SURVEYS:    PFIQ-7: 105 UIQ-7 33 CRAIG -7 45 POPIQ-7 43   01/13/2024 PFIQ-7: 71 UIQ-7 24 CRAIG -7 24 POPIQ-7 24  COGNITION: Overall cognitive status: Within functional limits for tasks assessed     SENSATION: Light touch: Appears intact  LUMBAR SPECIAL TESTS:  Slump test: Negative  FUNCTIONAL TESTS:   Single leg stance: to be tested  Rt:  Lt: Sit-up test: difficult Squat: Bed mobility:breath holding  GAIT: Assistive device utilized: None Comments: WFL  POSTURE: rounded shoulders, forward head, and increased lumbar lordosis   LUMBARAROM/PROM: full  A/PROM A/PROM  Eval (% available)  Flexion   Extension   Right lateral flexion   Left lateral flexion   Right rotation   Left rotation    (Blank rows = not tested)  LOWER EXTREMITY MNF:qloo LOWER EXTREMITY MMT: bilateral hips 4-/5 PALPATION:    Pelvic Alignment: even  Abdominal:   Diastasis: Yes: 1 finger Distortion: Yes  - doming Breathing: breath holding strategies with lifting baby, #10 kettle bell and transfers  Scar tissue: Yes:  at vaginal opening Active Straight Leg Raise: NT                External Perineal Exam: mild dryness throughout, signs of decreased estrogen                             Internal Pelvic Floor: able to lift pelvic floor, stool in rectum, improved lift with exhale with theraband horizontal abduction and VC's  Patient confirms identification and approves PT to assess  internal pelvic floor and treatment Yes All internal or external pelvic floor assessments and/or treatments are completed with proper hand hygiene and gloves hands. If needed gloves are changed with hand hygiene during patient care time.  PELVIC MMT:   MMT eval  Vaginal 2/5  Internal Anal Sphincter   External Anal Sphincter   Puborectalis   (Blank rows = not tested)         TONE: average  PROLAPSE: Mild posterior vaginal wall laxity and stool in rectum, bladder descent with cough  TODAY'S TREATMENT:                                                                                                                              DATE:  01/26/2024 Hooklying TA activation + pelvic floor activation x 10 Hooklying TA activation + hip adduction ball squeeze x 10 Hooklying TA activation + unilateral hip abduction with yellow loop x 10 bilateral  Hooklying TA activation + bent knee fallout with yellow loop 2 x 10 bilateral  Supine bridge 2 x 10 Hip internal rotation x 10 each side 5 sec hold Unilateral butterfly stretch 2 x 20 sec bilateral  Quadruped hip adductor rocking x 10 bilateral     01/19/2024 Patient was late to appointment Update on status Sidelying clamshell with red loop 2 x 10 bilateral (patient had increased calf pain so decreased to yellow but she felt it was still challenging so took away band)  Supine bridge 2 x 10 Hooklying march with TA activation x 20 total    01/13/2024 Update on status PFIQ -7 & goal assessment Hooklying alt hand and knee press with purple ball x 10 bilateral  Supine bridge with purple ball squeeze 2 x 10 Hooklying TA activation + hip abduction with red loop 2 x 10 Hooklying TA activation + march with red loop x 20 total Sit to stand +  TA activation + pelvic floor contraction x 10    12/26/2023 Update on status Review of urgency drill Hooklying feet supported on bolster pelvic tilt + pelvic floor contraction x 30 Hooklying TA activation + pelvic floor contraction + ball squeeze x20 Sit to stand +  TA activation + pelvic floor contraction x 10 Sit to stand +  TA activation + pelvic floor contraction  holding [place] (29lbs) x 10 Review and update of HEP     PATIENT EDUCATION:  Education details: Pt was educated on relevant anatomy, exam findings, home exercise program, plan of care, expectations of PT and  pressure management exercises with exhale  Person educated: Patient Education method: Explanation, Demonstration, Tactile cues, Verbal cues, and Handouts Education comprehension: verbalized understanding, returned demonstration, verbal cues required, tactile cues required, and needs further education  HOME EXERCISE PROGRAM: Access Code: [email or web address] URL: [email or web address] Date: 01/26/2024 Prepared by: Kristeen Sar  Exercises - Diaphragmatic Breathing to Reduce Intra-abdominal Pressure: Lifting a Basket  - 1 x daily - 7 x weekly - 2 sets - 10  reps - Diaphragmatic Breathing to Reduce Intra-abdominal Pressure: Sit to Stand  - 1 x daily - 7 x weekly - 2 sets - 10 reps - Horizontal abd with TB with TRA breath  - 1 x daily - 7 x weekly - 2 sets - 10 reps - Pelvic Floor Muscle Contraction and Pelvic Tilt With Hips Elevated (for Pelvic Organ Prolapse)  - 1 x daily - 7 x weekly - 2 sets - 10 reps - Pelvic Floor Muscle Contraction and Adductor Squeeze With Hips Elevated (for Pelvic Organ Prolapse)  - 1 x daily - 7 x weekly - 2 sets - 10 reps - Sit to Stand  - 1 x daily - 7 x weekly - 1-2 sets - 10 reps - Supine Hip Internal and External Rotation  - 1 x daily - 7 x weekly - 1 sets - 10 reps - 5 hold - Kneeling Adductor Stretch with Hip External Rotation  - 1 x daily - 7 x weekly - 2 sets - 10 reps - Unilateral Supported Supine Butterfly Stretch  - 1 x daily - 7 x weekly - 2 sets - 20-30 hold  ASSESSMENT:  CLINICAL IMPRESSION: Patient verbalized feeling 50% better since starting skilled therapy. She has been incorporating the knack technique with urge incontinence and she has noted great improvements. Se verbalized poor compliance with HEP exercises. She tolerated treatment session well and required verbal and tactile cues for correct exercise performance. Incorporated pelvic floor activation with exercise today. She felt good with all exercises performed today especially the hip mobility  exercises. Updated HEP to include these. Patient will benefit from skilled PT to address the below impairments and improve overall function.         Patient is a 29 y.o. F who was seen today for physical therapy evaluation and treatment for pelvic pressure and urge incontinence. She also has significant low back pain. Patient reports that symptoms have been bothersome. Patient reports that she feels like her organs are going to fall out. Exam findings are notable for upper chest breathing strategies, abdominal weakness with doming, pelvic floor muscle weakness, average tone in pelvic floor. External soft tissues of pelvic floor appear dry with signs of decreased estrogen. Patient demonstrates fair trunk mobility, bilateral hip weakness  and pain in her back. It is difficult for patient to lift boxes at work and baby due to weakness, she compensates with breath holding and increases pressure on her pelvic floor . Discussed findings with patient, educated patient on pressure management strategies, practices some in the clinic with thera band and baby and HEP was initiated. Patient's quality of life has been affected, patient is frustrated and will benefit from physical therapy to address deficits, reduce pelvic pressure, low back pain and urge incontinence and improve quality of life.    OBJECTIVE IMPAIRMENTS: decreased activity tolerance, decreased coordination, decreased endurance, decreased ROM, decreased strength, increased muscle spasms, impaired tone, and pain.   ACTIVITY LIMITATIONS: carrying, lifting, bending, standing, transfers, continence, toileting, and caring for others  PARTICIPATION LIMITATIONS: community activity and occupation  PERSONAL FACTORS: Time since onset of injury/illness/exacerbation are also affecting patient's functional outcome.   REHAB POTENTIAL: Good  CLINICAL DECISION MAKING: Evolving/moderate complexity  EVALUATION COMPLEXITY: Moderate   GOALS: Goals reviewed  with patient? Yes  SHORT TERM GOALS: Target date: 01/02/2024    Pt will be independent with HEP.   Baseline: Goal status: MET 01/13/2024  2.  Pt will be independent with use of squatty potty, relaxed  toileting mechanics, and improved bowel movement techniques in order to increase ease of bowel movements and complete evacuation.   Baseline:  Goal status: IN PROGRESS 01/13/2024  3.   Pt will demonstrate appropriate lateral rib cage excursion with inhale to ensure better abdominal pressure management and pelvic floor/abdominal muscle relaxation without overusing upper traps in order to demonstrate better pressure management strategies with lifting at work and baby Baseline:  Goal status: IN PROGRESS (still needs curing on breathing technique) 01/13/2024   LONG TERM GOALS: Target date: 06/03/2024  Pt will be independent with advanced HEP.   Baseline:  Goal status: IN PROGRESS 01/13/2024  2.  Pt will soak 0 pads/ day in order to run errands and not have to interrupt daily tasks.  Baseline:  Goal status: MET 01/13/2024  3.  Pt will have reduced low back pain to max 1/10 in order to be able to do functional activities such as bending, lifting, twisting and walking as needed to be able to take care of her family and participate in job duties.  Baseline:  Goal status: IN PROGRESS 01/13/2024  4.  Patient will be able to stand and walk at work for 12 hours without increased low back pain Baseline:  Goal status: IN PROGRESS (pain starts around 3 hours) 01/13/2024  5.  Patient will demonstrate bilateral hip pain 5/5 in order to increase lumbopelvic support Baseline:  Goal status:IN PROGRESS 01/13/2024    PLAN:  PT FREQUENCY: 1-2x/week  PT DURATION: 6 months  PLANNED INTERVENTIONS: 97110-Therapeutic exercises, 97530- Therapeutic activity, 97112- Neuromuscular re-education, 97535- Self Care, 02859- Manual therapy, [phone]- Electrical stimulation (manual), [phone]- Ionotophoresis 4mg /ml  Dexamethasone, 79439 (1-2 muscles), 20561 (3+ muscles)- Dry Needling, Patient/Family education, Taping, Joint mobilization, Joint manipulation, Spinal manipulation, Spinal mobilization, Scar mobilization, Cryotherapy, Moist heat, and Biofeedback  PLAN FOR NEXT SESSION schedule more to cover insurance visits approved; focus on hip and abdominal strengthening with exhale on exertion to reduce pressure on pelvic floor- nustep, back strengthening, VC's to manage pressures    Kristeen Sar, PT, DPT 01/26/2024 11:53 AM      "

## 2024-01-28 NOTE — Telephone Encounter (Signed)
 Please review and advise- I tried to call patient.

## 2024-02-03 ENCOUNTER — Ambulatory Visit: Admitting: Physical Therapy

## 2024-02-04 ENCOUNTER — Ambulatory Visit: Admitting: Physical Therapy

## 2024-02-04 ENCOUNTER — Other Ambulatory Visit: Payer: Self-pay | Admitting: Physician Assistant

## 2024-02-04 ENCOUNTER — Other Ambulatory Visit: Payer: Self-pay | Admitting: Family Medicine

## 2024-02-04 DIAGNOSIS — E119 Type 2 diabetes mellitus without complications: Secondary | ICD-10-CM

## 2024-02-04 DIAGNOSIS — R293 Abnormal posture: Secondary | ICD-10-CM

## 2024-02-04 DIAGNOSIS — M5459 Other low back pain: Secondary | ICD-10-CM | POA: Diagnosis not present

## 2024-02-04 DIAGNOSIS — R102 Pelvic and perineal pain unspecified side: Secondary | ICD-10-CM

## 2024-02-04 DIAGNOSIS — M6281 Muscle weakness (generalized): Secondary | ICD-10-CM

## 2024-02-04 DIAGNOSIS — R279 Unspecified lack of coordination: Secondary | ICD-10-CM

## 2024-02-04 MED ORDER — FREESTYLE LIBRE 3 PLUS SENSOR MISC: 0 refills | Status: DC

## 2024-02-04 NOTE — Telephone Encounter (Signed)
 Copied from CRM #[id number]. Topic: Clinical - Medication Question >> Feb 04, 2024  1:22 PM Terri G wrote: Reason for CRM: Patient tried to refill her prescription but pharmacy stated it has expired and she needs a new prescription written for her  Continuous Glucose Sensor (FREESTYLE LIBRE 3 PLUS SENSOR) MISC

## 2024-02-04 NOTE — Telephone Encounter (Signed)
 Copied from CRM #[id number]. Topic: Clinical - Medication Refill >> Feb 04, 2024  1:27 PM Tysheama G wrote: Medication:   insulin  glargine (LANTUS ) 100 UNIT/ML Solostar Pen Insulin  Pen Needle (EMBECTA PEN NEEDLE NANO 2 GEN) 32G X 4 MM MISC  Has the patient contacted their pharmacy? Yes (Agent: If no, request that the patient contact the pharmacy for the refill. If patient does not wish to contact the pharmacy document the reason why and proceed with request.) (Agent: If yes, when and what did the pharmacy advise?)  This is the patient's preferred pharmacy:    [id number] - [place], [place] - [address] AT [facility] [address]  [place] [address] Phone: [phone] Fax: [phone]  Is this the correct pharmacy for this prescription? Yes If no, delete pharmacy and type the correct one.   Has the prescription been filled recently? No  Is the patient out of the medication? Yes  Has the patient been seen for an appointment in the last year OR does the patient have an upcoming appointment? Yes  Can we respond through MyChart? Yes  Agent: Please be advised that Rx refills may take up to 3 business days. We ask that you follow-up with your pharmacy.

## 2024-02-04 NOTE — Patient Instructions (Signed)
 Toileting Mechanics 101: Urination  Positioning: sit all the way down on the toilet, feet flat on the floor, trunk relaxed Hovering over the toilet seat can impact your ability to relax the pelvic floor musculature and empty your bladder well  When you initiate urination, try to let the urine flow out naturally rather than pushing down. Pushing can limit the ability of your urethral sphincter to relax and open. Double voiding technique: When you think you are done urinating, try gently pushing to see if there is any remaining urine that can be expelled   You can try the following techniques to see if there is any remaining urine that can be expelled: Rocking back and forth Leaning side to side  Twisting your trunk  Taking deep belly breaths to promote blood flow to pelvic floor musculature  Defecation  Positioning: sit all the way down on the toilet, feet flat on the floor, trunk relaxed You can try using a squatty potty or toilet stool under your feet to change the angle of your rectum in your pelvic floor, making it easier to pass stool with less straining  Avoid straining or breath holding while on the toilet. This causes increased intra-abdominal pressure, which causes increased pressure down through the pelvic floor. We want to avoid this if possible. If you do feel the need to push to pass bowel movements, practice the following technique instead: Take a deep breath in, focusing on blowing up your belly like  a balloon  Imagine you are fogging up a mirror with your breath as you exhale and gently push down and out through your pelvic floor. This relieves some pressure while you're still able to push the stool out.  You can try the following techniques to see if there is any remaining stool that can be expelled: Rocking back and forth Leaning side to side  Twisting your trunk  Taking deep belly breaths to promote blood flow to pelvic floor musculature

## 2024-02-04 NOTE — Therapy (Signed)
 " OUTPATIENT PHYSICAL THERAPY FEMALE PELVIC TREATMENT   Patient Name: Terri Ramirez MRN: [id number] DOB:Aug 01, 1995, 29 y.o., female Today's Date: 02/04/2024  END OF SESSION:  PT End of Session - 02/04/24 1317     Visit Number 7    Date for Recertification  06/03/24    Authorization Type BCBS, [place] medicaid [employer]    Authorization Time Period Approved 6 vl 01/19/2024-04/07/2024-auth#[id number]    Authorization - Visit Number 2    Authorization - Number of Visits 6    Activity Tolerance Patient tolerated treatment well    Behavior During Therapy WFL for tasks assessed/performed               Past Medical History:  Diagnosis Date   Anxiety    Gestational diabetes    Low blood pressure    Low sodium levels    No past surgical history on file. 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    PCP: Allwardt, Mardy HERO, PA-C  REFERRING PROVIDER: Verta Blossom, CNM  REFERRING DIAG: [phone] (ICD-10-CM) - Other specified disorders of muscle  THERAPY DIAG:  Abnormal posture  Muscle weakness (generalized)  Pelvic pain  Unspecified lack of coordination  Rationale for Evaluation and Treatment: Rehabilitation  ONSET DATE: 8 months ago- since birth 03/15/2023  SUBJECTIVE:                                                                                                                                                                                           SUBJECTIVE STATEMENT: She has some vaginal pressure when she has bowel movements and when she pushes to pee. 0/10 pain at rest today. 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   From eval Patient reports that after she gave  birth, her pelvic floor has been really really weak.  Cannot hold her pee, sometimes feels heavy, feels like her insides will fall out Feels like she never healed down there, a single mom, went to work 2 months PP  Fluid intake: drinks a lot of water  FUNCTIONAL LIMITATIONS: works nights, urinates often, single mom  PERTINENT HISTORY:  Medications for current condition: no Surgeries: no Other: no Sexual abuse: No  PAIN:  Are you having pain? No pelvic pain- really bad back pain since birth NPRS scale: 5/10 Pain location: back  Pain type: aching Pain description: constant  Aggravating factors: hurts constantly, might be how they co sleep Relieving factors: nothing  PRECAUTIONS: None  RED FLAGS: None   WEIGHT BEARING RESTRICTIONS: No  FALLS:  Has patient fallen in last 6 months? 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, lifts things, picks up boxes, walks, wears steel toe shoes  ACTIVITY LEVEL : active at work, walks her dog  PLOF: Independent  PATIENT GOALS: to put her pelvic floor where it was, feels like her muscles are so weak   BOWEL MOVEMENT: no issues URINATION: cannot hold her pee, sometimes feels heavy, feels like her insides will fall out Pain with urination: No Fully empty bladder: No                                         Post-void dribble: Yes a little Stream: Strong Urgency: Yes  Frequency:during the day yes                                                        Nocturia: no, gets up with the baby   Leakage: Urge to void Pads/briefs: No  INTERCOURSE: not active   PREGNANCY: Vaginal deliveries 1 Tearing Yes: on right side, baby was 8.5 lbs Episiotomy No C-section deliveries 0 Currently pregnant No  PROLAPSE: Heaviness and pressure   OBJECTIVE:  Note: Objective measures were completed at Evaluation unless otherwise noted.  DIAGNOSTIC FINDINGS:  Post-void residual: Voiding Cystourethrogram (VCUG):   Ultrasound:   PATIENT SURVEYS:    PFIQ-7: 105 UIQ-7 33 CRAIG -7 45 POPIQ-7 43   01/13/2024 PFIQ-7: 71 UIQ-7 24 CRAIG -7 24 POPIQ-7 24  COGNITION: Overall cognitive status: Within functional limits for tasks assessed     SENSATION: Light touch: Appears intact  LUMBAR SPECIAL TESTS:  Slump test: Negative  FUNCTIONAL TESTS:   Single leg stance: to be tested  Rt:  Lt: Sit-up test: difficult Squat: Bed mobility:breath holding  GAIT: Assistive device utilized: None Comments: WFL  POSTURE: rounded shoulders, forward head, and increased lumbar lordosis   LUMBARAROM/PROM: full  A/PROM A/PROM  Eval (% available)  Flexion   Extension   Right lateral flexion   Left lateral flexion   Right rotation   Left rotation    (Blank rows = not tested)  LOWER EXTREMITY MNF:qloo LOWER EXTREMITY MMT: bilateral hips 4-/5 PALPATION:    Pelvic Alignment: even  Abdominal:   Diastasis: Yes: 1 finger Distortion: Yes  - doming Breathing: breath holding strategies with lifting baby, #10 kettle bell and transfers  Scar tissue: Yes:  at vaginal opening Active Straight Leg Raise: NT                External Perineal Exam: mild dryness throughout, signs of decreased estrogen                             Internal Pelvic Floor: able to lift pelvic floor, stool in rectum, improved lift with exhale with theraband horizontal abduction and VC's  Patient confirms identification and approves PT to assess internal pelvic floor and treatment Yes All internal or external pelvic floor assessments and/or treatments are completed with proper hand hygiene and gloves hands.  If needed gloves are changed with hand hygiene during patient care time.  PELVIC MMT:   MMT eval  Vaginal 2/5  Internal Anal Sphincter   External Anal Sphincter   Puborectalis   (Blank rows = not tested)        TONE: average  PROLAPSE: Mild posterior vaginal wall laxity and stool in rectum, bladder descent  with cough  TODAY'S TREATMENT:                                                                                                                              DATE:  02/04/24: Toileting mechanics for optimal emptying of bladder and bowels  Blow as you go technique (balloon breathing) Double voiding technique  Relaxing when urinating rather than straining to push the urine out  Bridge + adductor ball squeeze + transverse abdominis contraction 2x10  Seated adductor ball squeeze + transverse abdominis contraction + diaphragmatic breathing 2x10  Seated internal rotation + adductor ball squeeze + diaphragmatic breathing 2x12 alternating sides  Seated hip abduction (GTB) + diaphragmatic breathing 2x10 - I forgot to give her a band for this- she might ask for one!  01/26/2024 Hooklying TA activation + pelvic floor activation x 10 Hooklying TA activation + hip adduction ball squeeze x 10 Hooklying TA activation + unilateral hip abduction with yellow loop x 10 bilateral  Hooklying TA activation + bent knee fallout with yellow loop 2 x 10 bilateral  Supine bridge 2 x 10 Hip internal rotation x 10 each side 5 sec hold Unilateral butterfly stretch 2 x 20 sec bilateral  Quadruped hip adductor rocking x 10 bilateral   01/19/2024 Patient was late to appointment Update on status Sidelying clamshell with red loop 2 x 10 bilateral (patient had increased calf pain so decreased to yellow but she felt it was still challenging so took away band)  Supine bridge 2 x 10 Hooklying march with TA activation x 20 total    01/13/2024 Update on status PFIQ -7 & goal assessment Hooklying alt hand and knee press with purple ball x 10 bilateral  Supine bridge with purple ball squeeze 2 x 10 Hooklying TA activation + hip abduction with red loop 2 x 10 Hooklying TA activation + march with red loop x 20 total Sit to stand +  TA activation + pelvic floor contraction x 10    12/26/2023 Update on status Review of  urgency drill Hooklying feet supported on bolster pelvic tilt + pelvic floor contraction x 30 Hooklying TA activation + pelvic floor contraction + ball squeeze x20 Sit to stand +  TA activation + pelvic floor contraction x 10 Sit to stand +  TA activation + pelvic floor contraction  holding [place] (29lbs) x 10 Review and update of HEP  PATIENT EDUCATION:  Education details: Pt was educated on relevant anatomy, exam findings, home exercise program, plan of care, expectations of PT and pressure management exercises with exhale  Person educated: Patient  Education method: Explanation, Demonstration, Tactile cues, Verbal cues, and Handouts Education comprehension: verbalized understanding, returned demonstration, verbal cues required, tactile cues required, and needs further education  HOME EXERCISE PROGRAM: Access Code: [email or web address] URL: [email or web address] Date: 01/26/2024 Prepared by: Kristeen Sar  Exercises - Diaphragmatic Breathing to Reduce Intra-abdominal Pressure: Lifting a Basket  - 1 x daily - 7 x weekly - 2 sets - 10 reps - Diaphragmatic Breathing to Reduce Intra-abdominal Pressure: Sit to Stand  - 1 x daily - 7 x weekly - 2 sets - 10 reps - Horizontal abd with TB with TRA breath  - 1 x daily - 7 x weekly - 2 sets - 10 reps - Pelvic Floor Muscle Contraction and Pelvic Tilt With Hips Elevated (for Pelvic Organ Prolapse)  - 1 x daily - 7 x weekly - 2 sets - 10 reps - Pelvic Floor Muscle Contraction and Adductor Squeeze With Hips Elevated (for Pelvic Organ Prolapse)  - 1 x daily - 7 x weekly - 2 sets - 10 reps - Sit to Stand  - 1 x daily - 7 x weekly - 1-2 sets - 10 reps - Supine Hip Internal and External Rotation  - 1 x daily - 7 x weekly - 1 sets - 10 reps - 5 hold - Kneeling Adductor Stretch with Hip External Rotation  - 1 x daily - 7 x weekly - 2 sets - 10 reps - Unilateral Supported Supine Butterfly Stretch  - 1 x daily - 7 x weekly - 2 sets - 20-30  hold  ASSESSMENT:  CLINICAL IMPRESSION: Patient is continuing to demonstrate good progress in physical therapy, feeling good upon arrival today. She's noticed more vaginal pressure recently, specifically when she strains to urinate or when she is passing bowel movements. She has no pain upon arrival today. We discussed toileting mechanics for optimal emptying of bladder and bowels, and pt reports that she has been straining while on the toilet and plans to work on this. Incorporated pelvic floor activation and transverse abdominis activation with exercise today. She felt good with all exercises performed today especially the hip mobility exercises. HEP updated to include gluteal and hip strengthening as pt wishes to return to gym soon. Patient will benefit from skilled PT to address the below impairments and improve overall function.    Patient is a 29 y.o. F who was seen today for physical therapy evaluation and treatment for pelvic pressure and urge incontinence. She also has significant low back pain. Patient reports that symptoms have been bothersome. Patient reports that she feels like her organs are going to fall out. Exam findings are notable for upper chest breathing strategies, abdominal weakness with doming, pelvic floor muscle weakness, average tone in pelvic floor. External soft tissues of pelvic floor appear dry with signs of decreased estrogen. Patient demonstrates fair trunk mobility, bilateral hip weakness  and pain in her back. It is difficult for patient to lift boxes at work and baby due to weakness, she compensates with breath holding and increases pressure on her pelvic floor . Discussed findings with patient, educated patient on pressure management strategies, practices some in the clinic with thera band and baby and HEP was initiated. Patient's quality of life has been affected, patient is frustrated and will benefit from physical therapy to address deficits, reduce pelvic pressure,  low back pain and urge incontinence and improve quality of life.    OBJECTIVE IMPAIRMENTS: decreased activity tolerance, decreased coordination, decreased endurance, decreased ROM, decreased strength, increased muscle  spasms, impaired tone, and pain.   ACTIVITY LIMITATIONS: carrying, lifting, bending, standing, transfers, continence, toileting, and caring for others  PARTICIPATION LIMITATIONS: community activity and occupation  PERSONAL FACTORS: Time since onset of injury/illness/exacerbation are also affecting patient's functional outcome.   REHAB POTENTIAL: Good  CLINICAL DECISION MAKING: Evolving/moderate complexity  EVALUATION COMPLEXITY: Moderate   GOALS: Goals reviewed with patient? Yes  SHORT TERM GOALS: Target date: 01/02/2024    Pt will be independent with HEP.   Baseline: Goal status: MET 01/13/2024  2.  Pt will be independent with use of squatty potty, relaxed toileting mechanics, and improved bowel movement techniques in order to increase ease of bowel movements and complete evacuation.   Baseline:  Goal status: IN PROGRESS 01/13/2024  3.   Pt will demonstrate appropriate lateral rib cage excursion with inhale to ensure better abdominal pressure management and pelvic floor/abdominal muscle relaxation without overusing upper traps in order to demonstrate better pressure management strategies with lifting at work and baby Baseline:  Goal status: IN PROGRESS (still needs curing on breathing technique) 01/13/2024   LONG TERM GOALS: Target date: 06/03/2024  Pt will be independent with advanced HEP.   Baseline:  Goal status: IN PROGRESS 01/13/2024  2.  Pt will soak 0 pads/ day in order to run errands and not have to interrupt daily tasks.  Baseline:  Goal status: MET 01/13/2024  3.  Pt will have reduced low back pain to max 1/10 in order to be able to do functional activities such as bending, lifting, twisting and walking as needed to be able to take care of her  family and participate in job duties.  Baseline:  Goal status: IN PROGRESS 01/13/2024  4.  Patient will be able to stand and walk at work for 12 hours without increased low back pain Baseline:  Goal status: IN PROGRESS (pain starts around 3 hours) 01/13/2024  5.  Patient will demonstrate bilateral hip pain 5/5 in order to increase lumbopelvic support Baseline:  Goal status:IN PROGRESS 01/13/2024    PLAN:  PT FREQUENCY: 1-2x/week  PT DURATION: 6 months  PLANNED INTERVENTIONS: 97110-Therapeutic exercises, 97530- Therapeutic activity, 97112- Neuromuscular re-education, 97535- Self Care, 02859- Manual therapy, [phone]- Electrical stimulation (manual), [phone]- Ionotophoresis 4mg /ml Dexamethasone, 79439 (1-2 muscles), 20561 (3+ muscles)- Dry Needling, Patient/Family education, Taping, Joint mobilization, Joint manipulation, Spinal manipulation, Spinal mobilization, Scar mobilization, Cryotherapy, Moist heat, and Biofeedback  PLAN FOR NEXT SESSION schedule more to cover insurance visits approved; focus on hip and abdominal strengthening with exhale on exertion to reduce pressure on pelvic floor- nustep, back strengthening, VC's to manage pressures   Celena Domino, PT, DPT 02/04/24 1:19 PM [facility] [address] [place], [place] [address] Phone # [phone] Fax [phone]      "

## 2024-02-05 ENCOUNTER — Ambulatory Visit: Admitting: Physician Assistant

## 2024-02-05 ENCOUNTER — Encounter: Payer: Self-pay | Admitting: Physician Assistant

## 2024-02-05 VITALS — BP 110/74 | HR 79 | Temp 98.2°F | Ht 63.0 in | Wt 172.0 lb

## 2024-02-05 DIAGNOSIS — F32A Depression, unspecified: Secondary | ICD-10-CM

## 2024-02-05 DIAGNOSIS — E119 Type 2 diabetes mellitus without complications: Secondary | ICD-10-CM

## 2024-02-05 DIAGNOSIS — F419 Anxiety disorder, unspecified: Secondary | ICD-10-CM

## 2024-02-05 DIAGNOSIS — Z794 Long term (current) use of insulin: Secondary | ICD-10-CM

## 2024-02-05 MED ORDER — FREESTYLE LIBRE 3 PLUS SENSOR MISC: 5 refills | Status: AC

## 2024-02-05 MED ORDER — ESCITALOPRAM OXALATE 10 MG PO TABS: 10.0000 mg | ORAL_TABLET | Freq: Every day | ORAL | 1 refills | Status: AC

## 2024-02-05 NOTE — Progress Notes (Signed)
 "   Patient ID: Terri Ramirez, female    DOB: 07/29/1995, 29 y.o.   MRN: [id number]   Assessment & Plan:  Type 2 diabetes mellitus with insulin  therapy (HCC) -     FreeStyle Libre 3 Plus Sensor; Change sensor every 15 days.  Dispense: 2 each; Refill: 5  Anxiety and depression -     Escitalopram  Oxalate; Take 1 tablet (10 mg total) by mouth daily.  Dispense: 90 tablet; Refill: 1      Assessment and Plan Assessment & Plan Type 2 diabetes mellitus Recent lapse in glucose monitoring due to expired sensor. Estimated glucose levels in the mid to early 200s without hypoglycemic or hyperglycemic symptoms. Improved dietary management with prepped meals and groceries through [employer]. Awaiting endocrinology appointment for further management and A1c evaluation. - Prescribed Libre sensor for glucose monitoring. - Continue current dietary management with prepped meals and groceries. - Follow up with endocrinology on February 5th for further management and A1c evaluation. Lab Results  Component Value Date   HGBA1C 10.4 (H) 01/05/2024   HGBA1C 10.3 10/01/2023   HGBA1C 5.6 03/12/2022     Depression and anxiety Managed with Lexapro . Reports improved mood and motivation after consistent use for 2-3 weeks. Plans to continue Lexapro  as it positively impacts her mental health. - Prescribed 90-day supply of Lexapro  with a refill.     F/up as scheduled     Subjective:    Chief Complaint  Patient presents with   Medical Management of Chronic Issues    Pt in office concerned about recent losing insurance and wanting to get medications refilled and all things done needed before end of the month.     HPI Discussed the use of AI scribe software for clinical note transcription with the patient, who gave verbal consent to proceed.  History of Present Illness Terri Ramirez is a 29 year old female with diabetes who presents for medication management and insurance  concerns.  She is managing her diabetes with insulin  and metformin , administering 50 units of insulin  in the evening. She has not used her glucose sensor for the past week and a half, leading to uncertainty about her current blood sugar levels. She estimates her blood sugar to be in the mid to early 200s, as she has not experienced symptoms of hypoglycemia or hyperglycemia. She receives prepped meals through a program due to her A1c being above 8 or 9, which has helped her manage her diet better.  She is concerned about her insurance coverage after recently losing her job and is worried about losing her current healthcare providers. She has Medicaid through her child, which she is trying to maintain. She is exploring options for Obamacare and has an upcoming phone appointment to discuss this. She is trying to ensure she has enough medication refills due to these insurance uncertainties.  She is currently breastfeeding and continues to take Lexapro , which she started taking consistently about two to three weeks ago. She feels better and more motivated since starting the medication. She takes her Lexapro  at night along with her prenatals and insulin  shots.     Past Medical History:  Diagnosis Date   Anxiety    Gestational diabetes    Low blood pressure    Low sodium levels     History reviewed. No pertinent surgical history.  Family History  Adopted: Yes  Family history unknown: Yes    Social History[1]   Allergies[2]  Review of Systems NEGATIVE UNLESS OTHERWISE  INDICATED IN HPI      Objective:     BP 110/74 (BP Location: Left Arm, Patient Position: Sitting, Cuff Size: Normal)   Pulse 79   Temp 98.2 F (36.8 C) (Temporal)   Ht 5' 3 (1.6 m)   Wt 172 lb (78 kg)   LMP 12/29/2023 (Exact Date)   SpO2 96%   Breastfeeding Yes   BMI 30.47 kg/m   Wt Readings from Last 3 Encounters:  02/05/24 172 lb (78 kg)  01/05/24 172 lb 3.2 oz (78.1 kg)  11/25/23 178 lb (80.7 kg)     BP Readings from Last 3 Encounters:  02/05/24 110/74  01/05/24 120/70  11/25/23 108/74     Physical Exam Constitutional:      Appearance: Normal appearance.  Eyes:     Extraocular Movements: Extraocular movements intact.     Conjunctiva/sclera: Conjunctivae normal.     Pupils: Pupils are equal, round, and reactive to light.  Cardiovascular:     Rate and Rhythm: Normal rate and regular rhythm.     Pulses: Normal pulses.  Pulmonary:     Effort: Pulmonary effort is normal.  Skin:    Findings: No erythema, lesion or rash.  Neurological:     Mental Status: She is alert.     Sensory: No sensory deficit.  Psychiatric:        Mood and Affect: Mood normal.        Behavior: Behavior normal.             Terri Ramirez M Keerthana Vanrossum, PA-C     [1]  Social History Tobacco Use   Smoking status: Never   Smokeless tobacco: Never  Vaping Use   Vaping status: Never Used  Substance Use Topics   Alcohol use: Not Currently   Drug use: Not Currently    Types: Marijuana    Comment: none during preg  [2] No Known Allergies  "

## 2024-02-13 ENCOUNTER — Ambulatory Visit: Admitting: Physical Therapy

## 2024-02-13 ENCOUNTER — Encounter: Payer: Self-pay | Admitting: Physical Therapy

## 2024-02-13 DIAGNOSIS — M5459 Other low back pain: Secondary | ICD-10-CM | POA: Diagnosis not present

## 2024-02-13 DIAGNOSIS — R293 Abnormal posture: Secondary | ICD-10-CM

## 2024-02-13 DIAGNOSIS — G8929 Other chronic pain: Secondary | ICD-10-CM

## 2024-02-13 DIAGNOSIS — M6281 Muscle weakness (generalized): Secondary | ICD-10-CM

## 2024-02-13 NOTE — Therapy (Signed)
 " OUTPATIENT PHYSICAL THERAPY FEMALE PELVIC TREATMENT   Patient Name: Terri Ramirez MRN: [id number] DOB:02/03/1995, 29 y.o., female Today's Date: 02/13/2024  END OF SESSION:  PT End of Session - 02/13/24 1150     Visit Number 8    Date for Recertification  06/03/24    Authorization Type BCBS, [place] medicaid [employer]    Authorization Time Period Approved 6 vl 01/19/2024-04/07/2024-auth#[id number]    Authorization - Visit Number 3    Authorization - Number of Visits 6    PT Start Time 1106    PT Stop Time 1144    PT Time Calculation (min) 38 min    Activity Tolerance Patient tolerated treatment well    Behavior During Therapy WFL for tasks assessed/performed                Past Medical History:  Diagnosis Date   Anxiety    Gestational diabetes    Low blood pressure    Low sodium levels    History reviewed. No pertinent surgical history. 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    PCP: Allwardt, Mardy HERO, PA-C  REFERRING PROVIDER: Verta Blossom, CNM  REFERRING DIAG: [phone] (ICD-10-CM) - Other specified disorders of muscle  THERAPY DIAG:  Abnormal posture  Muscle weakness (generalized)  Other low back pain  Chronic pain of right knee  Rationale for Evaluation and Treatment: Rehabilitation  ONSET DATE: 8 months ago- since birth 03/15/2023  SUBJECTIVE:                                                                                                                                                                                           SUBJECTIVE STATEMENT: She had been having increased pelvic pain and feelings of heaviness. She has an OB appointment  scheduled for Jan 5th. She has incorporated some exercises she did last session.  From eval Patient reports that after she gave birth, her pelvic floor has been really really weak.  Cannot hold her pee, sometimes feels heavy, feels like her insides will fall out Feels like she never healed down there, a single mom, went to work 2 months PP  Fluid intake: drinks a lot of water  FUNCTIONAL LIMITATIONS: works nights, urinates often, single mom  PERTINENT HISTORY:  Medications for current condition: no Surgeries: no Other: no Sexual abuse: No  PAIN:  Are  you having pain? No pelvic pain- really bad back pain since birth NPRS scale: 5/10 Pain location: back  Pain type: aching Pain description: constant   Aggravating factors: hurts constantly, might be how they co sleep Relieving factors: nothing  PRECAUTIONS: None  RED FLAGS: None   WEIGHT BEARING RESTRICTIONS: No  FALLS:  Has patient fallen in last 6 months? 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, lifts things, picks up boxes, walks, wears steel toe shoes  ACTIVITY LEVEL : active at work, walks her dog  PLOF: Independent  PATIENT GOALS: to put her pelvic floor where it was, feels like her muscles are so weak   BOWEL MOVEMENT: no issues URINATION: cannot hold her pee, sometimes feels heavy, feels like her insides will fall out Pain with urination: No Fully empty bladder: No                                         Post-void dribble: Yes a little Stream: Strong Urgency: Yes  Frequency:during the day yes                                                        Nocturia: no, gets up with the baby   Leakage: Urge to void Pads/briefs: No  INTERCOURSE: not active   PREGNANCY: Vaginal deliveries 1 Tearing Yes: on right side, baby was 8.5 lbs Episiotomy No C-section deliveries 0 Currently pregnant No  PROLAPSE: Heaviness and pressure   OBJECTIVE:  Note: Objective measures were  completed at Evaluation unless otherwise noted.  DIAGNOSTIC FINDINGS:  Post-void residual: Voiding Cystourethrogram (VCUG):  Ultrasound:   PATIENT SURVEYS:    PFIQ-7: 105 UIQ-7 33 CRAIG -7 45 POPIQ-7 43   01/13/2024 PFIQ-7: 71 UIQ-7 24 CRAIG -7 24 POPIQ-7 24  COGNITION: Overall cognitive status: Within functional limits for tasks assessed     SENSATION: Light touch: Appears intact  LUMBAR SPECIAL TESTS:  Slump test: Negative  FUNCTIONAL TESTS:   Single leg stance: to be tested  Rt:  Lt: Sit-up test: difficult Squat: Bed mobility:breath holding  GAIT: Assistive device utilized: None Comments: WFL  POSTURE: rounded shoulders, forward head, and increased lumbar lordosis   LUMBARAROM/PROM: full  A/PROM A/PROM  Eval (% available)  Flexion   Extension   Right lateral flexion   Left lateral flexion   Right rotation   Left rotation    (Blank rows = not tested)  LOWER EXTREMITY MNF:qloo LOWER EXTREMITY MMT: bilateral hips 4-/5 PALPATION:    Pelvic Alignment: even  Abdominal:   Diastasis: Yes: 1 finger Distortion: Yes  - doming Breathing: breath holding strategies with lifting baby, #10 kettle bell and transfers  Scar tissue: Yes:  at vaginal opening Active Straight Leg Raise: NT                External Perineal Exam: mild dryness throughout, signs of decreased estrogen                             Internal Pelvic Floor: able to lift pelvic floor, stool in rectum, improved lift with exhale with theraband horizontal abduction and VC's  Patient confirms identification and approves  PT to assess internal pelvic floor and treatment Yes All internal or external pelvic floor assessments and/or treatments are completed with proper hand hygiene and gloves hands. If needed gloves are changed with hand hygiene during patient care time.  PELVIC MMT:   MMT eval  Vaginal 2/5  Internal Anal Sphincter   External Anal Sphincter   Puborectalis   (Blank rows  = not tested)        TONE: average  PROLAPSE: Mild posterior vaginal wall laxity and stool in rectum, bladder descent with cough  TODAY'S TREATMENT:                                                                                                                              DATE:  02/13/2024 Hooklying feet support on white bolter + airex and diaphragmatic breathing x 10 Hooklying feet support on white bolter + airex and pelvic floor activation x 10 Seated adductor ball squeeze + transverse abdominis contraction + diaphragmatic breathing 2x10  Seated hip abduction (GTB) + TA activation 2x10  Seated hip flexion(GTB) + TA activation 2x10  Sit to stand holding [place] x 12 Seated internal rotation + adductor ball squeeze + diaphragmatic breathing 2x12 alternating sides    02/04/24: Toileting mechanics for optimal emptying of bladder and bowels  Blow as you go technique (balloon breathing) Double voiding technique  Relaxing when urinating rather than straining to push the urine out  Bridge + adductor ball squeeze + transverse abdominis contraction 2x10  Seated adductor ball squeeze + transverse abdominis contraction + diaphragmatic breathing 2x10  Seated internal rotation + adductor ball squeeze + diaphragmatic breathing 2x12 alternating sides  Seated hip abduction (GTB) + diaphragmatic breathing 2x10 - I forgot to give her a band for this- she might ask for one!  01/26/2024 Hooklying TA activation + pelvic floor activation x 10 Hooklying TA activation + hip adduction ball squeeze x 10 Hooklying TA activation + unilateral hip abduction with yellow loop x 10 bilateral  Hooklying TA activation + bent knee fallout with yellow loop 2 x 10 bilateral  Supine bridge 2 x 10 Hip internal rotation x 10 each side 5 sec hold Unilateral butterfly stretch 2 x 20 sec bilateral  Quadruped hip adductor rocking x 10 bilateral   01/19/2024 Patient was late to appointment Update on status Sidelying  clamshell with red loop 2 x 10 bilateral (patient had increased calf pain so decreased to yellow but she felt it was still challenging so took away band)  Supine bridge 2 x 10 Hooklying march with TA activation x 20 total   PATIENT EDUCATION:  Education details: Pt was educated on relevant anatomy, exam findings, home exercise program, plan of care, expectations of PT and pressure management exercises with exhale  Person educated: Patient Education method: Explanation, Demonstration, Tactile cues, Verbal cues, and Handouts Education comprehension: verbalized understanding, returned demonstration, verbal cues required, tactile cues required, and needs further education  HOME EXERCISE PROGRAM: Access  Code: [email or web address] URL: [email or web address] Date: 01/26/2024 Prepared by: Kristeen Sar  Exercises - Diaphragmatic Breathing to Reduce Intra-abdominal Pressure: Lifting a Basket  - 1 x daily - 7 x weekly - 2 sets - 10 reps - Diaphragmatic Breathing to Reduce Intra-abdominal Pressure: Sit to Stand  - 1 x daily - 7 x weekly - 2 sets - 10 reps - Horizontal abd with TB with TRA breath  - 1 x daily - 7 x weekly - 2 sets - 10 reps - Pelvic Floor Muscle Contraction and Pelvic Tilt With Hips Elevated (for Pelvic Organ Prolapse)  - 1 x daily - 7 x weekly - 2 sets - 10 reps - Pelvic Floor Muscle Contraction and Adductor Squeeze With Hips Elevated (for Pelvic Organ Prolapse)  - 1 x daily - 7 x weekly - 2 sets - 10 reps - Sit to Stand  - 1 x daily - 7 x weekly - 1-2 sets - 10 reps - Supine Hip Internal and External Rotation  - 1 x daily - 7 x weekly - 1 sets - 10 reps - 5 hold - Kneeling Adductor Stretch with Hip External Rotation  - 1 x daily - 7 x weekly - 2 sets - 10 reps - Unilateral Supported Supine Butterfly Stretch  - 1 x daily - 7 x weekly - 2 sets - 20-30 hold  ASSESSMENT:  CLINICAL IMPRESSION: Patient presents with no current pain. Over the last few days she verbalized increased  pelvic pressure/ heaviness and pain. She has recently started her period since being pregnant. She has an OB appointment scheduled for Jan 5th. Educated patient to perform diaphragmatic breathing with her feet elevated on her couch. We performed this today along with hip strengthening exercises. PT provided occasional cues for proper breathing technique and core activation. Overall, patient tolerated treatment session well and should continue progressing well with skilled therapy.    Patient is a 29 y.o. F who was seen today for physical therapy evaluation and treatment for pelvic pressure and urge incontinence. She also has significant low back pain. Patient reports that symptoms have been bothersome. Patient reports that she feels like her organs are going to fall out. Exam findings are notable for upper chest breathing strategies, abdominal weakness with doming, pelvic floor muscle weakness, average tone in pelvic floor. External soft tissues of pelvic floor appear dry with signs of decreased estrogen. Patient demonstrates fair trunk mobility, bilateral hip weakness  and pain in her back. It is difficult for patient to lift boxes at work and baby due to weakness, she compensates with breath holding and increases pressure on her pelvic floor . Discussed findings with patient, educated patient on pressure management strategies, practices some in the clinic with thera band and baby and HEP was initiated. Patient's quality of life has been affected, patient is frustrated and will benefit from physical therapy to address deficits, reduce pelvic pressure, low back pain and urge incontinence and improve quality of life.    OBJECTIVE IMPAIRMENTS: decreased activity tolerance, decreased coordination, decreased endurance, decreased ROM, decreased strength, increased muscle spasms, impaired tone, and pain.   ACTIVITY LIMITATIONS: carrying, lifting, bending, standing, transfers, continence, toileting, and caring for  others  PARTICIPATION LIMITATIONS: community activity and occupation  PERSONAL FACTORS: Time since onset of injury/illness/exacerbation are also affecting patient's functional outcome.   REHAB POTENTIAL: Good  CLINICAL DECISION MAKING: Evolving/moderate complexity  EVALUATION COMPLEXITY: Moderate   GOALS: Goals reviewed with patient? Yes  SHORT TERM GOALS: Target date: 01/02/2024  Pt will be independent with HEP.   Baseline: Goal status: MET 01/13/2024  2.  Pt will be independent with use of squatty potty, relaxed toileting mechanics, and improved bowel movement techniques in order to increase ease of bowel movements and complete evacuation.   Baseline:  Goal status: IN PROGRESS 01/13/2024  3.   Pt will demonstrate appropriate lateral rib cage excursion with inhale to ensure better abdominal pressure management and pelvic floor/abdominal muscle relaxation without overusing upper traps in order to demonstrate better pressure management strategies with lifting at work and baby Baseline:  Goal status: IN PROGRESS (still needs curing on breathing technique) 01/13/2024   LONG TERM GOALS: Target date: 06/03/2024  Pt will be independent with advanced HEP.   Baseline:  Goal status: IN PROGRESS 01/13/2024  2.  Pt will soak 0 pads/ day in order to run errands and not have to interrupt daily tasks.  Baseline:  Goal status: MET 01/13/2024  3.  Pt will have reduced low back pain to max 1/10 in order to be able to do functional activities such as bending, lifting, twisting and walking as needed to be able to take care of her family and participate in job duties.  Baseline:  Goal status: IN PROGRESS 01/13/2024  4.  Patient will be able to stand and walk at work for 12 hours without increased low back pain Baseline:  Goal status: IN PROGRESS (pain starts around 3 hours) 01/13/2024  5.  Patient will demonstrate bilateral hip pain 5/5 in order to increase lumbopelvic  support Baseline:  Goal status:IN PROGRESS 01/13/2024    PLAN:  PT FREQUENCY: 1-2x/week  PT DURATION: 6 months  PLANNED INTERVENTIONS: 97110-Therapeutic exercises, 97530- Therapeutic activity, 97112- Neuromuscular re-education, 97535- Self Care, 02859- Manual therapy, [phone]- Electrical stimulation (manual), [phone]- Ionotophoresis 4mg /ml Dexamethasone, 79439 (1-2 muscles), 20561 (3+ muscles)- Dry Needling, Patient/Family education, Taping, Joint mobilization, Joint manipulation, Spinal manipulation, Spinal mobilization, Scar mobilization, Cryotherapy, Moist heat, and Biofeedback  PLAN FOR NEXT SESSION  focus on hip and abdominal strengthening with exhale on exertion to reduce pressure on pelvic floor- nustep, back strengthening, VC's to manage pressures   Kristeen Sar, PT, DPT 02/13/24 11:50 AM [facility] [address], [address] [place], [place] [address] Phone # [phone] Fax [phone]      "

## 2024-02-19 ENCOUNTER — Ambulatory Visit: Admitting: "Endocrinology

## 2024-02-19 ENCOUNTER — Encounter: Payer: Self-pay | Admitting: "Endocrinology

## 2024-02-19 VITALS — BP 110/80 | HR 84 | Ht 63.0 in | Wt 176.0 lb

## 2024-02-19 DIAGNOSIS — E1165 Type 2 diabetes mellitus with hyperglycemia: Secondary | ICD-10-CM

## 2024-02-19 DIAGNOSIS — E7800 Pure hypercholesterolemia, unspecified: Secondary | ICD-10-CM

## 2024-02-19 DIAGNOSIS — Z794 Long term (current) use of insulin: Secondary | ICD-10-CM

## 2024-02-19 MED ORDER — NOVOLOG FLEXPEN 100 UNIT/ML ~~LOC~~ SOPN: 1.0000 [IU] | PEN_INJECTOR | Freq: Three times a day (TID) | SUBCUTANEOUS | 1 refills | Status: AC

## 2024-02-19 NOTE — Addendum Note (Signed)
 Addended by: Martin Belling on: 02/19/2024 09:51 AM   Modules accepted: Level of Service

## 2024-02-19 NOTE — Patient Instructions (Addendum)
 Will recommend the following: Lantus  54 units qam Novolog  3 units/meal 15 min before meal Noovlog scale: Use in addition to your meal time/short acting insulin  based on blood sugars as follows:  151 - 180: 1 unit 181 - 210: 2 units 211 - 240: 3 units 241 - 270: 4 units 271 - 300: 5 units 301 - 330: 6 units 331 - 360: 7 units 361 - 390: 8 units 391 - 420: 9 units   Cut down Novolog  insulin  to half if eating only half a meal or if blood sugar is between 71-100 before a meal Skip Novolog  insulin  if blood sugar is less than 70 and treat with 15 gms of carbohydrates every 15 min until blood sugar is more than 100   ___________________   Goals of DM therapy:  Morning Fasting blood sugar: 80-140  Blood sugar before meals: 80-140 Bed time blood sugar: 100-150  A1C <7%, limited only by hypoglycemia  1.Diabetes medications and their side effects discussed, including hypoglycemia    2. Check blood glucose:  a) Always check blood sugars before driving. Please see below (under hypoglycemia) on how to manage b) Check a minimum of 3 times/day or more as needed when having symptoms of hypoglycemia.   c) Try to check blood glucose before sleeping/in the middle of the night to ensure that it is remaining stable and not dropping less than 100 d) Check blood glucose more often if sick  3. Diet: a) 3 meals per day schedule b: Restrict carbs to 60-70 grams (4 servings) per meal c) Colorful vegetables - 3 servings a day, and low sugar fruit 2 servings/day Plate control method: 1/4 plate protein, 1/4 starch, 1/2 green, yellow, or red vegetables d) Avoid carbohydrate snacks unless hypoglycemic episode, or increased physical activity  4. Regular exercise as tolerated, preferably 3 or more hours a week  5. Hypoglycemia: a)  Do not drive or operate machinery without first testing blood glucose to assure it is over 90 mg%, or if dizzy, lightheaded, not feeling normal, etc, or  if foot or leg is  numb or weak. b)  If blood glucose less than 70, take four 5gm Glucose tabs or 15-30 gm Glucose gel.  Repeat every 15 min as needed until blood sugar is >100 mg/dl. If hypoglycemia persists then call 911.   6. Sick day management: a) Check blood glucose more often b) Continue usual therapy if blood sugars are elevated.   7. Contact the doctor immediately if blood glucose is frequently <60 mg/dl, or an episode of severe hypoglycemia occurs (where someone had to give you glucose/  glucagon or if you passed out from a low blood glucose), or if blood glucose is persistently >350 mg/dl, for further management  8. A change in level of physical activity or exercise and a change in diet may also affect your blood sugar. Check blood sugars more often and call if needed.  Instructions: 1. Bring glucose meter, blood glucose records on every visit for review 2. Continue to follow up with primary care physician and other providers for medical care 3. Yearly eye  and foot exam 4. Please get blood work done prior to the next appointment        VISIT SUMMARY: Today, we discussed the management of your blood sugar levels. You have a history of gestational diabetes that has progressed to type 2 diabetes. Your current HbA1c level indicates that your diabetes is not well controlled. We reviewed your current medications and made  some adjustments to help improve your blood sugar levels.  YOUR PLAN: -UNCONTROLLED TYPE 2 DIABETES MELLITUS: Type 2 diabetes is a condition where your body does not use insulin  properly, leading to high blood sugar levels. Your HbA1c level of 10.3 indicates that your diabetes is not well controlled. We will continue your current dose of Lantus  at 54 units. We have also started you on Novolog , which you should take 3 units before each meal, 15 minutes prior to eating. Additionally, we provided you with a correction scale for Novolog : take 1 unit if your blood sugar is between 151-180  mg/dL, and 2 units if it is between 181-210 mg/dL. Please monitor your blood sugar regularly and adjust your insulin  as needed. We have sent your Novolog  prescription to [facility]. We also provided you with diabetes education resources and recommended a follow-up with a dietitian if needed.  INSTRUCTIONS: Please monitor your blood sugar levels regularly and adjust your insulin  doses as instructed. Follow up with a dietitian if you need additional support with your diet. If you experience any new symptoms or have concerns, please contact our office.    Contains text generated by Abridge.

## 2024-02-19 NOTE — Progress Notes (Signed)
 "   Outpatient Endocrinology Note Terri Birmingham, MD  02/19/24   Terri Ramirez 11-Dec-1995 [id number]  Referring Provider: Allwardt, Mardy HERO, PA-C Primary Care Provider: Allwardt, Mardy HERO, PA-C Reason for consultation: Subjective   Assessment & Plan  Diagnoses and all orders for this visit:  Uncontrolled type 2 diabetes mellitus with hyperglycemia (HCC) -     Lipid panel  Long-term insulin  use (HCC)  Insulin  dose changed (HCC)  Other orders -     insulin  aspart (NOVOLOG  FLEXPEN) 100 UNIT/ML FlexPen; Inject 1-10 Units into the skin 3 (three) times daily with meals.    Diabetes Type II complicated by hyperglycemia,  Lab Results  Component Value Date   GFR 126.52 01/05/2024   Hba1c goal less than 7, current Hba1c is  Lab Results  Component Value Date   HGBA1C 10.4 (H) 01/05/2024   Will recommend the following: Lantus  54 units qam Novolog  3 units/meal 15 min before meal Noovlog scale: Use in addition to your meal time/short acting insulin  based on blood sugars as follows:  151 - 180: 1 unit 181 - 210: 2 units 211 - 240: 3 units 241 - 270: 4 units 271 - 300: 5 units 301 - 330: 6 units 331 - 360: 7 units 361 - 390: 8 units 391 - 420: 9 units   Cut down Novolog  insulin  to half if eating only half a meal or if blood sugar is between 71-100 before a meal Skip Novolog  insulin  if blood sugar is less than 70 and treat with 15 gms of carbohydrates every 15 min until blood sugar is more than 100   No known contraindications/side effects to any of above medications Glucagon discussed and prescribed with refills on 02/19/24   -Last LD and Tg are as follows: Lab Results  Component Value Date   LDLCALC 87 03/12/2022    Lab Results  Component Value Date   TRIG 66.0 03/12/2022   -not on statin  -Follow low fat diet and exercise   -Blood pressure goal <140/90 - Microalbumin/creatinine goal is < 30 -Last MA/Cr is as follows: Lab Results  Component Value Date    MICROALBUR <0.7 10/02/2023   -not on ACE/ARB  -diet changes including salt restriction -limit eating outside -counseled BP targets per standards of diabetes care -uncontrolled blood pressure can lead to retinopathy, nephropathy and cardiovascular and atherosclerotic heart disease  Reviewed and counseled on: -A1C target -Blood sugar targets -Complications of uncontrolled diabetes  -Checking blood sugar before meals and bedtime and bring log next visit -All medications with mechanism of action and side effects -Hypoglycemia management: rule of 15's, Glucagon Emergency Kit and medical alert ID -low-carb low-fat plate-method diet -At least 20 minutes of physical activity per day -Annual dilated retinal eye exam and foot exam -compliance and follow up needs -follow up as scheduled or earlier if problem gets worse  Call if blood sugar is less than 70 or consistently above 250    Take a 15 gm snack of carbohydrate at bedtime before you go to sleep if your blood sugar is less than 100.    If you are going to fast after midnight for a test or procedure, ask your physician for instructions on how to reduce/decrease your insulin  dose.    Call if blood sugar is less than 70 or consistently above 250  -Treating a low sugar by rule of 15  (15 gms of sugar every 15 min until sugar is more than 70) If you feel your sugar is  low, test your sugar to be sure If your sugar is low (less than 70), then take 15 grams of a fast acting Carbohydrate (3-4 glucose tablets or glucose gel or 4 ounces of juice or regular soda) Recheck your sugar 15 min after treating low to make sure it is more than 70 If sugar is still less than 70, treat again with 15 grams of carbohydrate          Don't drive the hour of hypoglycemia  If unconscious/unable to eat or drink by mouth, use glucagon injection or nasal spray baqsimi and call 911. Can repeat again in 15 min if still unconscious.  No follow-ups on file.   I have  reviewed current medications, nurse's notes, allergies, vital signs, past medical and surgical history, family medical history, and social history for this encounter. Counseled patient on symptoms, examination findings, lab findings, imaging results, treatment decisions and monitoring and prognosis. The patient understood the recommendations and agrees with the treatment plan. All questions regarding treatment plan were fully answered.  Terri Birmingham, MD  02/19/24    History of Present Illness Terri Ramirez is a 29 y.o. year old female who presents for evaluation of Type II diabetes mellitus.  Discussed the use of AI scribe software for clinical note transcription with the patient, who gave verbal consent to proceed.  History of Present Illness She is accompanied by her 71-month-old child.  She had gestational diabetes last year that resolved after delivery, but her HbA1c at six months postpartum was 10.3. She was initially treated with metformin  in pregnancy and is now on insulin  therapy. She takes Lantus , started at 10 units with increases every three days if fasting glucose is not below 100, and is currently at 54 units, usually in the morning but sometimes at night due to her prior night shift schedule. She uses a continuous glucose monitor and notes high postprandial glucose. Metformin  500 mg daily was stopped because of gastrointestinal side effects, though she previously tolerated 1000 mg during pregnancy.  She denies cardiovascular disease, neuropathy, retinopathy, kidney disease, polyuria, unintentional weight loss, or blurry vision, though she reports weight gain.  Over the past few days she has had severe migraines similar to those during pregnancy when she had high blood pressure. Ibuprofen  800 mg gave minimal relief. She is concerned about her blood pressure but has not checked it.  Her diet is mainly protein and vegetables with minimal starch. She denies juices or sodas. She  drinks water and occasionally uses Liquid IV while breastfeeding her 62-month-old child. She is currently unemployed and previously worked night shifts.    Terri Ramirez was first diagnosed in 2025.   Diabetes education +  Home diabetes regimen: Lantus  54 units qam    COMPLICATIONS -  MI/Stroke -  retinopathy -  neuropathy -  nephropathy  SYMPTOMS REVIEWED - Polyuria - Weight loss - Blurred vision  BLOOD SUGAR DATA                   CGM interpretation: At today's visit, we reviewed her CGM downloads. The full report is scanned in the media. Reviewing the CGM trends, BG are mostly elevated, specially with meals and improve in between/morning.    Physical Exam  BP 110/80   Pulse 84   Ht 5' 3 (1.6 m)   Wt 176 lb (79.8 kg)   LMP 12/29/2023 (Exact Date)   SpO2 96%   BMI 31.18 kg/m    Constitutional: well developed, well nourished  Head: normocephalic, atraumatic Eyes: sclera anicteric, no redness Neck: supple Lungs: normal respiratory effort Neurology: alert and oriented Skin: dry, no appreciable rashes Musculoskeletal: no appreciable defects Psychiatric: normal mood and affect Diabetic Foot Exam - Simple   No data filed      Current Medications Patient's Medications  New Prescriptions   INSULIN  ASPART (NOVOLOG  FLEXPEN) 100 UNIT/ML FLEXPEN    Inject 1-10 Units into the skin 3 (three) times daily with meals.  Previous Medications   ACETAMINOPHEN  (TYLENOL ) 500 MG TABLET    Take 500 mg by mouth every 6 (six) hours as needed.   CONTINUOUS GLUCOSE SENSOR (FREESTYLE LIBRE 3 PLUS SENSOR) MISC    Change sensor every 15 days.   ESCITALOPRAM  (LEXAPRO ) 10 MG TABLET    Take 1 tablet (10 mg total) by mouth daily.   IBUPROFEN  (ADVIL ) 600 MG TABLET    Take 600 mg by mouth every 6 (six) hours as needed.   IBUPROFEN  (ADVIL ) 800 MG TABLET    Take 1 tablet (800 mg total) by mouth every 8 (eight) hours as needed.   INSULIN  GLARGINE (LANTUS ) 100 UNIT/ML SOLOSTAR PEN     Take 10 units, increase by 2 units every 3 days until fasting blood sugars are under 120 units.   INSULIN  PEN NEEDLE (EMBECTA PEN NEEDLE NANO 2 GEN) 32G X 4 MM MISC    USE AS DIRECTED   PRENATAL VIT-FE FUMARATE-FA (PRENATAL MULTIVITAMIN) TABS TABLET    Take 1 tablet by mouth daily at 12 noon.  Modified Medications   No medications on file  Discontinued Medications   No medications on file    Allergies Allergies[1]  Past Medical History Past Medical History:  Diagnosis Date   Anxiety    Gestational diabetes    Low blood pressure    Low sodium levels     Past Surgical History History reviewed. No pertinent surgical history.  Family History She was adopted. Family history is unknown by patient.  Social History Social History   Socioeconomic History   Marital status: Single    Spouse name: Not on file   Number of children: Not on file   Years of education: Not on file   Highest education level: Some college, no degree  Occupational History   Not on file  Tobacco Use   Smoking status: Never   Smokeless tobacco: Never  Vaping Use   Vaping status: Never Used  Substance and Sexual Activity   Alcohol use: Not Currently   Drug use: Not Currently    Types: Marijuana    Comment: none during preg   Sexual activity: Not Currently  Other Topics Concern   Not on file  Social History Narrative   Not on file   Social Drivers of Health   Tobacco Use: Low Risk (02/19/2024)   Patient History    Smoking Tobacco Use: Never    Smokeless Tobacco Use: Never    Passive Exposure: Not on file  Financial Resource Strain: Low Risk (10/02/2023)   Overall Financial Resource Strain (CARDIA)    Difficulty of Paying Living Expenses: Not hard at all  Food Insecurity: No Food Insecurity (10/03/2023)   Epic    Worried About Programme Researcher, Broadcasting/film/video in the Last Year: Never true    Ran Out of Food in the Last Year: Never true  Transportation Needs: No Transportation Needs (10/02/2023)   Epic     Lack of Transportation (Medical): No    Lack of Transportation (Non-Medical): No  Physical Activity:  Sufficiently Active (10/02/2023)   Exercise Vital Sign    Days of Exercise per Week: 7 days    Minutes of Exercise per Session: 30 min  Stress: No Stress Concern Present (10/02/2023)   [employer] of Occupational Health - Occupational Stress Questionnaire    Feeling of Stress: Only a little  Social Connections: Moderately Integrated (10/02/2023)   Social Connection and Isolation Panel    Frequency of Communication with Friends and Family: Three times a week    Frequency of Social Gatherings with Friends and Family: Never    Attends Religious Services: More than 4 times per year    Active Member of Clubs or Organizations: Yes    Attends Banker Meetings: More than 4 times per year    Marital Status: Never married  Intimate Partner Violence: Not At Risk (03/14/2023)   Humiliation, Afraid, Rape, and Kick questionnaire    Fear of Current or Ex-Partner: No    Emotionally Abused: No    Physically Abused: No    Sexually Abused: No  Depression (PHQ2-9): Low Risk (01/05/2024)   Depression (PHQ2-9)    PHQ-2 Score: 0  Alcohol Screen: Not on file  Housing: High Risk (10/02/2023)   Epic    Unable to Pay for Housing in the Last Year: Yes    Number of Times Moved in the Last Year: 0    Homeless in the Last Year: No  Utilities: Not At Risk (03/14/2023)   [facility] Utilities    Threatened with loss of utilities: No  Health Literacy: Not on file    Lab Results  Component Value Date   HGBA1C 10.4 (H) 01/05/2024   HGBA1C 10.3 10/01/2023   HGBA1C 5.6 03/12/2022   Lab Results  Component Value Date   CHOL 158 03/12/2022   Lab Results  Component Value Date   HDL 58.10 03/12/2022   Lab Results  Component Value Date   LDLCALC 87 03/12/2022   Lab Results  Component Value Date   TRIG 66.0 03/12/2022   Lab Results  Component Value Date   CHOLHDL 3 03/12/2022   Lab Results   Component Value Date   CREATININE 0.54 01/05/2024   Lab Results  Component Value Date   GFR 126.52 01/05/2024   Lab Results  Component Value Date   MICROALBUR <0.7 10/02/2023      Component Value Date/Time   NA 133 (L) 01/05/2024 1156   K 3.9 01/05/2024 1156   CL 99 01/05/2024 1156   CO2 26 01/05/2024 1156   GLUCOSE 326 (H) 01/05/2024 1156   BUN 15 01/05/2024 1156   CREATININE 0.54 01/05/2024 1156   CALCIUM 9.5 01/05/2024 1156   PROT 7.0 01/05/2024 1156   ALBUMIN 4.0 01/05/2024 1156   AST 16 01/05/2024 1156   ALT 20 01/05/2024 1156   ALKPHOS 126 (H) 01/05/2024 1156   BILITOT 0.6 01/05/2024 1156   GFRNONAA >60 10/19/2023 2027   GFRAA >60 11/17/2018 1410      Latest Ref Rng & Units 01/05/2024   11:56 AM 10/19/2023    8:27 PM 10/11/2023    9:36 PM  BMP  Glucose 70 - 99 mg/dL 673  730  638   BUN 6 - 23 mg/dL 15  10  11    Creatinine 0.40 - 1.20 mg/dL 9.45  9.53  9.42   Sodium 135 - 145 mEq/L 133  134  135   Potassium 3.5 - 5.1 mEq/L 3.9  3.9  3.9   Chloride 96 - 112  mEq/L 99  97  101   CO2 19 - 32 mEq/L 26  22  23    Calcium 8.4 - 10.5 mg/dL 9.5  9.2  9.7        Component Value Date/Time   WBC 7.1 01/05/2024 1156   RBC 4.43 01/05/2024 1156   HGB 13.6 01/05/2024 1156   HCT 40.1 01/05/2024 1156   PLT 260.0 01/05/2024 1156   MCV 90.5 01/05/2024 1156   MCH 30.5 10/19/2023 2027   MCHC 34.0 01/05/2024 1156   RDW 13.0 01/05/2024 1156   LYMPHSABS 2.3 01/05/2024 1156   MONOABS 0.4 01/05/2024 1156   EOSABS 0.1 01/05/2024 1156   BASOSABS 0.0 01/05/2024 1156     Parts of this note may have been dictated using voice recognition software. There may be variances in spelling and vocabulary which are unintentional. Not all errors are proofread. Please notify the dino if any discrepancies are noted or if the meaning of any statement is not clear.      [1] No Known Allergies  "

## 2024-02-20 ENCOUNTER — Other Ambulatory Visit (HOSPITAL_COMMUNITY): Payer: Self-pay

## 2024-02-20 ENCOUNTER — Telehealth: Payer: Self-pay

## 2024-02-20 NOTE — Telephone Encounter (Signed)
 Pharmacy Patient Advocate Encounter   Received notification from Pt Calls Messages that prior authorization for Novolog  is required/requested.   Insurance verification completed.   The patient is insured through [employer].   Per test claim:  Humalog is preferred by the insurance.  If suggested medication is appropriate, Please send in a new RX and discontinue this one. If not, please advise as to why it's not appropriate so that we may request a Prior Authorization. Please note, some preferred medications may still require a PA.  If the suggested medications have not been trialed and there are no contraindications to their use, the PA will not be submitted, as it will not be approved. Archived Key:  [id number]   Copay for Humalog is $0

## 2024-02-20 NOTE — Telephone Encounter (Signed)
 Pt needs PA done for Novolog .

## 2024-02-24 ENCOUNTER — Ambulatory Visit: Admitting: Physical Therapy

## 2024-03-03 ENCOUNTER — Ambulatory Visit: Admitting: Physical Therapy

## 2024-03-09 ENCOUNTER — Ambulatory Visit: Admitting: Physical Therapy

## 2024-03-24 ENCOUNTER — Ambulatory Visit: Admitting: "Endocrinology

## 2024-04-06 ENCOUNTER — Ambulatory Visit: Admitting: Physician Assistant
